# Patient Record
Sex: Female | Born: 1970 | Race: White | Hispanic: No | Marital: Married | Smoking: Never smoker
Health system: Southern US, Community
[De-identification: ages and names within clinical notes are randomized; demographics above are authoritative.]

## PROBLEM LIST (undated history)

## (undated) DIAGNOSIS — I10 Essential (primary) hypertension: Secondary | ICD-10-CM

## (undated) DIAGNOSIS — E039 Hypothyroidism, unspecified: Secondary | ICD-10-CM

## (undated) DIAGNOSIS — Z803 Family history of malignant neoplasm of breast: Secondary | ICD-10-CM

## (undated) DIAGNOSIS — M1990 Unspecified osteoarthritis, unspecified site: Secondary | ICD-10-CM

## (undated) HISTORY — DX: Family history of malignant neoplasm of breast: Z80.3

---

## 1999-10-01 ENCOUNTER — Other Ambulatory Visit: Admission: RE | Admit: 1999-10-01 | Discharge: 1999-10-01 | Payer: Self-pay | Admitting: Obstetrics and Gynecology

## 2000-10-16 ENCOUNTER — Other Ambulatory Visit: Admission: RE | Admit: 2000-10-16 | Discharge: 2000-10-16 | Payer: Self-pay | Admitting: Obstetrics and Gynecology

## 2001-10-19 ENCOUNTER — Other Ambulatory Visit: Admission: RE | Admit: 2001-10-19 | Discharge: 2001-10-19 | Payer: Self-pay | Admitting: Obstetrics and Gynecology

## 2004-10-05 ENCOUNTER — Emergency Department (HOSPITAL_COMMUNITY): Admission: EM | Admit: 2004-10-05 | Discharge: 2004-10-05 | Payer: Self-pay | Admitting: Family Medicine

## 2005-10-06 ENCOUNTER — Encounter: Admission: RE | Admit: 2005-10-06 | Discharge: 2005-10-06 | Payer: Self-pay | Admitting: Endocrinology

## 2006-02-14 ENCOUNTER — Emergency Department (HOSPITAL_COMMUNITY): Admission: EM | Admit: 2006-02-14 | Discharge: 2006-02-14 | Payer: Self-pay | Admitting: Emergency Medicine

## 2006-03-02 ENCOUNTER — Encounter: Admission: RE | Admit: 2006-03-02 | Discharge: 2006-03-02 | Payer: Self-pay | Admitting: Family Medicine

## 2013-09-09 ENCOUNTER — Other Ambulatory Visit (INDEPENDENT_AMBULATORY_CARE_PROVIDER_SITE_OTHER): Payer: BC Managed Care – PPO

## 2013-09-09 ENCOUNTER — Ambulatory Visit (INDEPENDENT_AMBULATORY_CARE_PROVIDER_SITE_OTHER): Payer: BC Managed Care – PPO | Admitting: Endocrinology

## 2013-09-09 ENCOUNTER — Encounter: Payer: Self-pay | Admitting: Endocrinology

## 2013-09-09 VITALS — BP 110/70 | HR 93 | Temp 98.2°F | Resp 12 | Ht 63.0 in | Wt 240.7 lb

## 2013-09-09 DIAGNOSIS — R7309 Other abnormal glucose: Secondary | ICD-10-CM

## 2013-09-09 DIAGNOSIS — E039 Hypothyroidism, unspecified: Secondary | ICD-10-CM

## 2013-09-09 DIAGNOSIS — E282 Polycystic ovarian syndrome: Secondary | ICD-10-CM

## 2013-09-09 DIAGNOSIS — E785 Hyperlipidemia, unspecified: Secondary | ICD-10-CM

## 2013-09-09 DIAGNOSIS — R7303 Prediabetes: Secondary | ICD-10-CM

## 2013-09-09 LAB — COMPREHENSIVE METABOLIC PANEL
ALT: 23 U/L (ref 0–35)
Albumin: 4.2 g/dL (ref 3.5–5.2)
CO2: 27 mEq/L (ref 19–32)
Calcium: 9.8 mg/dL (ref 8.4–10.5)
Chloride: 102 mEq/L (ref 96–112)
Creatinine, Ser: 0.7 mg/dL (ref 0.4–1.2)
GFR: 93.08 mL/min (ref >60.00)
Glucose, Bld: 109 mg/dL — ABNORMAL HIGH (ref 70–99)
Sodium: 139 mEq/L (ref 135–145)
Total Bilirubin: 0.6 mg/dL (ref 0.3–1.2)
Total Protein: 7.7 g/dL (ref 6.0–8.3)

## 2013-09-09 LAB — LIPID PANEL
Cholesterol: 173 mg/dL (ref 0–200)
LDL Cholesterol: 102 mg/dL — ABNORMAL HIGH (ref 0–99)
Total CHOL/HDL Ratio: 5
VLDL: 34.2 mg/dL (ref 0.0–40.0)

## 2013-09-09 LAB — HEMOGLOBIN A1C: Hgb A1c MFr Bld: 5.5 % (ref 4.6–6.5)

## 2013-09-09 NOTE — Progress Notes (Signed)
Patient ID: Nicole Schwartz, female   DOB: 1971/02/05, 42 y.o.   MRN: [id number]  Nicole Schwartz is a 42 y.o. female.   Chief complaint: Followup of various conditions  History of Present Illness:  HYPERTENSION: She has had hypertension since at least 2004. Has been on ACE inhibitor since about 2005 Now requiring relatively low doses of medications. On her last visit she was having some swelling of her legs and her ACE inhibitor was changed to low-dose Lotensin HCTZ. Blood pressure at work was 115/70. She has no edema now with new medication  HYPOTHYROIDISM: She has had a goiter since at least 1998. Baseline TSH was 5.4 when she was started on levothyroxine in 2000 and has required small doses of supplement  PCOS.: She had oligomenorrhea and was treated with metformin since about 2002. She went off medications about 2 years ago and still continues to have regular menstrual cycles. Her last free testosterone level was normal  OBESITY: She has had difficulty losing weight, currently not exercising consistently. She follows a low-fat diet off and on. Has not been previously on weight loss medications  HYPERLIPIDEMIA: She has had diabetic dyslipidemia with HDL as low as 23 and high triglycerides. Previously on Crestor and fenofibrate combination, subsequently levels were better without treatment    Medication List       This list is accurate as of: 09/09/13 10:07 AM.  Always use your most recent med list.               benazepril-hydrochlorthiazide 5-6.25 MG per tablet  Commonly known as:  LOTENSIN HCT     SYNTHROID 75 MCG tablet  Generic drug:  levothyroxine  75 tablets.        Allergies: No Known Allergies  No past medical history on file.  No past surgical history on file.  No family history on file.  Social History:  reports that she has never smoked. She has never used smokeless tobacco. Her alcohol and drug histories are not on file.  Review of Systems   No recent  depression  LABS:  No results found for any previous visit.  EXAM:  BP 110/70  Pulse 93  Temp(Src) 98.2 F (36.8 C)  Resp 12  Ht 5\' 3"  (1.6 m)  Wt 240 lb 11.2 oz (109.181 kg)  BMI 42.65 kg/m2  SpO2 97%  LMP 08/21/2013  Does not have any cushingoid features No pedal edema Thyroid is enlarged about twice normal, diffuse, larger on the right side, smooth and rubbery Ankle reflexes normal  Assessment/Plan:   1. Long-standing mild hypothyroidism with goiter: Has been previously on stable doses of thyroid supplementation and will check TSH again to confirm. She is subjectively doing well. Her goiter is about the same as before and not have any local pressure symptoms 2. OBESITY: She has had difficulty losing weight consistently and does have features of metabolic syndrome, more recently has not had any significant hyperglycemia or hyperlipidemia. However she is unable to lose weight on her own. Discussed briefly that she could use Qsymia for weight loss and this can be used long-term especially if she has abnormal glucose or significantly high triglyceride levels 3. Hyperlipidemia: To be reassessed with fasting labs today 4. PCOS.: Has not had a problem with skipped menstrual cycles as before and previous testosterone levels had come down to normal. May consider metformin if fasting glucose is high 5. Hypertension: This is mild and well controlled, she is monitoring periodically at work also. Her edema is  doing better with combination of low-dose ACE inhibitor and HCTZ.  Dantrell Schertzer 09/09/2013, 10:07 AM   Addendum: Glucose 109 fasting, will start metformin ER 750 mg, this may help her weight loss too

## 2013-09-09 NOTE — Patient Instructions (Signed)
Check Qsymia for wt loss

## 2013-09-12 ENCOUNTER — Telehealth: Payer: Self-pay | Admitting: *Deleted

## 2013-09-12 ENCOUNTER — Encounter: Payer: Self-pay | Admitting: Endocrinology

## 2013-09-12 MED ORDER — METFORMIN HCL ER 750 MG PO TB24: 750.0000 mg | ORAL_TABLET | Freq: Every day | ORAL | Status: DC

## 2013-09-12 MED ORDER — BENAZEPRIL-HYDROCHLOROTHIAZIDE 5-6.25 MG PO TABS: 1.0000 | ORAL_TABLET | Freq: Every day | ORAL | Status: DC

## 2013-09-12 NOTE — Telephone Encounter (Signed)
Left message to return call 

## 2013-09-12 NOTE — Telephone Encounter (Signed)
Message copied by Hermenia Bers on Mon Sep 12, 2013  1:39 PM ------      Message from: Reather Littler      Created: Mon Sep 12, 2013  1:10 PM       Only has a high sugar level of 109, the rest normal. Recommend starting metformin ER 750 mg daily to help with sugar and may help with weight. ------

## 2013-11-14 ENCOUNTER — Other Ambulatory Visit: Payer: Self-pay | Admitting: *Deleted

## 2013-11-14 MED ORDER — LEVOTHYROXINE SODIUM 75 MCG PO TABS: 75.0000 ug | ORAL_TABLET | Freq: Every day | ORAL | Status: DC

## 2014-03-07 ENCOUNTER — Other Ambulatory Visit (INDEPENDENT_AMBULATORY_CARE_PROVIDER_SITE_OTHER): Payer: BC Managed Care – PPO

## 2014-03-07 DIAGNOSIS — E039 Hypothyroidism, unspecified: Secondary | ICD-10-CM

## 2014-03-07 LAB — T4, FREE: Free T4: 0.81 ng/dL (ref 0.60–1.60)

## 2014-03-07 LAB — TSH: TSH: 1.6 u[IU]/mL (ref 0.35–5.50)

## 2014-03-10 ENCOUNTER — Ambulatory Visit: Payer: BC Managed Care – PPO

## 2014-03-10 ENCOUNTER — Ambulatory Visit (INDEPENDENT_AMBULATORY_CARE_PROVIDER_SITE_OTHER): Payer: BC Managed Care – PPO | Admitting: Endocrinology

## 2014-03-10 ENCOUNTER — Other Ambulatory Visit: Payer: Self-pay | Admitting: Endocrinology

## 2014-03-10 ENCOUNTER — Encounter: Payer: Self-pay | Admitting: Endocrinology

## 2014-03-10 VITALS — BP 110/64 | HR 96 | Temp 97.7°F | Resp 16 | Ht 63.0 in | Wt 242.6 lb

## 2014-03-10 DIAGNOSIS — R7309 Other abnormal glucose: Secondary | ICD-10-CM

## 2014-03-10 DIAGNOSIS — E1059 Type 1 diabetes mellitus with other circulatory complications: Secondary | ICD-10-CM

## 2014-03-10 DIAGNOSIS — E785 Hyperlipidemia, unspecified: Secondary | ICD-10-CM

## 2014-03-10 DIAGNOSIS — R7303 Prediabetes: Secondary | ICD-10-CM

## 2014-03-10 DIAGNOSIS — E039 Hypothyroidism, unspecified: Secondary | ICD-10-CM

## 2014-03-10 LAB — GLUCOSE, RANDOM: Glucose, Bld: 92 mg/dL (ref 70–99)

## 2014-03-10 NOTE — Progress Notes (Signed)
Patient ID: Nicole Schwartz, female   DOB: [birth year], 43 y.o.   MRN: [id number]    Chief complaint: Followup of various conditions  History of Present Illness:  HYPERTENSION: She has had hypertension since at least 2004. Has been on ACE inhibitor since about 2005 Now requiring relatively low doses of medications. Because of swelling of her legs  her ACE inhibitor was changed to low-dose Lotensin HCTZ. Blood pressure at work is about 116/75. She has no recurrence of edema   IMPAIRED FASTING GLUCOSE: Her glucose was high at 109 on her last visit She was started on metformin ER 750 mg and fasting glucose is pending  HYPOTHYROIDISM: She has had a goiter since at least 1998. Baseline TSH was 5.4 when she was started on levothyroxine in 2000 and has required small doses of levothyroxine with stable doses. No fatigue  Lab Results  Component Value Date   TSH 1.60 03/07/2014    PCOS.: She had oligomenorrhea and was treated with metformin since about 2002. She went off medications about 2 years ago and still continues to have regular menstrual cycles. Her last free testosterone level was normal  OBESITY: She has had difficulty losing weight, currently not exercising and not motivated. She follows a low-fat diet off and on.  Has not been previously on weight loss medications Her weight is not any better with starting metformin  Wt Readings from Last 3 Encounters:  03/10/14 242 lb 9.6 oz (110.043 kg)  09/09/13 240 lb 11.2 oz (109.181 kg)    HYPERLIPIDEMIA: She has had diabetic dyslipidemia with HDL as low as 23 and high triglycerides. Previously on Crestor and fenofibrate combination, subsequently levels were better without treatment  Lab Results  Component Value Date   CHOL 173 09/09/2013   HDL 36.80* 09/09/2013   LDLCALC 102* 09/09/2013   TRIG 171.0* 09/09/2013   CHOLHDL 5 09/09/2013       Medication List       This list is accurate as of: 03/10/14  4:22 PM.  Always use your most  recent med list.               benazepril-hydrochlorthiazide 5-6.25 MG per tablet  Commonly known as:  LOTENSIN HCT  TAKE 1 TABLET BY MOUTH DAILY.     levothyroxine 75 MCG tablet  Commonly known as:  SYNTHROID  Take 1 tablet (75 mcg total) by mouth daily before breakfast.     metFORMIN 750 MG 24 hr tablet  Commonly known as:  GLUCOPHAGE-XR  Take 1 tablet (750 mg total) by mouth daily with breakfast.        Allergies: No Known Allergies  No past medical history on file.  No past surgical history on file.  Family History  Problem Relation Age of Onset  . Thyroid disease Mother   . Diabetes Maternal Grandfather   . Hypertension Maternal Grandfather     Social History:  reports that she has never smoked. She has never used smokeless tobacco. Her alcohol and drug histories are not on file.  Review of Systems   No recent depression   EXAM:  BP 110/64  Pulse 96  Temp(Src) 97.7 F (36.5 C)  Resp 16  Ht 5\' 3"  (1.6 m)  Wt 242 lb 9.6 oz (110.043 kg)  BMI 42.99 kg/m2  SpO2 95%  LMP 02/20/2014   Assessment/Plan:   1. Long-standing mild hypothyroidism with goiter: Has been previously on stable doses of thyroid supplementation and  TSH is again normal on 75 mcg.  Also has small persistent goiter 2. OBESITY: She has had difficulty losing weight consistently and does have features of metabolic syndrome including dyslipidemia and impaired fasting glucose. She is reluctant to start a weight loss medication because of the cost and wants to try harder on her own. 3. Hyperlipidemia: Mild increase in triglycerides and low HDL previously, may improve with weight loss 4. PCOS.: Has not had a problem with skipped menstrual cycles as before and previous testosterone levels had come down to normal.  5. Hypertension: This is mild and well controlled, she is monitoring periodically at work also. Her edema is doing better with combination of low-dose ACE inhibitor and HCTZ.  Elayne Snare 03/10/2014, 4:22 PM   Addendum: Glucose normal

## 2014-03-11 LAB — COMPLETE METABOLIC PANEL WITH GFR
ALK PHOS: 51 U/L (ref 39–117)
ALT: 25 U/L (ref 0–35)
AST: 23 U/L (ref 0–37)
Albumin: 4.2 g/dL (ref 3.5–5.2)
BUN: 12 mg/dL (ref 6–23)
CO2: 19 mEq/L (ref 19–32)
CREATININE: 0.73 mg/dL (ref 0.50–1.10)
Calcium: 9.5 mg/dL (ref 8.4–10.5)
Chloride: 101 mEq/L (ref 96–112)
GFR, Est African American: >89 mL/min
GFR, Est Non African American: >89 mL/min
GLUCOSE: 89 mg/dL (ref 70–99)
Potassium: 4.3 mEq/L (ref 3.5–5.3)
SODIUM: 136 meq/L (ref 135–145)
TOTAL PROTEIN: 7.3 g/dL (ref 6.0–8.3)
Total Bilirubin: 0.4 mg/dL (ref 0.2–1.2)

## 2014-03-12 NOTE — Progress Notes (Signed)
Quick Note:  Please let patient know that the glucose result is back to normal and no further action needed ______

## 2014-05-03 ENCOUNTER — Ambulatory Visit: Payer: BC Managed Care – PPO | Admitting: Dietician

## 2014-06-13 ENCOUNTER — Ambulatory Visit: Payer: BC Managed Care – PPO | Admitting: Dietician

## 2014-07-05 ENCOUNTER — Other Ambulatory Visit (INDEPENDENT_AMBULATORY_CARE_PROVIDER_SITE_OTHER): Payer: BC Managed Care – PPO

## 2014-07-05 DIAGNOSIS — E039 Hypothyroidism, unspecified: Secondary | ICD-10-CM

## 2014-07-05 DIAGNOSIS — R7989 Other specified abnormal findings of blood chemistry: Secondary | ICD-10-CM

## 2014-07-05 DIAGNOSIS — R7303 Prediabetes: Secondary | ICD-10-CM

## 2014-07-05 DIAGNOSIS — R7309 Other abnormal glucose: Secondary | ICD-10-CM

## 2014-07-05 DIAGNOSIS — E785 Hyperlipidemia, unspecified: Secondary | ICD-10-CM

## 2014-07-05 LAB — LIPID PANEL
Cholesterol: 164 mg/dL (ref 0–200)
HDL: 30.8 mg/dL — AB (ref >39.00)
NONHDL: 133.2
Total CHOL/HDL Ratio: 5
Triglycerides: 279 mg/dL — ABNORMAL HIGH (ref 0.0–149.0)
VLDL: 55.8 mg/dL — AB (ref 0.0–40.0)

## 2014-07-05 LAB — COMPREHENSIVE METABOLIC PANEL
ALT: 24 U/L (ref 0–35)
AST: 21 U/L (ref 0–37)
Albumin: 4.1 g/dL (ref 3.5–5.2)
Alkaline Phosphatase: 64 U/L (ref 39–117)
BUN: 11 mg/dL (ref 6–23)
CALCIUM: 9.3 mg/dL (ref 8.4–10.5)
CO2: 26 mEq/L (ref 19–32)
CREATININE: 0.7 mg/dL (ref 0.4–1.2)
Chloride: 100 mEq/L (ref 96–112)
GFR: 97.31 mL/min (ref >60.00)
Glucose, Bld: 94 mg/dL (ref 70–99)
Potassium: 3.7 mEq/L (ref 3.5–5.1)
Sodium: 133 mEq/L — ABNORMAL LOW (ref 135–145)
TOTAL PROTEIN: 7.7 g/dL (ref 6.0–8.3)
Total Bilirubin: 0.8 mg/dL (ref 0.2–1.2)

## 2014-07-05 LAB — LDL CHOLESTEROL, DIRECT: LDL DIRECT: 104 mg/dL

## 2014-07-05 LAB — TSH: TSH: 0.94 u[IU]/mL (ref 0.35–4.50)

## 2014-07-05 LAB — HEMOGLOBIN A1C: HEMOGLOBIN A1C: 5.4 % (ref 4.6–6.5)

## 2014-07-10 ENCOUNTER — Encounter: Payer: Self-pay | Admitting: Endocrinology

## 2014-07-10 ENCOUNTER — Ambulatory Visit (INDEPENDENT_AMBULATORY_CARE_PROVIDER_SITE_OTHER): Payer: BC Managed Care – PPO | Admitting: Endocrinology

## 2014-07-10 VITALS — BP 118/64 | HR 87 | Temp 98.1°F | Resp 16 | Ht 63.0 in | Wt 239.0 lb

## 2014-07-10 DIAGNOSIS — R7309 Other abnormal glucose: Secondary | ICD-10-CM

## 2014-07-10 DIAGNOSIS — R7303 Prediabetes: Secondary | ICD-10-CM

## 2014-07-10 DIAGNOSIS — E039 Hypothyroidism, unspecified: Secondary | ICD-10-CM

## 2014-07-10 DIAGNOSIS — I10 Essential (primary) hypertension: Secondary | ICD-10-CM

## 2014-07-10 DIAGNOSIS — E785 Hyperlipidemia, unspecified: Secondary | ICD-10-CM

## 2014-07-10 NOTE — Progress Notes (Signed)
Patient ID: Nicole Schwartz, female   DOB: May 22, 1971, 43 y.o.   MRN: [id number]    Chief complaint: Followup of various conditions  History of Present Illness:  HYPERTENSION: She has had hypertension since at least 2004. Has been on ACE inhibitor since about 2005 Still requiring relatively low doses of medications.  Because of swelling of her legs her ACE inhibitor was changed to low-dose Lotensin HCTZ. Blood pressure at work is about 110-120/68-70 and is checking this once or twice a week  IMPAIRED FASTING GLUCOSE: Her glucose was high at 109 previously but back to normal now at 94 She was started on metformin ER 750 mg and A1c is excellent Some walking on treadmill 2-3/7 days but has minimal weight loss  Lab Results  Component Value Date   HGBA1C 5.4 07/05/2014   HGBA1C 5.5 09/09/2013   Lab Results  Component Value Date   LDLCALC 102* 09/09/2013   CREATININE 0.7 07/05/2014    HYPOTHYROIDISM: She has had a goiter since at least 1998. Baseline TSH was 5.4 when she was started on levothyroxine in 2000 and has required small doses of levothyroxine with stable doses. No fatigue recently  Lab Results  Component Value Date   FREET4 0.81 03/07/2014   FREET4 0.84 09/09/2013   TSH 0.94 07/05/2014   TSH 1.60 03/07/2014   TSH 1.09 09/09/2013    PCOS.: She had oligomenorrhea and was treated with metformin since about 2002. She went off medications in ? 2012 and still continues to have regular menstrual cycles. Her last free testosterone level was normal  OBESITY: She has had difficulty losing weight, starting to do a little exercise. She follows a low-fat diet off and on.  Has not been previously on weight loss medications and does not want to go on these because of expense   Wt Readings from Last 3 Encounters:  07/10/14 239 lb (108.41 kg)  03/10/14 242 lb 9.6 oz (110.043 kg)  09/09/13 240 lb 11.2 oz (109.181 kg)    HYPERLIPIDEMIA: She has had diabetic dyslipidemia with HDL as low as 23 and  high triglycerides. Previously on Crestor and fenofibrate combination, subsequently levels were better without treatment but triglycerides are significantly higher on this visit Diet usually low fat   Lab Results  Component Value Date   CHOL 164 07/05/2014   HDL 30.80* 07/05/2014   LDLCALC 102* 09/09/2013   LDLDIRECT 104.0 07/05/2014   TRIG 279.0* 07/05/2014   CHOLHDL 5 07/05/2014       Medication List       This list is accurate as of: 07/10/14  4:30 PM.  Always use your most recent med list.               benazepril-hydrochlorthiazide 5-6.25 MG per tablet  Commonly known as:  LOTENSIN HCT  TAKE 1 TABLET BY MOUTH DAILY.     levothyroxine 75 MCG tablet  Commonly known as:  SYNTHROID  Take 1 tablet (75 mcg total) by mouth daily before breakfast.     metFORMIN 750 MG 24 hr tablet  Commonly known as:  GLUCOPHAGE-XR  Take 1 tablet (750 mg total) by mouth daily with breakfast.        Allergies: No Known Allergies  No past medical history on file.  No past surgical history on file.  Family History  Problem Relation Age of Onset  . Thyroid disease Mother   . Diabetes Maternal Grandfather   . Hypertension Maternal Grandfather     Social History:  reports  that she has never smoked. She has never used smokeless tobacco. Her alcohol and drug histories are not on file.  Review of Systems   No recent depression   EXAM:  BP 122/88  Pulse 87  Temp(Src) 98.1 F (36.7 C)  Resp 16  Ht 5\' 3"  (1.6 m)  Wt 239 lb (108.41 kg)  BMI 42.35 kg/m2  SpO2 98%  Thyroid is enlarged diffusely, smooth and rubbery, about 2 a half times normal  Assessment/Plan:   1. Long-standing mild hypothyroidism with goiter: Has been on stable doses of thyroid supplementation and  TSH is again normal on 75 mcg. Also has small persistent goiter 2. OBESITY: She has had difficulty losing weight consistently and does have features of metabolic syndrome including dyslipidemia and impaired fasting  glucose. She is exercising a little but has not had much luck with weight loss yet. 3. Hyperlipidemia: Further increase in triglycerides and low HDL. Will need to get her back on fenofibrate 4. PCOS.: Has not had a problem with skipped menstrual cycles as before and previous testosterone levels had come down to normal.  5. Impaired fasting glucose: Controlled with low dose metformin and will continue. Fasting glucose recently normal 6. Hypertension: This is mild and well controlled, she is monitoring periodically at work also. Her edema is doing better with combination of low-dose ACE inhibitor and HCTZ.  Nicole Schwartz 07/10/2014, 4:30 PM

## 2014-07-10 NOTE — Patient Instructions (Addendum)
Fenofibrate for triglycerides  Walk more

## 2014-07-11 DIAGNOSIS — I10 Essential (primary) hypertension: Secondary | ICD-10-CM | POA: Insufficient documentation

## 2014-07-11 MED ORDER — FENOFIBRATE MICRONIZED 134 MG PO CAPS: 134.0000 mg | ORAL_CAPSULE | Freq: Every day | ORAL | Status: DC

## 2014-09-12 ENCOUNTER — Ambulatory Visit (HOSPITAL_COMMUNITY)
Admission: RE | Admit: 2014-09-12 | Discharge: 2014-09-12 | Disposition: A | Discharge status: Home / Self Care | Payer: BC Managed Care – PPO | Source: Ambulatory Visit | Attending: Physician Assistant | Admitting: Physician Assistant

## 2014-09-12 ENCOUNTER — Other Ambulatory Visit (HOSPITAL_COMMUNITY): Payer: Self-pay | Admitting: Physician Assistant

## 2014-09-12 DIAGNOSIS — M79621 Pain in right upper arm: Secondary | ICD-10-CM | POA: Insufficient documentation

## 2014-09-12 DIAGNOSIS — M79609 Pain in unspecified limb: Secondary | ICD-10-CM

## 2014-09-12 DIAGNOSIS — M25521 Pain in right elbow: Secondary | ICD-10-CM

## 2014-09-12 NOTE — Progress Notes (Signed)
*  PRELIMINARY RESULTS* Vascular Ultrasound Right upper extremity venous duplex has been completed.  Preliminary findings:  No evidence of DVT or superficial thrombosis.    Called results to Ricka Burdock, [facility], RDMS, RVT  09/12/2014, 12:39 PM

## 2014-09-17 ENCOUNTER — Other Ambulatory Visit: Payer: Self-pay | Admitting: Endocrinology

## 2014-09-20 ENCOUNTER — Telehealth: Payer: Self-pay | Admitting: Endocrinology

## 2014-09-20 NOTE — Telephone Encounter (Signed)
Amber from [facility] blood pressure medication can't refill now they may have to give for now can't get milligram in stock been trying to order the past 3 days [phone]

## 2014-09-21 ENCOUNTER — Other Ambulatory Visit: Payer: Self-pay | Admitting: *Deleted

## 2014-09-21 MED ORDER — LISINOPRIL-HYDROCHLOROTHIAZIDE 10-12.5 MG PO TABS: 1.0000 | ORAL_TABLET | Freq: Every day | ORAL | Status: DC

## 2014-11-06 ENCOUNTER — Other Ambulatory Visit (INDEPENDENT_AMBULATORY_CARE_PROVIDER_SITE_OTHER): Payer: BC Managed Care – PPO

## 2014-11-06 DIAGNOSIS — I10 Essential (primary) hypertension: Secondary | ICD-10-CM

## 2014-11-06 DIAGNOSIS — E785 Hyperlipidemia, unspecified: Secondary | ICD-10-CM

## 2014-11-06 LAB — LIPID PANEL
Cholesterol: 174 mg/dL (ref 0–200)
HDL: 41.2 mg/dL (ref >39.00)
NonHDL: 132.8
Total CHOL/HDL Ratio: 4
Triglycerides: 250 mg/dL — ABNORMAL HIGH (ref 0.0–149.0)
VLDL: 50 mg/dL — ABNORMAL HIGH (ref 0.0–40.0)

## 2014-11-06 LAB — COMPREHENSIVE METABOLIC PANEL
ALT: 14 U/L (ref 0–35)
AST: 10 U/L (ref 0–37)
Albumin: 3.9 g/dL (ref 3.5–5.2)
Alkaline Phosphatase: 56 U/L (ref 39–117)
BUN: 13 mg/dL (ref 6–23)
CO2: 30 meq/L (ref 19–32)
CREATININE: 0.7 mg/dL (ref 0.4–1.2)
Calcium: 9.2 mg/dL (ref 8.4–10.5)
Chloride: 99 mEq/L (ref 96–112)
GFR: 92.56 mL/min (ref >60.00)
Glucose, Bld: 94 mg/dL (ref 70–99)
Potassium: 3.6 mEq/L (ref 3.5–5.1)
Sodium: 136 mEq/L (ref 135–145)
Total Bilirubin: 0.7 mg/dL (ref 0.2–1.2)
Total Protein: 6.9 g/dL (ref 6.0–8.3)

## 2014-11-06 LAB — LDL CHOLESTEROL, DIRECT: LDL DIRECT: 109.3 mg/dL

## 2014-11-09 ENCOUNTER — Ambulatory Visit: Payer: BC Managed Care – PPO | Admitting: Endocrinology

## 2014-11-11 ENCOUNTER — Other Ambulatory Visit: Payer: Self-pay | Admitting: Endocrinology

## 2014-11-14 ENCOUNTER — Encounter: Payer: Self-pay | Admitting: Endocrinology

## 2014-11-14 ENCOUNTER — Ambulatory Visit (INDEPENDENT_AMBULATORY_CARE_PROVIDER_SITE_OTHER): Payer: BC Managed Care – PPO | Admitting: Endocrinology

## 2014-11-14 ENCOUNTER — Other Ambulatory Visit: Payer: Self-pay | Admitting: Endocrinology

## 2014-11-14 VITALS — BP 134/96 | HR 102 | Temp 98.2°F | Resp 16 | Ht 63.0 in | Wt 246.2 lb

## 2014-11-14 DIAGNOSIS — E063 Autoimmune thyroiditis: Secondary | ICD-10-CM | POA: Insufficient documentation

## 2014-11-14 DIAGNOSIS — E782 Mixed hyperlipidemia: Secondary | ICD-10-CM

## 2014-11-14 DIAGNOSIS — I10 Essential (primary) hypertension: Secondary | ICD-10-CM

## 2014-11-14 DIAGNOSIS — E038 Other specified hypothyroidism: Secondary | ICD-10-CM

## 2014-11-14 MED ORDER — LISINOPRIL-HYDROCHLOROTHIAZIDE 10-12.5 MG PO TABS: 1.0000 | ORAL_TABLET | Freq: Every day | ORAL | Status: DC

## 2014-11-14 NOTE — Progress Notes (Signed)
Patient ID: Nicole Schwartz, female   DOB: 1971/01/19, 43 y.o.   MRN: [id number]    Chief complaint: Followup of various conditions  History of Present Illness:  HYPERTENSION: She has had hypertension since at least 2004. Has been on ACE inhibitor since about 2005 previouslyrequiring relatively low doses of medications.  Because of swelling of her legs her ACE inhibitor was changed to low-dose Lotensin HCTZ. For the last 2 months she thinks her blood pressure has been higher Blood pressure at work is about 130/80-90  Currently taking half of the 10/12.5 mg Zestoretic  IMPAIRED FASTING GLUCOSE: Her glucose was high at 109 previously and fasting readings now is consistent at 94 She was started on metformin ER 750 mg   A1c is excellent as of 8/15 Some walking on treadmill periodically but unable to lose weight  Lab Results  Component Value Date   HGBA1C 5.4 07/05/2014   HGBA1C 5.5 09/09/2013   Lab Results  Component Value Date   LDLCALC 102* 09/09/2013   CREATININE 0.7 11/06/2014    HYPOTHYROIDISM: She has had a goiter since at least 1998. Baseline TSH was 5.4 when she was started on levothyroxine in 2000 and has required small doses of levothyroxine Has not required a change in her 75 g dose was sometime.  She does tend to feel more tired but no cord intolerance  Lab Results  Component Value Date   FREET4 0.81 03/07/2014   FREET4 0.84 09/09/2013   TSH 0.94 07/05/2014   TSH 1.60 03/07/2014   TSH 1.09 09/09/2013    PCOS.: She had oligomenorrhea and was treated with metformin since about 2002. She went off medications in ? 2012.  Metformin was started because of her impaired fasting glucose and still continues to have regular menstrual cycles. Her last free testosterone level was normal  OBESITY: She has had difficulty losing weight even with some exercise. She follows a low-fat diet off and on.  Has not been previously on weight loss medications and is concerned about the cost  of these medications Recently appears to have gained weight with a couple of courses of prednisone  Wt Readings from Last 3 Encounters:  11/14/14 246 lb 3.2 oz (111.676 kg)  07/10/14 239 lb (108.41 kg)  03/10/14 242 lb 9.6 oz (110.043 kg)    HYPERLIPIDEMIA: She has had diabetic dyslipidemia with HDL as low as 23 and high triglycerides. Previously on Crestor and fenofibrate combination, subsequently LDL level has been better without treatment  Triglycerides are relatively higher again despite starting fenofibrate but has had some weight gain Diet usually low fat   Lab Results  Component Value Date   CHOL 174 11/06/2014   HDL 41.20 11/06/2014   LDLCALC 102* 09/09/2013   LDLDIRECT 109.3 11/06/2014   TRIG 250.0* 11/06/2014   CHOLHDL 4 11/06/2014       Medication List       This list is accurate as of: 11/14/14 11:28 AM.  Always use your most recent med list.               benazepril-hydrochlorthiazide 5-6.25 MG per tablet  Commonly known as:  LOTENSIN HCT  TAKE 1 TABLET BY MOUTH DAILY.     fenofibrate micronized 134 MG capsule  Commonly known as:  LOFIBRA  Take 1 capsule (134 mg total) by mouth daily before breakfast.     metFORMIN 750 MG 24 hr tablet  Commonly known as:  GLUCOPHAGE-XR  Take 1 tablet (750 mg total) by mouth daily  with breakfast.     SYNTHROID 75 MCG tablet  Generic drug:  levothyroxine  TAKE 1 TABLET (75 MCG TOTAL) BY MOUTH DAILY BEFORE BREAKFAST.        Allergies: No Known Allergies  No past medical history on file.  No past surgical history on file.  Family History  Problem Relation Age of Onset  . Thyroid disease Mother   . Diabetes Maternal Grandfather   . Hypertension Maternal Grandfather     Social History:  reports that she has never smoked. She has never used smokeless tobacco. Her alcohol and drug histories are not on file.  Review of Systems   No recent depression   She has been seeing her PCP on and off for the last 2  months for periodic swelling of her hands.  This is usually asymmetrical and sometimes only on one or 2 fingers associated with stiffness. Does not feel she is relieved by meloxicam or prednisone, has had prednisone twice for about a week at a time until 10 days ago  Hand on the left side may occasionally feel numb when she is sleeping   Labs:  No visits with results within 1 Week(s) from this visit. Latest known visit with results is:  Appointment on 11/06/2014  Component Date Value Ref Range Status  . Sodium 11/06/2014 136  135 - 145 mEq/L Final  . Potassium 11/06/2014 3.6  3.5 - 5.1 mEq/L Final  . Chloride 11/06/2014 99  96 - 112 mEq/L Final  . CO2 11/06/2014 30  19 - 32 mEq/L Final  . Glucose, Bld 11/06/2014 94  70 - 99 mg/dL Final  . BUN 11/06/2014 13  6 - 23 mg/dL Final  . Creatinine, Ser 11/06/2014 0.7  0.4 - 1.2 mg/dL Final  . Total Bilirubin 11/06/2014 0.7  0.2 - 1.2 mg/dL Final  . Alkaline Phosphatase 11/06/2014 56  39 - 117 U/L Final  . AST 11/06/2014 10  0 - 37 U/L Final  . ALT 11/06/2014 14  0 - 35 U/L Final  . Total Protein 11/06/2014 6.9  6.0 - 8.3 g/dL Final  . Albumin 11/06/2014 3.9  3.5 - 5.2 g/dL Final  . Calcium 11/06/2014 9.2  8.4 - 10.5 mg/dL Final  . GFR 11/06/2014 92.56  >60.00 mL/min Final  . Cholesterol 11/06/2014 174  0 - 200 mg/dL Final   ATP III Classification       Desirable:  < 200 mg/dL               Borderline High:  200 - 239 mg/dL          High:  > = 240 mg/dL  . Triglycerides 11/06/2014 250.0* 0.0 - 149.0 mg/dL Final   Normal:  <150 mg/dLBorderline High:  150 - 199 mg/dL  . HDL 11/06/2014 41.20  >39.00 mg/dL Final  . VLDL 11/06/2014 50.0* 0.0 - 40.0 mg/dL Final  . Total CHOL/HDL Ratio 11/06/2014 4   Final                  Men          Women1/2 Average Risk     3.4          3.3Average Risk          5.0          4.42X Average Risk          9.6          7.13X Average Risk  15.0          11.0                      . NonHDL 11/06/2014 132.80    Final   NOTE:  Non-HDL goal should be 30 mg/dL higher than patient's LDL goal (i.e. LDL goal of < 70 mg/dL, would have non-HDL goal of < 100 mg/dL)  . Direct LDL 11/06/2014 109.3   Final   Optimal:  <100 mg/dLNear or Above Optimal:  100-129 mg/dLBorderline High:  130-159 mg/dLHigh:  160-189 mg/dLVery High:  >190 mg/dL      EXAM:  BP 134/96 mmHg  Pulse 102  Temp(Src) 98.2 F (36.8 C)  Resp 16  Ht 5\' 3"  (1.6 m)  Wt 246 lb 3.2 oz (111.676 kg)  BMI 43.62 kg/m2  SpO2 98%     Assessment/Plan:   1. Long-standing mild hypothyroidism with goiter: Has been on stable doses of thyroid supplementation and  TSH was last checked in 8/15 and was normal on 75 mcg. Also has small persistent goiter 2. OBESITY: She has had difficulty losing weight consistently and does have features of metabolic syndrome including dyslipidemia and impaired fasting glucose. She is exercising a little but possibly because of using prednisone has gained weight.  She will again look into the cost of weight loss medications on her plan. 3. Hyperlipidemia: continued increase in triglycerides and low HDLdespite taking fenofibrate, does need weight loss 4. PCOS.: Has not had a problem with skipped menstrual cycles as before and previous testosterone levels had come down to normal.  5. Impaired fasting glucose: Controlled with low dose metformin and will continuethis. Fasting glucose again normal 6. Hypertension: This is recently not well controlled, probably from her being on meloxicam and prednisone. She is having some tendency to swelling occasionally in her feet and hands and will go up on her dose to full tablet of lisinopril HCT.  To follow-up in 6 weeks and will also check her TSH at that time along with BMP  [facility] 11/14/2014, 11:28 AM

## 2014-11-14 NOTE — Patient Instructions (Addendum)
Check coverage for Belviq and Qsymia  Take full Bp tab

## 2014-12-21 ENCOUNTER — Other Ambulatory Visit (INDEPENDENT_AMBULATORY_CARE_PROVIDER_SITE_OTHER): Payer: BC Managed Care – PPO

## 2014-12-21 DIAGNOSIS — I10 Essential (primary) hypertension: Secondary | ICD-10-CM

## 2014-12-21 DIAGNOSIS — E063 Autoimmune thyroiditis: Secondary | ICD-10-CM

## 2014-12-21 DIAGNOSIS — E038 Other specified hypothyroidism: Secondary | ICD-10-CM

## 2014-12-21 LAB — BASIC METABOLIC PANEL
BUN: 11 mg/dL (ref 6–23)
CHLORIDE: 100 meq/L (ref 96–112)
CO2: 24 mEq/L (ref 19–32)
Calcium: 9.9 mg/dL (ref 8.4–10.5)
Creatinine, Ser: 0.66 mg/dL (ref 0.40–1.20)
GFR: 103.92 mL/min (ref >60.00)
GLUCOSE: 117 mg/dL — AB (ref 70–99)
POTASSIUM: 3.7 meq/L (ref 3.5–5.1)
Sodium: 135 mEq/L (ref 135–145)

## 2014-12-21 LAB — TSH: TSH: 1.87 u[IU]/mL (ref 0.35–4.50)

## 2014-12-27 ENCOUNTER — Ambulatory Visit (INDEPENDENT_AMBULATORY_CARE_PROVIDER_SITE_OTHER): Payer: BC Managed Care – PPO | Admitting: Endocrinology

## 2014-12-27 ENCOUNTER — Encounter: Payer: Self-pay | Admitting: Endocrinology

## 2014-12-27 VITALS — BP 130/84 | HR 97 | Temp 98.5°F | Ht 63.0 in | Wt 250.0 lb

## 2014-12-27 DIAGNOSIS — R7309 Other abnormal glucose: Secondary | ICD-10-CM

## 2014-12-27 DIAGNOSIS — R7303 Prediabetes: Secondary | ICD-10-CM

## 2014-12-27 DIAGNOSIS — I10 Essential (primary) hypertension: Secondary | ICD-10-CM

## 2014-12-27 MED ORDER — PHENTERMINE-TOPIRAMATE ER 3.75-23 MG PO CP24: ORAL_CAPSULE | ORAL | Status: DC

## 2014-12-27 NOTE — Progress Notes (Signed)
Patient ID: Nicole Schwartz, female   DOB: June 20, 1971, 44 y.o.   MRN: [id number]    Chief complaint: Followup of various conditions  History of Present Illness:  HYPERTENSION: She has had hypertension since at least 2004. Has been on ACE inhibitor since about 2005 Usually requiring relatively low doses of medications.  Because of swelling of her legs her ACE inhibitor was changed to low-dose Lotensin HCTZ. More recently because of higher blood pressure readings and some swelling of her hands her Zestoretic was changed to the whole tablet Blood pressure at work is 134/87 recently, tends to be variable and does not appear to be much better  IMPAIRED FASTING GLUCOSE: Her glucose was high at 109 previously   She was started on metformin ER 750 mg   A1c is excellent as of 8/15 Recent glucose reading was nonfasting  Lab Results  Component Value Date   HGBA1C 5.4 07/05/2014   HGBA1C 5.5 09/09/2013   Lab Results  Component Value Date   LDLCALC 102* 09/09/2013   CREATININE 0.66 12/21/2014    HYPOTHYROIDISM: She has had a goiter since at least 1998. Baseline TSH was 5.4 when she was started on levothyroxine in 2000 and has required small doses of levothyroxine Has not required a change in her 75 g dose which has been consistent for quite some time   She does tend to feel tired at times  Lab Results  Component Value Date   TSH 1.87 12/21/2014   TSH 0.94 07/05/2014   TSH 1.60 03/07/2014   FREET4 0.81 03/07/2014   FREET4 0.84 09/09/2013    PCOS.: She had oligomenorrhea and was treated with metformin since about 2002. She went off medications in ? 2012.  Metformin was started because of her impaired fasting glucose and still continues to have regular menstrual cycles. Her last free testosterone level was normal  OBESITY: She has had difficulty losing weight even with some exercise. She follows a low-fat diet off and on.  Has not been previously on weight loss medications and has not  checked the coverage of her plan for [facility] weight loss medications discussed on her last visit Recently appears to have gained weight again  Wt Readings from Last 3 Encounters:  12/27/14 250 lb (113.399 kg)  11/14/14 246 lb 3.2 oz (111.676 kg)  07/10/14 239 lb (108.41 kg)    HYPERLIPIDEMIA: She has had diabetic dyslipidemia with HDL as low as 23 and high triglycerides. Previously on Crestor and fenofibrate combination, subsequently LDL level has been better without treatment  Triglycerides have been high despite starting fenofibrate   Diet has been variable recently   Lab Results  Component Value Date   CHOL 174 11/06/2014   HDL 41.20 11/06/2014   LDLCALC 102* 09/09/2013   LDLDIRECT 109.3 11/06/2014   TRIG 250.0* 11/06/2014   CHOLHDL 4 11/06/2014       Medication List       This list is accurate as of: 12/27/14  3:23 PM.  Always use your most recent med list.               fenofibrate micronized 134 MG capsule  Commonly known as:  LOFIBRA  Take 1 capsule (134 mg total) by mouth daily before breakfast.     lisinopril-hydrochlorothiazide 10-12.5 MG per tablet  Commonly known as:  PRINZIDE,ZESTORETIC  Take 1 tablet by mouth daily.     metFORMIN 750 MG 24 hr tablet  Commonly known as:  GLUCOPHAGE-XR  Take 1 tablet (750 mg  total) by mouth daily with breakfast.     Phentermine-Topiramate 3.75-23 MG Cp24  Commonly known as:  QSYMIA  1 tab qd     SYNTHROID 75 MCG tablet  Generic drug:  levothyroxine  TAKE 1 TABLET (75 MCG TOTAL) BY MOUTH DAILY BEFORE BREAKFAST.        Allergies: No Known Allergies  No past medical history on file.  No past surgical history on file.  Family History  Problem Relation Age of Onset  . Thyroid disease Mother   . Diabetes Maternal Grandfather   . Hypertension Maternal Grandfather     Social History:  reports that she has never smoked. She has never used smokeless tobacco. Her alcohol and drug histories are not on  file.  Review of Systems    She has been seeing her PCP  for periodic swelling of her hands.  This is usually asymmetrical and sometimes only on one or 2 fingers associated with stiffness. She has been referred to rheumatologist for possible lupus    Labs:  Appointment on 12/21/2014  Component Date Value Ref Range Status  . Sodium 12/21/2014 135  135 - 145 mEq/L Final  . Potassium 12/21/2014 3.7  3.5 - 5.1 mEq/L Final  . Chloride 12/21/2014 100  96 - 112 mEq/L Final  . CO2 12/21/2014 24  19 - 32 mEq/L Final  . Glucose, Bld 12/21/2014 117* 70 - 99 mg/dL Final  . BUN 12/21/2014 11  6 - 23 mg/dL Final  . Creatinine, Ser 12/21/2014 0.66  0.40 - 1.20 mg/dL Final  . Calcium 12/21/2014 9.9  8.4 - 10.5 mg/dL Final  . GFR 12/21/2014 103.92  >60.00 mL/min Final  . TSH 12/21/2014 1.87  0.35 - 4.50 uIU/mL Final      EXAM:  BP 130/84 mmHg  Pulse 97  Temp(Src) 98.5 F (36.9 C) (Oral)  Ht 5\' 3"  (1.6 m)  Wt 250 lb (113.399 kg)  BMI 44.30 kg/m2  SpO2 97%   Thyroid enlarged about twice normal, smooth and rubbery Biceps reflexes normal No pedal edema  Assessment/Plan:   1. Long-standing mild hypothyroidism with goiter: Has been on stable doses of thyroid supplementation and  TSH is normal again 2. OBESITY: Her weight is not 250 and she was advised to start weight loss medications with Qsymia.  May need prior authorization with this.  Discussed titration required.  Also needs to be consistent with diet and exercise and given her information on various exercises to do 3. Hypertension: This is not as well controlled but she thinks that this is partly related to her having discomfort in her hands and not sleeping consistently.  We will continue the same regimen now and hopefully with weight loss blood pressure will improve also 4. Impaired fasting glucose: We will need A1c on the next visit    Thijs Brunton 12/27/2014, 3:23 PM

## 2014-12-27 NOTE — Patient Instructions (Signed)
Qsymia 1x daily in ams

## 2015-02-07 ENCOUNTER — Ambulatory Visit (INDEPENDENT_AMBULATORY_CARE_PROVIDER_SITE_OTHER): Payer: BC Managed Care – PPO | Admitting: Endocrinology

## 2015-02-07 ENCOUNTER — Encounter: Payer: Self-pay | Admitting: Endocrinology

## 2015-02-07 VITALS — BP 126/88 | HR 88 | Temp 97.6°F | Resp 16 | Ht 63.0 in | Wt 252.8 lb

## 2015-02-07 DIAGNOSIS — R7309 Other abnormal glucose: Secondary | ICD-10-CM

## 2015-02-07 DIAGNOSIS — M0520 Rheumatoid vasculitis with rheumatoid arthritis of unspecified site: Secondary | ICD-10-CM | POA: Insufficient documentation

## 2015-02-07 DIAGNOSIS — E782 Mixed hyperlipidemia: Secondary | ICD-10-CM

## 2015-02-07 DIAGNOSIS — I10 Essential (primary) hypertension: Secondary | ICD-10-CM

## 2015-02-07 DIAGNOSIS — R7303 Prediabetes: Secondary | ICD-10-CM

## 2015-02-07 DIAGNOSIS — I00 Rheumatic fever without heart involvement: Secondary | ICD-10-CM

## 2015-02-07 NOTE — Patient Instructions (Signed)
Take 1 daily of Voltaren  Qsymia after dinner, call if tolerated and working

## 2015-02-07 NOTE — Progress Notes (Signed)
Patient ID: Nicole Schwartz, female   DOB: 09/17/1971, 44 y.o.   MRN: [id number]    Chief complaint: Followup of various conditions  History of Present Illness:   OBESITY: She has had difficulty losing weight even with some exercise. She follows a low-fat diet off and on.  She has difficulty being compliant with her exercise but recently she thinks she is trying to walk on her treadmill However has not been able to lose any weight and weight is trending higher gradually  On her last visit she was given prescription for Qsymia.  She took this 2 days but was taking it before breakfast and felt dizzy at lunchtime.  She stopped the medication and did not notify us  Wt Readings from Last 3 Encounters:  02/07/15 252 lb 12.8 oz (114.669 kg)  12/27/14 250 lb (113.399 kg)  11/14/14 246 lb 3.2 oz (111.676 kg)    HYPERTENSION: She has had hypertension since at least 2004. Has been on ACE inhibitor since about 2005 Usually requiring relatively low doses of medications.  Because of swelling of her legs her ACE inhibitor was changed to low-dose Lotensin HCTZ and subsequently to Zestoretic. Blood pressure at work recently was 119/73 Initial blood pressure was higher today in the office She is taking Voltaren 75 mg twice a day for her rheumatoid arthritis  IMPAIRED FASTING GLUCOSE: Her glucose was high at 109 previously   She has been  on metformin ER 750 mg   A1c has been quite normal Last glucose reading was 117, nonfasting  Lab Results  Component Value Date   HGBA1C 5.4 07/05/2014   HGBA1C 5.5 09/09/2013   Lab Results  Component Value Date   LDLCALC 102* 09/09/2013   CREATININE 0.66 12/21/2014    HYPOTHYROIDISM:  She has had a goiter since at least 1998. Baseline TSH was 5.4 when she was started on levothyroxine in 2000 and has required small doses of levothyroxine Has not required a change in her 75 g dose which has been consistent for quite some time     Lab Results  Component Value  Date   TSH 1.87 12/21/2014   TSH 0.94 07/05/2014   TSH 1.60 03/07/2014   FREET4 0.81 03/07/2014   FREET4 0.84 09/09/2013    PCOS.: She had oligomenorrhea and was treated with metformin since about 2002. She went off medications in ? 2012.   Metformin was started because of her impaired fasting glucose and still continues to have regular menstrual cycles.  Her last free testosterone level was normal   HYPERLIPIDEMIA: She has had diabetic dyslipidemia with HDL as low as 23 and high triglycerides. Previously on Crestor and fenofibrate combination, subsequently LDL level has been better without treatment  Triglycerides have been high despite starting fenofibrate   Also unable to lose weight recently  Lab Results  Component Value Date   CHOL 174 11/06/2014   HDL 41.20 11/06/2014   LDLCALC 102* 09/09/2013   LDLDIRECT 109.3 11/06/2014   TRIG 250.0* 11/06/2014   CHOLHDL 4 11/06/2014       Medication List       This list is accurate as of: 02/07/15  2:00 PM.  Always use your most recent med list.               diclofenac 75 MG EC tablet  Commonly known as:  VOLTAREN  Take 75 mg by mouth 2 (two) times daily.     fenofibrate micronized 134 MG capsule  Commonly known as:  LOFIBRA  Take 1 capsule (134 mg total) by mouth daily before breakfast.     lisinopril-hydrochlorothiazide 10-12.5 MG per tablet  Commonly known as:  PRINZIDE,ZESTORETIC  Take 1 tablet by mouth daily.     metFORMIN 750 MG 24 hr tablet  Commonly known as:  GLUCOPHAGE-XR  Take 1 tablet (750 mg total) by mouth daily with breakfast.     Phentermine-Topiramate 3.75-23 MG Cp24  Commonly known as:  QSYMIA  1 tab qd     SYNTHROID 75 MCG tablet  Generic drug:  levothyroxine  TAKE 1 TABLET (75 MCG TOTAL) BY MOUTH DAILY BEFORE BREAKFAST.        Allergies: No Known Allergies  No past medical history on file.  No past surgical history on file.  Family History  Problem Relation Age of Onset  . Thyroid  disease Mother   . Diabetes Maternal Grandfather   . Hypertension Maternal Grandfather     Social History:  reports that she has never smoked. She has never used smokeless tobacco. Her alcohol and drug histories are not on file.  Review of Systems    She did get a course of steroids again for hand swelling related to arthritis She has been told to have rheumatoid arthritis and will be starting methotrexate Currently taking Voltaren with no side effects    Labs:  No visits with results within 1 Week(s) from this visit. Latest known visit with results is:  Appointment on 12/21/2014  Component Date Value Ref Range Status  . Sodium 12/21/2014 135  135 - 145 mEq/L Final  . Potassium 12/21/2014 3.7  3.5 - 5.1 mEq/L Final  . Chloride 12/21/2014 100  96 - 112 mEq/L Final  . CO2 12/21/2014 24  19 - 32 mEq/L Final  . Glucose, Bld 12/21/2014 117* 70 - 99 mg/dL Final  . BUN 12/21/2014 11  6 - 23 mg/dL Final  . Creatinine, Ser 12/21/2014 0.66  0.40 - 1.20 mg/dL Final  . Calcium 12/21/2014 9.9  8.4 - 10.5 mg/dL Final  . GFR 12/21/2014 103.92  >60.00 mL/min Final  . TSH 12/21/2014 1.87  0.35 - 4.50 uIU/mL Final      EXAM:  BP 126/88 mmHg  Pulse 88  Temp(Src) 97.6 F (36.4 C)  Resp 16  Ht 5\' 3"  (1.6 m)  Wt 252 lb 12.8 oz (114.669 kg)  BMI 44.79 kg/m2  SpO2 97%     Assessment/Plan:   1. OBESITY: Her weight is over 250 and she has difficulty losing weight without medications.  She may have had side effects of dizziness with taking the medication before breakfast and she willing to try this again after supper and let us know how it works over the next 2 weeks.  Most likely if she tolerates this she can go up to the higher dose. Encouraged her to exercise consistently and be consistent with diet 2. Hypertension: This is not as well controlled again and may be related to her taking water and also; she can try taking Voltaren only once a day Since her blood pressure is fairly good  at work will not change her medication as yet 3. Impaired fasting glucose: We will need A1c on the next visit  Patient Instructions  Take 1 daily of Voltaren  Qsymia after dinner, call if tolerated and working       [facility] 02/07/2015, 2:00 PM

## 2015-03-13 ENCOUNTER — Other Ambulatory Visit: Payer: Self-pay | Admitting: Endocrinology

## 2015-03-18 ENCOUNTER — Other Ambulatory Visit: Payer: Self-pay | Admitting: Endocrinology

## 2015-04-04 ENCOUNTER — Other Ambulatory Visit (INDEPENDENT_AMBULATORY_CARE_PROVIDER_SITE_OTHER): Payer: BC Managed Care – PPO

## 2015-04-04 DIAGNOSIS — R7303 Prediabetes: Secondary | ICD-10-CM

## 2015-04-04 DIAGNOSIS — R7309 Other abnormal glucose: Secondary | ICD-10-CM | POA: Diagnosis not present

## 2015-04-04 DIAGNOSIS — E782 Mixed hyperlipidemia: Secondary | ICD-10-CM | POA: Diagnosis not present

## 2015-04-04 LAB — GLUCOSE, RANDOM: GLUCOSE: 97 mg/dL (ref 70–99)

## 2015-04-04 LAB — LIPID PANEL
CHOLESTEROL: 217 mg/dL — AB (ref 0–200)
HDL: 34.7 mg/dL — ABNORMAL LOW (ref >39.00)
Total CHOL/HDL Ratio: 6
Triglycerides: 413 mg/dL — ABNORMAL HIGH (ref 0.0–149.0)

## 2015-04-04 LAB — LDL CHOLESTEROL, DIRECT: Direct LDL: 106 mg/dL

## 2015-04-06 LAB — HEMOGLOBIN A1C: Hgb A1c MFr Bld: 5.3 % (ref 4.6–6.5)

## 2015-04-09 ENCOUNTER — Encounter: Payer: Self-pay | Admitting: Endocrinology

## 2015-04-09 ENCOUNTER — Ambulatory Visit (INDEPENDENT_AMBULATORY_CARE_PROVIDER_SITE_OTHER): Payer: BC Managed Care – PPO | Admitting: Endocrinology

## 2015-04-09 ENCOUNTER — Other Ambulatory Visit: Payer: Self-pay | Admitting: *Deleted

## 2015-04-09 DIAGNOSIS — R7301 Impaired fasting glucose: Secondary | ICD-10-CM

## 2015-04-09 DIAGNOSIS — E038 Other specified hypothyroidism: Secondary | ICD-10-CM | POA: Diagnosis not present

## 2015-04-09 DIAGNOSIS — E063 Autoimmune thyroiditis: Secondary | ICD-10-CM

## 2015-04-09 DIAGNOSIS — E782 Mixed hyperlipidemia: Secondary | ICD-10-CM

## 2015-04-09 MED ORDER — PHENTERMINE-TOPIRAMATE ER 3.75-23 MG PO CP24: ORAL_CAPSULE | ORAL | Status: DC

## 2015-04-09 MED ORDER — PHENTERMINE-TOPIRAMATE ER 7.5-46 MG PO CP24: ORAL_CAPSULE | ORAL | Status: DC

## 2015-04-09 NOTE — Progress Notes (Signed)
Patient ID: Nicole Schwartz, female   DOB: [birth year], 44 y.o.   MRN: [id number]    Chief complaint: Followup of various problems  History of Present Illness:   OBESITY: She has had difficulty losing weight  She follows a low-fat diet off and on.  She is trying to walk on her treadmill 3/7 However has not been able to lose any weight and weight is trending higher again  On her last visit she was tried on Qsymia and because of dizziness was told to take it in the evenings after several She was able to tolerate this at the end of her 14 days but did not call for a prescription for the higher dose.  Only tried the 3.75 mg dose and did not understand that this was just a starter prescription  Wt Readings from Last 3 Encounters:  04/09/15 256 lb (116.121 kg)  02/07/15 252 lb 12.8 oz (114.669 kg)  12/27/14 250 lb (113.399 kg)    HYPERTENSION: She has had hypertension since at least 2004. Has been on ACE inhibitor since about 2005 Usually requiring relatively low doses of medications.  Because of swelling of her legs her ACE inhibitor was changed to low-dose Lotensin HCTZ and subsequently to Zestoretic. Blood pressure at work ranges from 117/77 upto 125/87  Initial blood pressure was higher today in the office She is taking Voltaren 75 mg twice a day for her rheumatoid arthritis  IMPAIRED FASTING GLUCOSE: Her glucose was high at 109 previously   She has been  on metformin ER 750 mg   A1c has been quite normal Last glucose reading was 97 fasting   Lab Results  Component Value Date   HGBA1C 5.3 04/04/2015   HGBA1C 5.4 07/05/2014   HGBA1C 5.5 09/09/2013   Lab Results  Component Value Date   LDLCALC 102* 09/09/2013   CREATININE 0.66 12/21/2014    HYPOTHYROIDISM:  She has had a goiter since at least 1998. Baseline TSH was 5.4 when she was started on levothyroxine in 2000 and has required small doses of levothyroxine Has not required a change in her 75 g dose which has been consistent  for quite some time     Lab Results  Component Value Date   TSH 1.87 12/21/2014   TSH 0.94 07/05/2014   TSH 1.60 03/07/2014   FREET4 0.81 03/07/2014   FREET4 0.84 09/09/2013    PCOS:  She had oligomenorrhea and was treated with metformin since about 2002. She went off medications in ? 2012.   Metformin was started because of her impaired fasting glucose and still continues to have regular menstrual cycles.  Her last free testosterone level was normal   HYPERLIPIDEMIA: She has had diabetic dyslipidemia with HDL as low as 23 and high triglycerides. Previously on Crestor and fenofibrate combination, subsequently LDL level has been better without treatment  Triglycerides have been high despite starting fenofibrate and for some reason they are much higher now She has gained weight and without consistent undyed Also she thinks she forgets the medication at night sometimes     Lab Results  Component Value Date   CHOL 217* 04/04/2015   HDL 34.70* 04/04/2015   LDLCALC 102* 09/09/2013   LDLDIRECT 106.0 04/04/2015   TRIG * 04/04/2015    413.0 Triglyceride is over 400; calculations on Lipids are invalid.   CHOLHDL 6 04/04/2015       Medication List       This list is accurate as of: 04/09/15  5:05 PM.  Always use your most recent med list.               diclofenac 75 MG EC tablet  Commonly known as:  VOLTAREN  Take 75 mg by mouth 2 (two) times daily.     fenofibrate micronized 134 MG capsule  Commonly known as:  LOFIBRA  Take 1 capsule (134 mg total) by mouth daily before breakfast.     lisinopril-hydrochlorothiazide 10-12.5 MG per tablet  Commonly known as:  PRINZIDE,ZESTORETIC  Take 1 tablet by mouth daily.     metFORMIN 750 MG 24 hr tablet  Commonly known as:  GLUCOPHAGE-XR  TAKE 1 TABLET BY MOUTH DAILY WITH BREAKFAST     Phentermine-Topiramate 3.75-23 MG Cp24  Commonly known as:  QSYMIA  1 tab qd     Phentermine-Topiramate 7.5-46 MG Cp24  Take 1 capsule daily      SYNTHROID 75 MCG tablet  Generic drug:  levothyroxine  TAKE 1 TABLET (75 MCG TOTAL) BY MOUTH DAILY BEFORE BREAKFAST.        Allergies: No Known Allergies  No past medical history on file.  No past surgical history on file.  Family History  Problem Relation Age of Onset  . Thyroid disease Mother   . Diabetes Maternal Grandfather   . Hypertension Maternal Grandfather     Social History:  reports that she has never smoked. She has never used smokeless tobacco. Her alcohol and drug histories are not on file.  Review of Systems   She has been told to have rheumatoid arthritis and will be starting methotrexate Currently taking Voltaren with no side effects   Labs:  Lab on 04/04/2015  Component Date Value Ref Range Status  . Cholesterol 04/04/2015 217* 0 - 200 mg/dL Final   ATP III Classification       Desirable:  < 200 mg/dL               Borderline High:  200 - 239 mg/dL          High:  > = 240 mg/dL  . Triglycerides 04/04/2015 413.0 Triglyceride is over 400; calculations on Lipids are invalid.* 0.0 - 149.0 mg/dL Final   Normal:  <150 mg/dLBorderline High:  150 - 199 mg/dL  . HDL 04/04/2015 34.70* >39.00 mg/dL Final  . Total CHOL/HDL Ratio 04/04/2015 6   Final                  Men          Women1/2 Average Risk     3.4          3.3Average Risk          5.0          4.42X Average Risk          9.6          7.13X Average Risk          15.0          11.0                      . Glucose, Bld 04/04/2015 97  70 - 99 mg/dL Final  . Direct LDL 04/04/2015 106.0   Final   Optimal:  <100 mg/dLNear or Above Optimal:  100-129 mg/dLBorderline High:  130-159 mg/dLHigh:  160-189 mg/dLVery High:  >190 mg/dL  . Hgb A1c MFr Bld 04/04/2015 5.3  4.6 - 6.5 % Final   Glycemic Control Guidelines for People with Diabetes:Non  Diabetic:  <6%Goal of Therapy: <7%Additional Action Suggested:  >8%       EXAM:  BP 130/88 mmHg  Pulse 90  Temp(Src) 97.6 F (36.4 C)  Resp 16  Ht 5\' 3"  (1.6 m)   Wt 256 lb (116.121 kg)  BMI 45.36 kg/m2  SpO2 97%   Repeat with large cuff 120/74  Assessment/Plan:   OBESITY: Her BMI is over 45 and she has difficulty losing weight without medications.  Since she did tolerate the Qsymia with taking it in the evenings she needs to try to gain.  However will need to start with a 3.75 dose for 2 weeks and then go to 7.5 if tolerated.  Also given her information for [employer] in case she is not able to tolerate this.  Currently [employer] is not covered by insurance.   Encouraged her to exercise consistently and be consistent with diet, consider follow-up nutrition consultation  HYPERTENSION: This is well-controlled today Since her blood pressure is usually good at work will not change her medication as yet  HYPERLIPIDEMIA: She needs to be better compliant with taking her medication and also will need weight loss  Impaired fasting glucose: Glucose is 97 and A1c is normal   Kaleigha Chamberlin 04/09/2015, 5:05 PM

## 2015-07-09 ENCOUNTER — Other Ambulatory Visit (INDEPENDENT_AMBULATORY_CARE_PROVIDER_SITE_OTHER): Payer: BC Managed Care – PPO

## 2015-07-09 DIAGNOSIS — E063 Autoimmune thyroiditis: Secondary | ICD-10-CM

## 2015-07-09 DIAGNOSIS — R7301 Impaired fasting glucose: Secondary | ICD-10-CM | POA: Diagnosis not present

## 2015-07-09 DIAGNOSIS — E038 Other specified hypothyroidism: Secondary | ICD-10-CM

## 2015-07-09 DIAGNOSIS — E782 Mixed hyperlipidemia: Secondary | ICD-10-CM

## 2015-07-09 LAB — BASIC METABOLIC PANEL
BUN: 13 mg/dL (ref 6–23)
CALCIUM: 9.6 mg/dL (ref 8.4–10.5)
CHLORIDE: 100 meq/L (ref 96–112)
CO2: 26 mEq/L (ref 19–32)
CREATININE: 0.69 mg/dL (ref 0.40–1.20)
GFR: 98.47 mL/min (ref >60.00)
GLUCOSE: 104 mg/dL — AB (ref 70–99)
Potassium: 3.7 mEq/L (ref 3.5–5.1)
Sodium: 137 mEq/L (ref 135–145)

## 2015-07-09 LAB — LIPID PANEL
CHOL/HDL RATIO: 5
Cholesterol: 200 mg/dL (ref 0–200)
HDL: 36.8 mg/dL — ABNORMAL LOW (ref >39.00)
NONHDL: 163.05
Triglycerides: 309 mg/dL — ABNORMAL HIGH (ref 0.0–149.0)
VLDL: 61.8 mg/dL — ABNORMAL HIGH (ref 0.0–40.0)

## 2015-07-09 LAB — HEMOGLOBIN A1C: HEMOGLOBIN A1C: 5.2 % (ref 4.6–6.5)

## 2015-07-09 LAB — TSH: TSH: 3.1 u[IU]/mL (ref 0.35–4.50)

## 2015-07-09 LAB — LDL CHOLESTEROL, DIRECT: LDL DIRECT: 124 mg/dL

## 2015-07-12 ENCOUNTER — Ambulatory Visit (INDEPENDENT_AMBULATORY_CARE_PROVIDER_SITE_OTHER): Payer: BC Managed Care – PPO | Admitting: Endocrinology

## 2015-07-12 ENCOUNTER — Encounter: Payer: Self-pay | Admitting: Endocrinology

## 2015-07-12 ENCOUNTER — Ambulatory Visit: Payer: BC Managed Care – PPO | Admitting: Internal Medicine

## 2015-07-12 VITALS — BP 124/78 | HR 96 | Temp 97.7°F | Resp 16 | Ht 63.0 in | Wt 258.4 lb

## 2015-07-12 DIAGNOSIS — E782 Mixed hyperlipidemia: Secondary | ICD-10-CM

## 2015-07-12 DIAGNOSIS — I10 Essential (primary) hypertension: Secondary | ICD-10-CM | POA: Diagnosis not present

## 2015-07-12 DIAGNOSIS — E063 Autoimmune thyroiditis: Secondary | ICD-10-CM

## 2015-07-12 DIAGNOSIS — E038 Other specified hypothyroidism: Secondary | ICD-10-CM | POA: Diagnosis not present

## 2015-07-12 MED ORDER — LORCASERIN HCL 10 MG PO TABS: ORAL_TABLET | ORAL | Status: DC

## 2015-07-12 NOTE — Progress Notes (Signed)
Patient ID: Nicole Schwartz, female   DOB: [birth year], 44 y.o.   MRN: [id number]    Chief complaint: Followup of various problems  History of Present Illness:   OBESITY: She has had difficulty losing weight  She follows a low-fat diet off and on.  She is trying to walk on her treadmill but may not be regular with this However has not been able to lose any weight and weight is trending higher consistently  On her last visit she was tried on Qsymia at night but she still did not tolerate this and stopped this but did not let us know  Wt Readings from Last 3 Encounters:  07/12/15 258 lb 6.4 oz (117.209 kg)  04/09/15 256 lb (116.121 kg)  02/07/15 252 lb 12.8 oz (114.669 kg)    HYPERTENSION: She has had hypertension since at least 2004. Has been on ACE inhibitor since about 2005 Usually requiring relatively low doses of medications.  Because of swelling of her legs her ACE inhibitor was changed to low-dose Lotensin HCTZ and subsequently to Zestoretic. Initial blood pressure was higher today in the office  She is taking Voltaren 75 mg twice a day for her rheumatoid arthritis  IMPAIRED FASTING GLUCOSE: Her glucose was high at 109 previously   She has been  on metformin ER 750 mg   A1c has been quite normal Last glucose reading was just over 100   Lab Results  Component Value Date   HGBA1C 5.2 07/09/2015   HGBA1C 5.3 04/04/2015   HGBA1C 5.4 07/05/2014   Lab Results  Component Value Date   LDLCALC 102* 09/09/2013   CREATININE 0.69 07/09/2015    HYPOTHYROIDISM:  She has had a goiter since at least 1998. Baseline TSH was 5.4 when she was started on levothyroxine in 2000 and has required small doses of levothyroxine Has not required a change in her 75 g dose which has been consistent for quite some time     Lab Results  Component Value Date   TSH 3.10 07/09/2015   TSH 1.87 12/21/2014   TSH 0.94 07/05/2014   FREET4 0.81 03/07/2014   FREET4 0.84 09/09/2013    PCOS:  She had  oligomenorrhea and was treated with metformin since about 2002. She went off medications in ? 2012.   Metformin was started because of her impaired fasting glucose and still continues to have regular menstrual cycles.  Her last free testosterone level was normal   HYPERLIPIDEMIA: She has had diabetic dyslipidemia with HDL as low as 23 and high triglycerides. Previously on Crestor and fenofibrate combination, subsequently LDL level has been better without treatment  Triglycerides have been high despite starting fenofibrate  She has been trying to be more consistent and triglycerides are somewhat better She has gained weight and without consistent undyed   Lab Results  Component Value Date   CHOL 200 07/09/2015   HDL 36.80* 07/09/2015   LDLCALC 102* 09/09/2013   LDLDIRECT 124.0 07/09/2015   TRIG 309.0* 07/09/2015   CHOLHDL 5 07/09/2015       Medication List       This list is accurate as of: 07/12/15  4:38 PM.  Always use your most recent med list.               diclofenac 75 MG EC tablet  Commonly known as:  VOLTAREN  Take 75 mg by mouth 2 (two) times daily.     fenofibrate micronized 134 MG capsule  Commonly known as:  LOFIBRA  Take 1 capsule (134 mg total) by mouth daily before breakfast.     lisinopril-hydrochlorothiazide 10-12.5 MG per tablet  Commonly known as:  PRINZIDE,ZESTORETIC  Take 1 tablet by mouth daily.     metFORMIN 750 MG 24 hr tablet  Commonly known as:  GLUCOPHAGE-XR  TAKE 1 TABLET BY MOUTH DAILY WITH BREAKFAST     SYNTHROID 75 MCG tablet  Generic drug:  levothyroxine  TAKE 1 TABLET (75 MCG TOTAL) BY MOUTH DAILY BEFORE BREAKFAST.        Allergies: No Known Allergies  No past medical history on file.  No past surgical history on file.  Family History  Problem Relation Age of Onset  . Thyroid disease Mother   . Diabetes Maternal Grandfather   . Hypertension Maternal Grandfather     Social History:  reports that she has never smoked.  She has never used smokeless tobacco. Her alcohol and drug histories are not on file.  Review of Systems   She has been told to have rheumatoid arthritis and will be starting methotrexate, having difficulty getting follow-up with her usual rheumatologist  Currently taking Voltaren with no side effects   Labs:  Lab on 07/09/2015  Component Date Value Ref Range Status  . Hgb A1c MFr Bld 07/09/2015 5.2  4.6 - 6.5 % Final   Glycemic Control Guidelines for People with Diabetes:Non Diabetic:  <6%Goal of Therapy: <7%Additional Action Suggested:  >8%   . Sodium 07/09/2015 137  135 - 145 mEq/L Final  . Potassium 07/09/2015 3.7  3.5 - 5.1 mEq/L Final  . Chloride 07/09/2015 100  96 - 112 mEq/L Final  . CO2 07/09/2015 26  19 - 32 mEq/L Final  . Glucose, Bld 07/09/2015 104* 70 - 99 mg/dL Final  . BUN 07/09/2015 13  6 - 23 mg/dL Final  . Creatinine, Ser 07/09/2015 0.69  0.40 - 1.20 mg/dL Final  . Calcium 07/09/2015 9.6  8.4 - 10.5 mg/dL Final  . GFR 07/09/2015 98.47  >60.00 mL/min Final  . Cholesterol 07/09/2015 200  0 - 200 mg/dL Final   ATP III Classification       Desirable:  < 200 mg/dL               Borderline High:  200 - 239 mg/dL          High:  > = 240 mg/dL  . Triglycerides 07/09/2015 309.0* 0.0 - 149.0 mg/dL Final   Normal:  <150 mg/dLBorderline High:  150 - 199 mg/dL  . HDL 07/09/2015 36.80* >39.00 mg/dL Final  . VLDL 07/09/2015 61.8* 0.0 - 40.0 mg/dL Final  . Total CHOL/HDL Ratio 07/09/2015 5   Final                  Men          Women1/2 Average Risk     3.4          3.3Average Risk          5.0          4.42X Average Risk          9.6          7.13X Average Risk          15.0          11.0                      . NonHDL 07/09/2015 163.05   Final   NOTE:  Non-HDL  goal should be 30 mg/dL higher than patient's LDL goal (i.e. LDL goal of < 70 mg/dL, would have non-HDL goal of < 100 mg/dL)  . TSH 07/09/2015 3.10  0.35 - 4.50 uIU/mL Final  . Direct LDL 07/09/2015 124.0   Final    Optimal:  <100 mg/dLNear or Above Optimal:  100-129 mg/dLBorderline High:  130-159 mg/dLHigh:  160-189 mg/dLVery High:  >190 mg/dL      EXAM:  BP 138/94 mmHg  Pulse 96  Temp(Src) 97.7 F (36.5 C)  Resp 16  Ht 5\' 3"  (1.6 m)  Wt 258 lb 6.4 oz (117.209 kg)  BMI 45.78 kg/m2  SpO2 97%   Repeat with large cuff 124/78  Assessment/Plan:   OBESITY: Her BMI is over 45 and she has difficulty losing weight without medications.   Since she did tolerate the Qsymia she will need to try alternative medications Have given her information for Belviq and this may need prior authorization Encouraged her to exercise consistently and be consistent with diet, consider follow-up nutrition consultation  HYPERTENSION: This is well-controlled today, needs to have blood pressure checked with large cuff.  She will try to follow-up at work also periodically  [place]: triglycerides are slightly better but needs further improvement May also consider fish oil She needs to be losing weight but consider using a statin drug if LDL continues to be high  Impaired fasting glucose: Glucose is slightly higher fasting although A1c is still quite normal Needs weight loss and consistent exercise To continue metformin  Total visit time reviewing multiple problems, Labs, counseling regarding weight loss and medications = 25 minutes   Patient Instructions  Try belviq      Hawley Pavia 07/12/2015, 4:38 PM

## 2015-07-12 NOTE — Patient Instructions (Signed)
Try belviq

## 2015-07-15 ENCOUNTER — Other Ambulatory Visit: Payer: Self-pay | Admitting: Endocrinology

## 2015-10-12 ENCOUNTER — Ambulatory Visit: Payer: BC Managed Care – PPO | Admitting: Endocrinology

## 2015-11-07 ENCOUNTER — Other Ambulatory Visit: Payer: Self-pay | Admitting: Endocrinology

## 2015-11-07 ENCOUNTER — Encounter: Payer: Self-pay | Admitting: Endocrinology

## 2015-11-07 ENCOUNTER — Ambulatory Visit (INDEPENDENT_AMBULATORY_CARE_PROVIDER_SITE_OTHER): Payer: BC Managed Care – PPO | Admitting: Endocrinology

## 2015-11-07 DIAGNOSIS — R7301 Impaired fasting glucose: Secondary | ICD-10-CM

## 2015-11-07 DIAGNOSIS — E782 Mixed hyperlipidemia: Secondary | ICD-10-CM | POA: Diagnosis not present

## 2015-11-07 DIAGNOSIS — I10 Essential (primary) hypertension: Secondary | ICD-10-CM

## 2015-11-07 DIAGNOSIS — E038 Other specified hypothyroidism: Secondary | ICD-10-CM

## 2015-11-07 DIAGNOSIS — E063 Autoimmune thyroiditis: Secondary | ICD-10-CM

## 2015-11-07 NOTE — Progress Notes (Signed)
Patient ID: Nicole Schwartz, female   DOB: 1971-06-22, 44 y.o.   MRN: [id number]    Chief complaint: Followup of various problems  History of Present Illness:   OBESITY: She has had difficulty losing weight. She follows a low-fat diet off and on.   On her last visit because of inability to lose weight on her own she was started on Belviq. She was supposed to follow-up in 6 weeks but did not do so Initially had some headaches with this but she started taking one tablet a day and now is tolerating twice a day She is able to afford the $50 co-pay The medication does control her appetite and she has reduced her portions, not clear if she is making good choices all the time Has lost 13 pounds  Lately has not been walking or doing any exercise since she is working long hours but she does have a one-hour lunch break   Wt Readings from Last 3 Encounters:  11/07/15 245 lb 3.2 oz (111.222 kg)  07/12/15 258 lb 6.4 oz (117.209 kg)  04/09/15 256 lb (116.121 kg)    HYPERTENSION: She has had hypertension since at least 2004. Has been on ACE inhibitor since about 2005 Usually requiring relatively low doses of medications.  Because of swelling of her legs her ACE inhibitor was changed to low-dose Lotensin HCTZ and subsequently to Zestoretic.   Home BP 118/75  She is taking Voltaren 75 mg twice a day for her rheumatoid arthritis  IMPAIRED FASTING GLUCOSE: Her glucose was high at 109 previously   She has been  on metformin ER 750 mg   A1c has been quite normal with Last glucose reading 102   Lab Results  Component Value Date   HGBA1C 5.2 07/09/2015   HGBA1C 5.3 04/04/2015   HGBA1C 5.4 07/05/2014   Lab Results  Component Value Date   LDLCALC 102* 09/09/2013   CREATININE 0.69 07/09/2015    HYPOTHYROIDISM:  She has had a goiter since at least 1998. Baseline TSH was 5.4 when she was started on levothyroxine in 2000 and has required small doses of levothyroxine Has not required  a change in her 75 g dose which has been consistent for quite some time     Lab Results  Component Value Date   TSH 3.10 07/09/2015   TSH 1.87 12/21/2014   TSH 0.94 07/05/2014   FREET4 0.81 03/07/2014   FREET4 0.84 09/09/2013      HYPERLIPIDEMIA: She has had diabetic dyslipidemia with HDL as low as 23 and high triglycerides. Previously on Crestor and fenofibrate combination, subsequently LDL level has been better without treatment  Triglycerides have been high despite starting fenofibrate  No recent labs available  Lab Results  Component Value Date   CHOL 200 07/09/2015   HDL 36.80* 07/09/2015   LDLCALC 102* 09/09/2013   LDLDIRECT 124.0 07/09/2015   TRIG 309.0* 07/09/2015   CHOLHDL 5 07/09/2015       Medication List       This list is accurate as of: 11/07/15  1:24 PM.  Always use your most recent med list.               diclofenac 75 MG EC tablet  Commonly known as:  VOLTAREN  Take 75 mg by mouth 2 (two) times daily.     fenofibrate micronized 134 MG capsule  Commonly known as:  LOFIBRA  TAKE ONE CAPSULE BY MOUTH EVERY DAY BEFORE BREAKFAST  lisinopril-hydrochlorothiazide 10-12.5 MG tablet  Commonly known as:  PRINZIDE,ZESTORETIC  Take 1 tablet by mouth daily.     Lorcaserin HCl 10 MG Tabs  Commonly known as:  BELVIQ  1 tab bid     metFORMIN 750 MG 24 hr tablet  Commonly known as:  GLUCOPHAGE-XR  TAKE 1 TABLET BY MOUTH DAILY WITH BREAKFAST     SYNTHROID 75 MCG tablet  Generic drug:  levothyroxine  TAKE 1 TABLET BY MOUTH DAIYL BEFORE BREAKFAST        Allergies: No Known Allergies  No past medical history on file.  No past surgical history on file.  Family History  Problem Relation Age of Onset  . Thyroid disease Mother   . Diabetes Maternal Grandfather   . Hypertension Maternal Grandfather     Social History:  reports that she has never smoked. She has never used smokeless tobacco. Her alcohol and drug histories are not on  file.  Review of Systems   She has been told to have rheumatoid arthritis   Currently taking Voltaren with no side effects   Labs:  No visits with results within 1 Week(s) from this visit. Latest known visit with results is:  Lab on 07/09/2015  Component Date Value Ref Range Status  . Hgb A1c MFr Bld 07/09/2015 5.2  4.6 - 6.5 % Final   Glycemic Control Guidelines for People with Diabetes:Non Diabetic:  <6%Goal of Therapy: <7%Additional Action Suggested:  >8%   . Sodium 07/09/2015 137  135 - 145 mEq/L Final  . Potassium 07/09/2015 3.7  3.5 - 5.1 mEq/L Final  . Chloride 07/09/2015 100  96 - 112 mEq/L Final  . CO2 07/09/2015 26  19 - 32 mEq/L Final  . Glucose, Bld 07/09/2015 104* 70 - 99 mg/dL Final  . BUN 07/09/2015 13  6 - 23 mg/dL Final  . Creatinine, Ser 07/09/2015 0.69  0.40 - 1.20 mg/dL Final  . Calcium 07/09/2015 9.6  8.4 - 10.5 mg/dL Final  . GFR 07/09/2015 98.47  >60.00 mL/min Final  . Cholesterol 07/09/2015 200  0 - 200 mg/dL Final   ATP III Classification       Desirable:  < 200 mg/dL               Borderline High:  200 - 239 mg/dL          High:  > = 240 mg/dL  . Triglycerides 07/09/2015 309.0* 0.0 - 149.0 mg/dL Final   Normal:  <150 mg/dLBorderline High:  150 - 199 mg/dL  . HDL 07/09/2015 36.80* >39.00 mg/dL Final  . VLDL 07/09/2015 61.8* 0.0 - 40.0 mg/dL Final  . Total CHOL/HDL Ratio 07/09/2015 5   Final                  Men          Women1/2 Average Risk     3.4          3.3Average Risk          5.0          4.42X Average Risk          9.6          7.13X Average Risk          15.0          11.0                      . NonHDL 07/09/2015 163.05   Final  NOTE:  Non-HDL goal should be 30 mg/dL higher than patient's LDL goal (i.e. LDL goal of < 70 mg/dL, would have non-HDL goal of < 100 mg/dL)  . TSH 07/09/2015 3.10  0.35 - 4.50 uIU/mL Final  . Direct LDL 07/09/2015 124.0   Final   Optimal:  <100 mg/dLNear or Above Optimal:  100-129 mg/dLBorderline High:  130-159  mg/dLHigh:  160-189 mg/dLVery High:  >190 mg/dL      EXAM:  BP 132/78 mmHg  Pulse 111  Temp(Src) 98.1 F (36.7 C)  Resp 14  Ht 5\' 3"  (1.6 m)  Wt 245 lb 3.2 oz (111.222 kg)  BMI 43.45 kg/m2  SpO2 95%    Assessment/Plan:   OBESITY: She is now able to start losing weight with adding the week  Her BMI baseline was 45 However she is not exercising and discussed needing to walk during her lunch hour Also discussed balanced meals with enough fiber, less carbohydrate and low-fat protein sources  HYPERTENSION: This is well-controlled today, needs to have blood pressure checked with large cuff.  HYPERLIPIDEMIA: Will order labs for next visit   Patient Instructions  Start walking at lunch        [facility] 11/07/2015, 1:24 PM

## 2015-11-07 NOTE — Patient Instructions (Signed)
Start walking at lunch

## 2015-11-29 ENCOUNTER — Other Ambulatory Visit: Payer: Self-pay | Admitting: Endocrinology

## 2015-12-11 ENCOUNTER — Other Ambulatory Visit: Payer: Self-pay | Admitting: Endocrinology

## 2016-01-14 ENCOUNTER — Other Ambulatory Visit: Payer: Self-pay | Admitting: Endocrinology

## 2016-01-31 ENCOUNTER — Other Ambulatory Visit (INDEPENDENT_AMBULATORY_CARE_PROVIDER_SITE_OTHER): Payer: BLUE CROSS/BLUE SHIELD

## 2016-01-31 DIAGNOSIS — E063 Autoimmune thyroiditis: Secondary | ICD-10-CM

## 2016-01-31 DIAGNOSIS — E038 Other specified hypothyroidism: Secondary | ICD-10-CM

## 2016-01-31 DIAGNOSIS — E782 Mixed hyperlipidemia: Secondary | ICD-10-CM | POA: Diagnosis not present

## 2016-01-31 DIAGNOSIS — R7301 Impaired fasting glucose: Secondary | ICD-10-CM

## 2016-01-31 DIAGNOSIS — I10 Essential (primary) hypertension: Secondary | ICD-10-CM | POA: Diagnosis not present

## 2016-01-31 LAB — COMPREHENSIVE METABOLIC PANEL
ALBUMIN: 4.2 g/dL (ref 3.5–5.2)
ALT: 26 U/L (ref 0–35)
AST: 18 U/L (ref 0–37)
Alkaline Phosphatase: 63 U/L (ref 39–117)
BUN: 10 mg/dL (ref 6–23)
CALCIUM: 9.7 mg/dL (ref 8.4–10.5)
CO2: 29 meq/L (ref 19–32)
CREATININE: 0.65 mg/dL (ref 0.40–1.20)
Chloride: 102 mEq/L (ref 96–112)
GFR: 105.22 mL/min (ref >60.00)
GLUCOSE: 104 mg/dL — AB (ref 70–99)
POTASSIUM: 3.8 meq/L (ref 3.5–5.1)
Sodium: 138 mEq/L (ref 135–145)
TOTAL PROTEIN: 7.2 g/dL (ref 6.0–8.3)
Total Bilirubin: 0.5 mg/dL (ref 0.2–1.2)

## 2016-01-31 LAB — LIPID PANEL
CHOL/HDL RATIO: 6
CHOLESTEROL: 183 mg/dL (ref 0–200)
HDL: 30.1 mg/dL — ABNORMAL LOW (ref >39.00)
NonHDL: 152.79
TRIGLYCERIDES: 280 mg/dL — AB (ref 0.0–149.0)
VLDL: 56 mg/dL — AB (ref 0.0–40.0)

## 2016-01-31 LAB — T4, FREE: Free T4: 1.07 ng/dL (ref 0.60–1.60)

## 2016-01-31 LAB — LDL CHOLESTEROL, DIRECT: Direct LDL: 108 mg/dL

## 2016-01-31 LAB — HEMOGLOBIN A1C: Hgb A1c MFr Bld: 5.3 % (ref 4.6–6.5)

## 2016-01-31 LAB — TSH: TSH: 1.45 u[IU]/mL (ref 0.35–4.50)

## 2016-02-05 ENCOUNTER — Ambulatory Visit: Payer: BC Managed Care – PPO | Admitting: Endocrinology

## 2016-02-06 ENCOUNTER — Ambulatory Visit (INDEPENDENT_AMBULATORY_CARE_PROVIDER_SITE_OTHER): Payer: BLUE CROSS/BLUE SHIELD | Admitting: Endocrinology

## 2016-02-06 ENCOUNTER — Encounter: Payer: Self-pay | Admitting: Endocrinology

## 2016-02-06 DIAGNOSIS — E782 Mixed hyperlipidemia: Secondary | ICD-10-CM | POA: Diagnosis not present

## 2016-02-06 DIAGNOSIS — E038 Other specified hypothyroidism: Secondary | ICD-10-CM

## 2016-02-06 DIAGNOSIS — R7301 Impaired fasting glucose: Secondary | ICD-10-CM | POA: Diagnosis not present

## 2016-02-06 DIAGNOSIS — E063 Autoimmune thyroiditis: Secondary | ICD-10-CM

## 2016-02-06 NOTE — Progress Notes (Signed)
Patient ID: Nicole Schwartz, female   DOB: 1971/02/17, 45 y.o.   MRN: [id number]    Chief complaint: Followup of various problems  History of Present Illness:   OBESITY: .   On her August 2016 visit because of inability to lose weight on her own she was started on Belviq. She is tolerating this, has been compliant with taking it twice a day and she has no difficulty with her co-pay The medication does control her appetite and she has reduced her portions  However even though she had initially lost 13 pounds her weight has leveled off now She thinks she is watching her portions well as well as eating more salads, eating oatmeal in the morning and does not get hungry in between meals or snacks  She is also trying to walk fairly regularly   Wt Readings from Last 3 Encounters:  02/06/16 244 lb 6.4 oz (110.859 kg)  11/07/15 245 lb 3.2 oz (111.222 kg)  07/12/15 258 lb 6.4 oz (117.209 kg)    HYPERTENSION: She has had hypertension since at least 2004. Has been on ACE inhibitor since about 2005 Usually requiring relatively low doses of medications.  Because of swelling of her legs her ACE inhibitor was changed to low-dose Lotensin HCTZ and subsequently to Zestoretic.   Home BP: Has been  normal  IMPAIRED FASTING GLUCOSE: Her glucose was high at 109 previously   She has been  on metformin ER 750 mg   A1c has been quite normal but her fasting glucose is still relatively high at 104   Lab Results  Component Value Date   HGBA1C 5.3 01/31/2016   HGBA1C 5.2 07/09/2015   HGBA1C 5.3 04/04/2015   Lab Results  Component Value Date   LDLCALC 102* 09/09/2013   CREATININE 0.65 01/31/2016    HYPOTHYROIDISM:  She has had a goiter since at least 1998. Baseline TSH was 5.4 when she was started on levothyroxine in 2000 and has required small doses of levothyroxine Has not required a change in her 75 g dose which has been consistent for quite some time     Lab Results  Component  Value Date   TSH 1.45 01/31/2016   TSH 3.10 07/09/2015   TSH 1.87 12/21/2014   FREET4 1.07 01/31/2016   FREET4 0.81 03/07/2014   FREET4 0.84 09/09/2013      HYPERLIPIDEMIA: She has had diabetic dyslipidemia with HDL as low as 23 and high triglycerides. Previously on Crestor and fenofibrate combination, subsequently LDL level has been better without treatment  Triglycerides have been high despite Taking fenofibrate  LDL is slightly better compared to the last visit  Lab Results  Component Value Date   CHOL 183 01/31/2016   HDL 30.10* 01/31/2016   LDLCALC 102* 09/09/2013   LDLDIRECT 108.0 01/31/2016   TRIG 280.0* 01/31/2016   CHOLHDL 6 01/31/2016       Medication List       This list is accurate as of: 02/06/16  9:32 PM.  Always use your most recent med list.               BELVIQ 10 MG Tabs  Generic drug:  Lorcaserin HCl  TAKE 1 TABLET TWICE A DAY     diclofenac 75 MG EC tablet  Commonly known as:  VOLTAREN  Take 75 mg by mouth 2 (two) times daily.     fenofibrate micronized 134 MG capsule  Commonly known as:  LOFIBRA  TAKE ONE CAPSULE BY  MOUTH EVERY DAY BEFORE BREAKFAST     lisinopril-hydrochlorothiazide 10-12.5 MG tablet  Commonly known as:  PRINZIDE,ZESTORETIC  TAKE 1 TABLET BY MOUTH DAILY.     metFORMIN 750 MG 24 hr tablet  Commonly known as:  GLUCOPHAGE-XR  TAKE 1 TABLET BY MOUTH DAILY WITH BREAKFAST     SYNTHROID 75 MCG tablet  Generic drug:  levothyroxine  TAKE 1 TABLET BY MOUTH DAILY BEFORE BREAKFAST        Allergies: No Known Allergies  No past medical history on file.  No past surgical history on file.  Family History  Problem Relation Age of Onset  . Thyroid disease Mother   . Diabetes Maternal Grandfather   . Hypertension Maternal Grandfather     Social History:  reports that she has never smoked. She has never used smokeless tobacco. Her alcohol and drug histories are not on file.  Review of Systems   She has been told to have  rheumatoid arthritis   History of PCOS: Her menstrual cycles are regular  Labs:  Lab on 01/31/2016  Component Date Value Ref Range Status  . Hgb A1c MFr Bld 01/31/2016 5.3  4.6 - 6.5 % Final   Glycemic Control Guidelines for People with Diabetes:Non Diabetic:  <6%Goal of Therapy: <7%Additional Action Suggested:  >8%   . Sodium 01/31/2016 138  135 - 145 mEq/L Final  . Potassium 01/31/2016 3.8  3.5 - 5.1 mEq/L Final  . Chloride 01/31/2016 102  96 - 112 mEq/L Final  . CO2 01/31/2016 29  19 - 32 mEq/L Final  . Glucose, Bld 01/31/2016 104* 70 - 99 mg/dL Final  . BUN 01/31/2016 10  6 - 23 mg/dL Final  . Creatinine, Ser 01/31/2016 0.65  0.40 - 1.20 mg/dL Final  . Total Bilirubin 01/31/2016 0.5  0.2 - 1.2 mg/dL Final  . Alkaline Phosphatase 01/31/2016 63  39 - 117 U/L Final  . AST 01/31/2016 18  0 - 37 U/L Final  . ALT 01/31/2016 26  0 - 35 U/L Final  . Total Protein 01/31/2016 7.2  6.0 - 8.3 g/dL Final  . Albumin 01/31/2016 4.2  3.5 - 5.2 g/dL Final  . Calcium 01/31/2016 9.7  8.4 - 10.5 mg/dL Final  . GFR 01/31/2016 105.22  >60.00 mL/min Final  . TSH 01/31/2016 1.45  0.35 - 4.50 uIU/mL Final  . Free T4 01/31/2016 1.07  0.60 - 1.60 ng/dL Final  . Cholesterol 01/31/2016 183  0 - 200 mg/dL Final   ATP III Classification       Desirable:  < 200 mg/dL               Borderline High:  200 - 239 mg/dL          High:  > = 240 mg/dL  . Triglycerides 01/31/2016 280.0* 0.0 - 149.0 mg/dL Final   Normal:  <150 mg/dLBorderline High:  150 - 199 mg/dL  . HDL 01/31/2016 30.10* >39.00 mg/dL Final  . VLDL 01/31/2016 56.0* 0.0 - 40.0 mg/dL Final  . Total CHOL/HDL Ratio 01/31/2016 6   Final                  Men          Women1/2 Average Risk     3.4          3.3Average Risk          5.0          4.42X Average Risk  9.6          7.13X Average Risk          15.0          11.0                      . NonHDL 01/31/2016 152.79   Final   NOTE:  Non-HDL goal should be 30 mg/dL higher than patient's LDL goal  (i.e. LDL goal of < 70 mg/dL, would have non-HDL goal of < 100 mg/dL)  . Direct LDL 01/31/2016 108.0   Final   Optimal:  <100 mg/dLNear or Above Optimal:  100-129 mg/dLBorderline High:  130-159 mg/dLHigh:  160-189 mg/dLVery High:  >190 mg/dL      EXAM:  BP 124/68 mmHg  Pulse 91  Temp(Src) 98.3 F (36.8 C)  Resp 14  Ht 5\' 3"  (1.6 m)  Wt 244 lb 6.4 oz (110.859 kg)  BMI 43.30 kg/m2  SpO2 95%     Assessment/Plan:   OBESITY: She is recently not losing weight even though she is continuing her Belviq  This is despite her being very good with her diet and portions as well as calories as well as trying to walk for exercise regularly Previously had not tolerated Qsymia and not clear if she will tolerate other weight loss drugs However she thinks she can continue to increase her exercise and will wait another 4 months before making a change   HYPERTENSION: This is well-controlled , needs to have blood pressure checked with large cuff.  HYPERLIPIDEMIA: Still has mild increase in triglycerides, hopefully will improve with further weight loss  Impaired fasting glucose: Stable   There are no Patient Instructions on file for this visit.    Michell Kader 02/06/2016, 9:32 PM

## 2016-05-19 ENCOUNTER — Other Ambulatory Visit: Payer: Self-pay | Admitting: Endocrinology

## 2016-05-21 ENCOUNTER — Other Ambulatory Visit: Payer: Self-pay | Admitting: Endocrinology

## 2016-05-23 NOTE — Telephone Encounter (Signed)
Pt requesting Belviq RF Last OV 02/06/16 Next OV 06/09/16 Last RX filled 6/19 # 60 x no refill. Okay to fill?

## 2016-06-04 ENCOUNTER — Other Ambulatory Visit (INDEPENDENT_AMBULATORY_CARE_PROVIDER_SITE_OTHER): Payer: BLUE CROSS/BLUE SHIELD

## 2016-06-04 DIAGNOSIS — R7301 Impaired fasting glucose: Secondary | ICD-10-CM | POA: Diagnosis not present

## 2016-06-04 DIAGNOSIS — E782 Mixed hyperlipidemia: Secondary | ICD-10-CM

## 2016-06-04 LAB — BASIC METABOLIC PANEL
BUN: 15 mg/dL (ref 6–23)
CHLORIDE: 100 meq/L (ref 96–112)
CO2: 24 meq/L (ref 19–32)
CREATININE: 0.82 mg/dL (ref 0.40–1.20)
Calcium: 9.8 mg/dL (ref 8.4–10.5)
GFR: 80.35 mL/min (ref >60.00)
GLUCOSE: 113 mg/dL — AB (ref 70–99)
Potassium: 3.6 mEq/L (ref 3.5–5.1)
Sodium: 136 mEq/L (ref 135–145)

## 2016-06-04 LAB — LIPID PANEL
CHOL/HDL RATIO: 6
Cholesterol: 176 mg/dL (ref 0–200)
HDL: 29.8 mg/dL — ABNORMAL LOW (ref >39.00)
NONHDL: 146
TRIGLYCERIDES: 261 mg/dL — AB (ref 0.0–149.0)
VLDL: 52.2 mg/dL — ABNORMAL HIGH (ref 0.0–40.0)

## 2016-06-04 LAB — LDL CHOLESTEROL, DIRECT: Direct LDL: 117 mg/dL

## 2016-06-09 ENCOUNTER — Ambulatory Visit (INDEPENDENT_AMBULATORY_CARE_PROVIDER_SITE_OTHER): Payer: BLUE CROSS/BLUE SHIELD | Admitting: Endocrinology

## 2016-06-09 ENCOUNTER — Encounter: Payer: Self-pay | Admitting: Endocrinology

## 2016-06-09 VITALS — BP 130/80 | HR 75 | Ht 63.0 in | Wt 235.0 lb

## 2016-06-09 DIAGNOSIS — R7301 Impaired fasting glucose: Secondary | ICD-10-CM

## 2016-06-09 DIAGNOSIS — E782 Mixed hyperlipidemia: Secondary | ICD-10-CM

## 2016-06-09 MED ORDER — METFORMIN HCL ER 750 MG PO TB24: 2250.0000 mg | ORAL_TABLET | Freq: Every day | ORAL | Status: DC

## 2016-06-09 MED ORDER — ROSUVASTATIN CALCIUM 5 MG PO TABS: 5.0000 mg | ORAL_TABLET | Freq: Every day | ORAL | Status: DC

## 2016-06-09 NOTE — Progress Notes (Signed)
Patient ID: Nicole Schwartz, female   DOB: October 07, 1971, 45 y.o.   MRN: [id number]    Chief complaint: Followup of various problems  History of Present Illness:   OBESITY: .   On her August 2016 visit because of inability to lose weight on her own she was started on Belviq. She is tolerating this, has been compliant with taking it twice a day and she has no difficulty with her co-pay The medication does control her appetite and she has reduced her portions  She had initially lost 13 pounds but subsequently her weight has leveled off Now she has been able to lose another 9 pounds She thinks this is from trying to increase activity level and walking especially during her lunch hour She thinks she is watching her portions well as well as eating  salads, eating oatmeal in the morning and does not get hungry in between meals or snacks   Wt Readings from Last 3 Encounters:  06/09/16 235 lb (106.595 kg)  02/06/16 244 lb 6.4 oz (110.859 kg)  11/07/15 245 lb 3.2 oz (111.222 kg)    HYPERTENSION: She has had hypertension since at least 2004. Has been on ACE inhibitor since about 2005 Usually requiring relatively low doses of medications.  Because of swelling of her legs her ACE inhibitor was changed to low-dose Lotensin HCTZ and subsequently to Zestoretic10/12.5.   Home BP: Checked regularly and not high  IMPAIRED FASTING GLUCOSE: Her glucose was high at 109 previously   She has been  on metformin ER 750 mg twice a day   A1c has been quite normal but her fasting glucose is higher than usual at 113 This is despite her weight loss   Lab Results  Component Value Date   HGBA1C 5.3 01/31/2016   HGBA1C 5.2 07/09/2015   HGBA1C 5.3 04/04/2015   Lab Results  Component Value Date   LDLCALC 102* 09/09/2013   CREATININE 0.82 06/04/2016    HYPOTHYROIDISM:  She has had a goiter since at least 1998. Baseline TSH was 5.4 when she was started on levothyroxine in 2000 and has required  small doses of levothyroxine Has not required a change in her 75 g dose which has been consistent for quite some time     Lab Results  Component Value Date   TSH 1.45 01/31/2016   TSH 3.10 07/09/2015   TSH 1.87 12/21/2014   FREET4 1.07 01/31/2016   FREET4 0.81 03/07/2014   FREET4 0.84 09/09/2013      HYPERLIPIDEMIA: She has had diabetic dyslipidemia with HDL as low as 23 and high triglycerides. Previously on Crestor and fenofibrate combination, subsequently LDL level has been better without treatment  Triglycerides have been high Even with continuing fenofibrate and weight loss LDL is slightly higher compared to the last visit  Lab Results  Component Value Date   CHOL 176 06/04/2016   HDL 29.80* 06/04/2016   LDLCALC 102* 09/09/2013   LDLDIRECT 117.0 06/04/2016   TRIG 261.0* 06/04/2016   CHOLHDL 6 06/04/2016       Medication List       This list is accurate as of: 06/09/16  9:18 PM.  Always use your most recent med list.               BELVIQ 10 MG Tabs  Generic drug:  Lorcaserin HCl  TAKE 1 TABLET BY MOUTH TWICE A DAY     diclofenac 75 MG EC tablet  Commonly known as:  VOLTAREN  Take 75 mg by mouth 2 (two) times daily.     fenofibrate micronized 134 MG capsule  Commonly known as:  LOFIBRA  TAKE ONE CAPSULE BY MOUTH EVERY DAY BEFORE BREAKFAST     hydroxychloroquine 200 MG tablet  Commonly known as:  PLAQUENIL  TAKE 1 TABLET BY MOUTH TWICE A DAY WITH FOOD OR MILK     lisinopril-hydrochlorothiazide 10-12.5 MG tablet  Commonly known as:  PRINZIDE,ZESTORETIC  TAKE 1 TABLET BY MOUTH DAILY.     metFORMIN 750 MG 24 hr tablet  Commonly known as:  GLUCOPHAGE-XR  TAKE 1 TABLET BY MOUTH DAILY WITH BREAKFAST     SYNTHROID 75 MCG tablet  Generic drug:  levothyroxine  TAKE 1 TABLET BY MOUTH DAILY BEFORE BREAKFAST        Allergies: No Known Allergies  No past medical history on file.  No past surgical history on file.  Family History  Problem Relation Age  of Onset  . Thyroid disease Mother   . Diabetes Maternal Grandfather   . Hypertension Maternal Grandfather     Social History:  reports that she has never smoked. She has never used smokeless tobacco. Her alcohol and drug histories are not on file.  Review of Systems   She has been told to have rheumatoid arthritis, On Plaquenil   History of PCOS: Her menstrual cycles are regular  Labs:  Lab on 06/04/2016  Component Date Value Ref Range Status  . Sodium 06/04/2016 136  135 - 145 mEq/L Final  . Potassium 06/04/2016 3.6  3.5 - 5.1 mEq/L Final  . Chloride 06/04/2016 100  96 - 112 mEq/L Final  . CO2 06/04/2016 24  19 - 32 mEq/L Final  . Glucose, Bld 06/04/2016 113* 70 - 99 mg/dL Final  . BUN 06/04/2016 15  6 - 23 mg/dL Final  . Creatinine, Ser 06/04/2016 0.82  0.40 - 1.20 mg/dL Final  . Calcium 06/04/2016 9.8  8.4 - 10.5 mg/dL Final  . GFR 06/04/2016 80.35  >60.00 mL/min Final  . Cholesterol 06/04/2016 176  0 - 200 mg/dL Final   ATP III Classification       Desirable:  < 200 mg/dL               Borderline High:  200 - 239 mg/dL          High:  > = 240 mg/dL  . Triglycerides 06/04/2016 261.0* 0.0 - 149.0 mg/dL Final   Normal:  <150 mg/dLBorderline High:  150 - 199 mg/dL  . HDL 06/04/2016 29.80* >39.00 mg/dL Final  . VLDL 06/04/2016 52.2* 0.0 - 40.0 mg/dL Final  . Total CHOL/HDL Ratio 06/04/2016 6   Final                  Men          Women1/2 Average Risk     3.4          3.3Average Risk          5.0          4.42X Average Risk          9.6          7.13X Average Risk          15.0          11.0                      . NonHDL 06/04/2016 146.00   Final   NOTE:  Non-HDL goal should be 30 mg/dL higher than patient's LDL goal (i.e. LDL goal of < 70 mg/dL, would have non-HDL goal of < 100 mg/dL)  . Direct LDL 06/04/2016 117.0   Final   Optimal:  <100 mg/dLNear or Above Optimal:  100-129 mg/dLBorderline High:  130-159 mg/dLHigh:  160-189 mg/dLVery High:  >190 mg/dL      EXAM:  BP  130/80 mmHg  Pulse 75  Ht 5\' 3"  (1.6 m)  Wt 235 lb (106.595 kg)  BMI 41.64 kg/m2  SpO2 96%     Assessment/Plan:   OBESITY: She is recently losing weight And is benefiting from increasing exercise along with continuing her Belviq  and diet  Hypothyroidism: Stable  HYPERTENSION: This is well-controlled , needs to have blood pressure checked with large cuff.  HYPERLIPIDEMIA: Still has mild increase in triglycerides Since her fasting triglycerides are not controlled with fenofibrate alone and her LDL is higher she will go back to taking Crestor  Impaired fasting glucose: Stable A1c but her glucose is 113.  Will increase her metformin from 1500 up to 2250 mg   Patient Instructions  Metformin 3 daily      [employer] 06/09/2016, 9:18 PM

## 2016-06-09 NOTE — Patient Instructions (Signed)
Metformin 3 daily

## 2016-06-12 ENCOUNTER — Telehealth: Payer: Self-pay | Admitting: Endocrinology

## 2016-06-12 MED ORDER — SYNTHROID 75 MCG PO TABS: ORAL_TABLET | ORAL | Status: DC

## 2016-06-12 NOTE — Telephone Encounter (Signed)
Rx submitted per pt's request.  

## 2016-06-12 NOTE — Telephone Encounter (Signed)
Pt needs [facility] called [facility]

## 2016-06-24 ENCOUNTER — Other Ambulatory Visit: Payer: Self-pay | Admitting: Endocrinology

## 2016-06-25 NOTE — Telephone Encounter (Signed)
Pt requesting a refill, last filed on 6/29 last ov 7/10. Ok to refill?

## 2016-06-27 NOTE — Telephone Encounter (Signed)
Pt needs belviq called in to [facility] on [address]

## 2016-07-04 ENCOUNTER — Other Ambulatory Visit: Payer: Self-pay | Admitting: Endocrinology

## 2016-07-07 ENCOUNTER — Other Ambulatory Visit: Payer: Self-pay

## 2016-07-07 MED ORDER — LORCASERIN HCL 10 MG PO TABS: 1.0000 | ORAL_TABLET | Freq: Two times a day (BID) | ORAL | 0 refills | Status: DC

## 2016-07-07 NOTE — Telephone Encounter (Signed)
Pt is in need of the belviq please called to [facility] on [address]

## 2016-08-05 ENCOUNTER — Other Ambulatory Visit: Payer: Self-pay | Admitting: *Deleted

## 2016-08-05 MED ORDER — LORCASERIN HCL 10 MG PO TABS: 1.0000 | ORAL_TABLET | Freq: Two times a day (BID) | ORAL | 5 refills | Status: DC

## 2016-10-06 ENCOUNTER — Other Ambulatory Visit (INDEPENDENT_AMBULATORY_CARE_PROVIDER_SITE_OTHER): Payer: BLUE CROSS/BLUE SHIELD

## 2016-10-06 DIAGNOSIS — R7301 Impaired fasting glucose: Secondary | ICD-10-CM

## 2016-10-06 DIAGNOSIS — E782 Mixed hyperlipidemia: Secondary | ICD-10-CM

## 2016-10-06 LAB — LIPID PANEL
CHOL/HDL RATIO: 5
Cholesterol: 165 mg/dL (ref 0–200)
HDL: 36.1 mg/dL — AB (ref >39.00)
LDL Cholesterol: 92 mg/dL (ref 0–99)
NONHDL: 129.32
Triglycerides: 187 mg/dL — ABNORMAL HIGH (ref 0.0–149.0)
VLDL: 37.4 mg/dL (ref 0.0–40.0)

## 2016-10-06 LAB — BASIC METABOLIC PANEL
BUN: 15 mg/dL (ref 6–23)
CALCIUM: 9.9 mg/dL (ref 8.4–10.5)
CO2: 30 meq/L (ref 19–32)
Chloride: 100 mEq/L (ref 96–112)
Creatinine, Ser: 0.71 mg/dL (ref 0.40–1.20)
GFR: 94.73 mL/min (ref >60.00)
GLUCOSE: 100 mg/dL — AB (ref 70–99)
POTASSIUM: 4.2 meq/L (ref 3.5–5.1)
SODIUM: 137 meq/L (ref 135–145)

## 2016-10-06 LAB — HEMOGLOBIN A1C: Hgb A1c MFr Bld: 5.2 % (ref 4.6–6.5)

## 2016-10-09 ENCOUNTER — Ambulatory Visit (INDEPENDENT_AMBULATORY_CARE_PROVIDER_SITE_OTHER): Payer: BLUE CROSS/BLUE SHIELD | Admitting: Endocrinology

## 2016-10-09 ENCOUNTER — Encounter: Payer: Self-pay | Admitting: Endocrinology

## 2016-10-09 VITALS — BP 112/78 | HR 85 | Wt 240.0 lb

## 2016-10-09 DIAGNOSIS — E038 Other specified hypothyroidism: Secondary | ICD-10-CM | POA: Diagnosis not present

## 2016-10-09 DIAGNOSIS — R7301 Impaired fasting glucose: Secondary | ICD-10-CM | POA: Diagnosis not present

## 2016-10-09 DIAGNOSIS — E782 Mixed hyperlipidemia: Secondary | ICD-10-CM

## 2016-10-09 DIAGNOSIS — E063 Autoimmune thyroiditis: Secondary | ICD-10-CM

## 2016-10-09 MED ORDER — ROSUVASTATIN CALCIUM 5 MG PO TABS: 5.0000 mg | ORAL_TABLET | Freq: Every day | ORAL | 3 refills | Status: DC

## 2016-10-09 NOTE — Patient Instructions (Signed)
INDOOR EXERCISE IDEAS   Use the following examples for a creative indoor workout (perform each move for 2-3 minutes):   Warm up. Put on some music that makes you feel like moving, and dance around the living room.  Watch exercise shows on TV and move along with them. There are tons of free cable channels that have daily exercise shows on them for all levels - beginner through advanced.   You can easily find a number of exercise videos but use one that will suit your liking and exercise level; you can do these on your own schedule.  Walk up and down the steps.  Do dumbbell curls and presses (if you don't have weights, use full water bottles).  Do assisted squats, keeping your back on a fitness ball against the wall or using the back of the couch for support.  Shadow box: Lift and lower the left leg; jab with the right arm, then the left; then lift and lower the right leg.  Fence (you don't even need swords). Pretend you're holding a sword in each hand. Create an X pattern standing still, then moving forward and back.  Hop on your exercise bike or treadmill -- or, for something different, use a weighted hula hoop. If you don't have any of those, just go back to dancing.  Do abdominal crunches (hold a weighted ball for added resistance).  Cool down with James Brown's "I Feel Good" -- or whatever tune makes you feel good  

## 2016-10-09 NOTE — Progress Notes (Signed)
Patient ID: Nicole Schwartz, female   DOB: September 10, 1971, 45 y.o.   MRN: [id number]    Chief complaint: Followup of various problems  History of Present Illness:  OBESITY: .   On her August 2016 visit because of inability to lose weight on her own she was started on Belviq 10 mg twice a day. She is tolerating this, has been compliant with taking it twice a day  The medication does control her appetite and she has reduced her portions  She had initially lost 13 pounds but subsequently her weight has has been inconsistent and recently has gained back 5 pounds She thinks this is from not being able to exercise consistently, she was previously walking regularly including in her lunch hour She thinks she is watching her portions well as well as does not get hungry in between meals or snacks   Wt Readings from Last 3 Encounters:  10/09/16 240 lb (108.9 kg)  06/09/16 235 lb (106.6 kg)  02/06/16 244 lb 6.4 oz (110.9 kg)    HYPERTENSION: She has had hypertension since at least 2004. Has been on ACE inhibitor since about 2005 Usually requiring relatively low doses of medications.  Because of swelling of her legs her ACE inhibitor was changed to low-dose Lotensin HCTZ and subsequently to Zestoretic10/12.5. Blood pressure well-controlled   IMPAIRED FASTING GLUCOSE: Her glucose was high at 109 previously   She has been  on metformin ER 750 mg, now 3 tablets daily since her last visit   A1c has been quite normal below the pre-diabetic range and glucose now is 100 fasting This is despite her weight gain   Lab Results  Component Value Date   HGBA1C 5.2 10/06/2016   HGBA1C 5.3 01/31/2016   HGBA1C 5.2 07/09/2015   Lab Results  Component Value Date   LDLCALC 92 10/06/2016   CREATININE 0.71 10/06/2016    HYPOTHYROIDISM:  She has had a goiter since at least 1998. Baseline TSH was 5.4 when she was started on levothyroxine in 2000 and has required small doses of levothyroxine Has  not required a change in her 75 g dose which has been consistent for quite some time     Lab Results  Component Value Date   TSH 1.45 01/31/2016   TSH 3.10 07/09/2015   TSH 1.87 12/21/2014   FREET4 1.07 01/31/2016   FREET4 0.81 03/07/2014   FREET4 0.84 09/09/2013      HYPERLIPIDEMIA: She has had diabetic dyslipidemia with HDL as low as 23 and high triglycerides. Previously on Crestor and fenofibrate combination, subsequently Crestor was stopped because of improved LDL  Triglycerides have been high but now below 200 with her fenofibrate LDL is slightly higher compared to the last visit and is now consistently over 100   Lab Results  Component Value Date   CHOL 165 10/06/2016   HDL 36.10 (L) 10/06/2016   LDLCALC 92 10/06/2016   LDLDIRECT 117.0 06/04/2016   TRIG 187.0 (H) 10/06/2016   CHOLHDL 5 10/06/2016       Medication List       Accurate as of 10/09/16  4:44 PM. Always use your most recent med list.          diclofenac 75 MG EC tablet Commonly known as:  VOLTAREN Take 75 mg by mouth 2 (two) times daily.   fenofibrate micronized 134 MG capsule Commonly known as:  LOFIBRA TAKE ONE CAPSULE BY MOUTH EVERY DAY BEFORE BREAKFAST   hydroxychloroquine 200 MG tablet Commonly  known as:  PLAQUENIL TAKE 1 TABLET BY MOUTH TWICE A DAY WITH FOOD OR MILK   lisinopril-hydrochlorothiazide 10-12.5 MG tablet Commonly known as:  PRINZIDE,ZESTORETIC TAKE 1 TABLET BY MOUTH DAILY.   Lorcaserin HCl 10 MG Tabs Commonly known as:  BELVIQ Take 1 tablet by mouth 2 (two) times daily.   metFORMIN 750 MG 24 hr tablet Commonly known as:  GLUCOPHAGE-XR Take 3 tablets (2,250 mg total) by mouth daily with breakfast.   rosuvastatin 5 MG tablet Commonly known as:  CRESTOR Take 1 tablet (5 mg total) by mouth daily.   SYNTHROID 75 MCG tablet Generic drug:  levothyroxine TAKE 1 TABLET BY MOUTH DAILY BEFORE BREAKFAST       Allergies: No Known Allergies  No past medical history on  file.  No past surgical history on file.  Family History  Problem Relation Age of Onset  . Thyroid disease Mother   . Diabetes Maternal Grandfather   . Hypertension Maternal Grandfather     Social History:  reports that she has never smoked. She has never used smokeless tobacco. Her alcohol and drug histories are not on file.  Review of Systems   She has been told to have rheumatoid arthritis, On Plaquenil   History of PCOS: Her menstrual cycles are regular  Labs:  Lab on 10/06/2016  Component Date Value Ref Range Status  . Sodium 10/06/2016 137  135 - 145 mEq/L Final  . Potassium 10/06/2016 4.2  3.5 - 5.1 mEq/L Final  . Chloride 10/06/2016 100  96 - 112 mEq/L Final  . CO2 10/06/2016 30  19 - 32 mEq/L Final  . Glucose, Bld 10/06/2016 100* 70 - 99 mg/dL Final  . BUN 10/06/2016 15  6 - 23 mg/dL Final  . Creatinine, Ser 10/06/2016 0.71  0.40 - 1.20 mg/dL Final  . Calcium 10/06/2016 9.9  8.4 - 10.5 mg/dL Final  . GFR 10/06/2016 94.73  >60.00 mL/min Final  . Hgb A1c MFr Bld 10/06/2016 5.2  4.6 - 6.5 % Final  . Cholesterol 10/06/2016 165  0 - 200 mg/dL Final  . Triglycerides 10/06/2016 187.0* 0.0 - 149.0 mg/dL Final  . HDL 10/06/2016 36.10* >39.00 mg/dL Final  . VLDL 10/06/2016 37.4  0.0 - 40.0 mg/dL Final  . LDL Cholesterol 10/06/2016 92  0 - 99 mg/dL Final  . Total CHOL/HDL Ratio 10/06/2016 5   Final  . NonHDL 10/06/2016 129.32   Final      EXAM:  BP 112/78   Pulse 85   Wt 240 lb (108.9 kg)   SpO2 98%   BMI 42.51 kg/m      Assessment/Plan:   OBESITY: She is Not losing weight and has gained back 5 pounds which is likely to be from as much exercise as she was doing Have given her ideas about how to increase exercise indoors Still is benefiting from  continuing her Belviq  and diet  Hypothyroidism: Needs follow-up on the next visit  HYPERTENSION: This is well-controlled  HYPERLIPIDEMIA: Still has mild increase in triglycerides but improving However LDL is  consistently over 100 and will start her back on Crestor  Impaired fasting glucose: Stable A1c in the normal range and glucose is 100 Doing better with increasing metformin to maximum dose now   There are no Patient Instructions on file for this visit.    Dee Paden 10/09/2016, 4:44 PM

## 2016-11-27 ENCOUNTER — Other Ambulatory Visit: Payer: Self-pay | Admitting: Endocrinology

## 2016-11-28 NOTE — Telephone Encounter (Signed)
Lisinopril-hctz and synthroid sent to pharmacy must keep appt for further refills

## 2017-01-26 ENCOUNTER — Other Ambulatory Visit: Payer: Self-pay

## 2017-01-26 MED ORDER — SYNTHROID 75 MCG PO TABS: ORAL_TABLET | ORAL | 2 refills | Status: DC

## 2017-02-03 ENCOUNTER — Other Ambulatory Visit: Payer: BC Managed Care – PPO

## 2017-02-06 ENCOUNTER — Ambulatory Visit: Payer: BC Managed Care – PPO | Admitting: Endocrinology

## 2017-02-26 ENCOUNTER — Other Ambulatory Visit: Payer: Self-pay | Admitting: Endocrinology

## 2017-03-16 ENCOUNTER — Telehealth: Payer: Self-pay | Admitting: Endocrinology

## 2017-03-16 NOTE — Telephone Encounter (Signed)
[place] authorization Dept is faxing over for for patient she ask you to review the cover sheet, it  fax back.

## 2017-03-17 ENCOUNTER — Telehealth: Payer: Self-pay | Admitting: Endocrinology

## 2017-03-17 NOTE — Telephone Encounter (Signed)
Spoke with Sam at [facility] and gave her the information she needed to process the PA for the patient

## 2017-03-17 NOTE — Telephone Encounter (Signed)
[employer] called regarding the PA for the Belvique. She said they need to know the Pt's BMI and if they have tried any other medications.  CB# [phone] Ref# [id number]

## 2017-04-10 ENCOUNTER — Other Ambulatory Visit: Payer: Self-pay | Admitting: Obstetrics and Gynecology

## 2017-04-10 DIAGNOSIS — R928 Other abnormal and inconclusive findings on diagnostic imaging of breast: Secondary | ICD-10-CM

## 2017-04-15 ENCOUNTER — Ambulatory Visit
Admission: RE | Admit: 2017-04-15 | Discharge: 2017-04-15 | Disposition: A | Discharge status: Home / Self Care | Payer: BC Managed Care – PPO | Source: Ambulatory Visit | Attending: Obstetrics and Gynecology | Admitting: Obstetrics and Gynecology

## 2017-04-15 DIAGNOSIS — R928 Other abnormal and inconclusive findings on diagnostic imaging of breast: Secondary | ICD-10-CM

## 2017-05-14 ENCOUNTER — Other Ambulatory Visit (INDEPENDENT_AMBULATORY_CARE_PROVIDER_SITE_OTHER): Payer: BLUE CROSS/BLUE SHIELD

## 2017-05-14 DIAGNOSIS — E063 Autoimmune thyroiditis: Secondary | ICD-10-CM

## 2017-05-14 DIAGNOSIS — E782 Mixed hyperlipidemia: Secondary | ICD-10-CM | POA: Diagnosis not present

## 2017-05-14 DIAGNOSIS — R7301 Impaired fasting glucose: Secondary | ICD-10-CM | POA: Diagnosis not present

## 2017-05-14 LAB — LIPID PANEL
Cholesterol: 167 mg/dL (ref 0–200)
HDL: 33.7 mg/dL — ABNORMAL LOW (ref >39.00)
LDL Cholesterol: 105 mg/dL — ABNORMAL HIGH (ref 0–99)
NonHDL: 133.76
Total CHOL/HDL Ratio: 5
Triglycerides: 144 mg/dL (ref 0.0–149.0)
VLDL: 28.8 mg/dL (ref 0.0–40.0)

## 2017-05-14 LAB — HEMOGLOBIN A1C: Hgb A1c MFr Bld: 5.2 % (ref 4.6–6.5)

## 2017-05-14 LAB — COMPREHENSIVE METABOLIC PANEL
ALT: 13 U/L (ref 0–35)
AST: 12 U/L (ref 0–37)
Albumin: 4.3 g/dL (ref 3.5–5.2)
Alkaline Phosphatase: 55 U/L (ref 39–117)
BUN: 15 mg/dL (ref 6–23)
CHLORIDE: 102 meq/L (ref 96–112)
CO2: 27 mEq/L (ref 19–32)
CREATININE: 0.69 mg/dL (ref 0.40–1.20)
Calcium: 9.8 mg/dL (ref 8.4–10.5)
GFR: 97.64 mL/min (ref >60.00)
Glucose, Bld: 100 mg/dL — ABNORMAL HIGH (ref 70–99)
Potassium: 4.1 mEq/L (ref 3.5–5.1)
Sodium: 136 mEq/L (ref 135–145)
Total Bilirubin: 0.4 mg/dL (ref 0.2–1.2)
Total Protein: 7.4 g/dL (ref 6.0–8.3)

## 2017-05-14 LAB — TSH: TSH: 1.54 u[IU]/mL (ref 0.35–4.50)

## 2017-05-24 ENCOUNTER — Other Ambulatory Visit: Payer: Self-pay | Admitting: Endocrinology

## 2017-06-01 ENCOUNTER — Encounter: Payer: Self-pay | Admitting: Endocrinology

## 2017-06-01 ENCOUNTER — Ambulatory Visit (INDEPENDENT_AMBULATORY_CARE_PROVIDER_SITE_OTHER): Payer: BLUE CROSS/BLUE SHIELD | Admitting: Endocrinology

## 2017-06-01 VITALS — BP 122/86 | HR 88 | Ht 63.0 in | Wt 244.2 lb

## 2017-06-01 DIAGNOSIS — I10 Essential (primary) hypertension: Secondary | ICD-10-CM | POA: Diagnosis not present

## 2017-06-01 DIAGNOSIS — E782 Mixed hyperlipidemia: Secondary | ICD-10-CM

## 2017-06-01 DIAGNOSIS — R7301 Impaired fasting glucose: Secondary | ICD-10-CM

## 2017-06-01 MED ORDER — PHENTERMINE HCL 15 MG PO CAPS: 15.0000 mg | ORAL_CAPSULE | ORAL | 3 refills | Status: DC

## 2017-06-01 NOTE — Progress Notes (Signed)
Patient ID: Nicole Schwartz, female   DOB: [birth year], 46 y.o.   MRN: [id number]    Chief complaint: Followup of various problems  History of Present Illness:  OBESITY:    On her August 2016 visit because of inability to lose weight on her own she was started on Belviq 10 mg twice a day. She is tolerating this, has been compliant with taking it twice a day  The medication in the past has controlled  her appetite and she has reduced her portions  She had previously lost over 20 pounds with baseline weight of 258 However subsequently her weight has has been progressively increasing over the last 3 visits Although she thinks she is trying to be fairly active and trying to eat healthy not clear why her weight is going up She is not doing any formal exercise at the gym  Wt Readings from Last 3 Encounters:  06/01/17 244 lb 3.2 oz (110.8 kg)  10/09/16 240 lb (108.9 kg)  06/09/16 235 lb (106.6 kg)    HYPERTENSION: She has had hypertension since at least 2004. Has been on ACE inhibitor since about 2005 Usually requiring relatively low doses of medications.  Because of swelling of her legs her ACE inhibitor was changed to low-dose Lotensin HCTZ and subsequently to Zestoretic10/12.5. Blood pressure well-controlled, she is able to monitor this occasionally at work    IMPAIRED FASTING GLUCOSE: Her glucose was high at 109 previously   She has been  on metformin ER 750 mg   A1c has been quite normal below the pre-diabetic range and glucose now is again 100 fasting This is despite her weight gain   Lab Results  Component Value Date   HGBA1C 5.2 05/14/2017   HGBA1C 5.2 10/06/2016   HGBA1C 5.3 01/31/2016   Lab Results  Component Value Date   LDLCALC 105 (H) 05/14/2017   CREATININE 0.69 05/14/2017    HYPOTHYROIDISM:  She has had a goiter since at least 1998. Baseline TSH was 5.4 when she was started on levothyroxine in 2000 and has required small doses of levothyroxine Has  not required a change in her 75 g dose which has been consistent for quite some time     Lab Results  Component Value Date   TSH 1.54 05/14/2017   TSH 1.45 01/31/2016   TSH 3.10 07/09/2015   FREET4 1.07 01/31/2016   FREET4 0.81 03/07/2014   FREET4 0.84 09/09/2013      HYPERLIPIDEMIA: She has had diabetic dyslipidemia with HDL as low as 23 and high triglycerides. Previously on Crestor and fenofibrate combination, subsequently Crestor was stopped because of improved LDL  Since LDL was relatively higher she was started back on Crestor but she now says that she forgets to take this at bedtime and LDL is above 100 again However triglycerides are better even without fenofibrate   Lab Results  Component Value Date   CHOL 167 05/14/2017   HDL 33.70 (L) 05/14/2017   LDLCALC 105 (H) 05/14/2017   LDLDIRECT 117.0 06/04/2016   TRIG 144.0 05/14/2017   CHOLHDL 5 05/14/2017     Allergies as of 06/01/2017   No Known Allergies     Medication List       Accurate as of 06/01/17  9:36 PM. Always use your most recent med list.          BELVIQ 10 MG Tabs Generic drug:  Lorcaserin HCl TAKE 1 TABLET BY MOUTH TWICE A DAY   diclofenac 75 MG  EC tablet Commonly known as:  VOLTAREN Take 75 mg by mouth 2 (two) times daily.   hydroxychloroquine 200 MG tablet Commonly known as:  PLAQUENIL TAKE 1 TABLET BY MOUTH TWICE A DAY WITH FOOD OR MILK   lisinopril-hydrochlorothiazide 10-12.5 MG tablet Commonly known as:  PRINZIDE,ZESTORETIC TAKE 1 TABLET BY MOUTH DAILY.   metFORMIN 750 MG 24 hr tablet Commonly known as:  GLUCOPHAGE-XR Take 3 tablets (2,250 mg total) by mouth daily with breakfast.   phentermine 15 MG capsule Take 1 capsule (15 mg total) by mouth every morning.   rosuvastatin 5 MG tablet Commonly known as:  CRESTOR Take 1 tablet (5 mg total) by mouth daily.   SYNTHROID 75 MCG tablet Generic drug:  levothyroxine TAKE 1 TABLET BY MOUTH DAILY BEFORE BREAKFAST        Allergies: No Known Allergies  No past medical history on file.  No past surgical history on file.  Family History  Problem Relation Age of Onset  . Thyroid disease Mother   . Diabetes Maternal Grandfather   . Hypertension Maternal Grandfather   . Diabetes Maternal Grandmother   . Breast cancer Paternal Grandmother     Social History:  reports that she has never smoked. She has never used smokeless tobacco. She reports that she has current or past drug history. Her alcohol history is not on file.  Review of Systems   She has been told to have rheumatoid arthritis, On Plaquenil   History of PCOS: Her menstrual cycles are regular  Labs:  No visits with results within 1 Week(s) from this visit.  Latest known visit with results is:  Lab on 05/14/2017  Component Date Value Ref Range Status  . Hgb A1c MFr Bld 05/14/2017 5.2  4.6 - 6.5 % Final   Glycemic Control Guidelines for People with Diabetes:Non Diabetic:  <6%Goal of Therapy: <7%Additional Action Suggested:  >8%   . Sodium 05/14/2017 136  135 - 145 mEq/L Final  . Potassium 05/14/2017 4.1  3.5 - 5.1 mEq/L Final  . Chloride 05/14/2017 102  96 - 112 mEq/L Final  . CO2 05/14/2017 27  19 - 32 mEq/L Final  . Glucose, Bld 05/14/2017 100* 70 - 99 mg/dL Final  . BUN 05/14/2017 15  6 - 23 mg/dL Final  . Creatinine, Ser 05/14/2017 0.69  0.40 - 1.20 mg/dL Final  . Total Bilirubin 05/14/2017 0.4  0.2 - 1.2 mg/dL Final  . Alkaline Phosphatase 05/14/2017 55  39 - 117 U/L Final  . AST 05/14/2017 12  0 - 37 U/L Final  . ALT 05/14/2017 13  0 - 35 U/L Final  . Total Protein 05/14/2017 7.4  6.0 - 8.3 g/dL Final  . Albumin 05/14/2017 4.3  3.5 - 5.2 g/dL Final  . Calcium 05/14/2017 9.8  8.4 - 10.5 mg/dL Final  . GFR 05/14/2017 97.64  >60.00 mL/min Final  . Cholesterol 05/14/2017 167  0 - 200 mg/dL Final   ATP III Classification       Desirable:  < 200 mg/dL               Borderline High:  200 - 239 mg/dL          High:  > = 240 mg/dL   . Triglycerides 05/14/2017 144.0  0.0 - 149.0 mg/dL Final   Normal:  <150 mg/dLBorderline High:  150 - 199 mg/dL  . HDL 05/14/2017 33.70* >39.00 mg/dL Final  . VLDL 05/14/2017 28.8  0.0 - 40.0 mg/dL Final  .  LDL Cholesterol 05/14/2017 105* 0 - 99 mg/dL Final  . Total CHOL/HDL Ratio 05/14/2017 5   Final                  Men          Women1/2 Average Risk     3.4          3.3Average Risk          5.0          4.42X Average Risk          9.6          7.13X Average Risk          15.0          11.0                      . NonHDL 05/14/2017 133.76   Final   NOTE:  Non-HDL goal should be 30 mg/dL higher than patient's LDL goal (i.e. LDL goal of < 70 mg/dL, would have non-HDL goal of < 100 mg/dL)  . TSH 05/14/2017 1.54  0.35 - 4.50 uIU/mL Final      EXAM:  BP 122/86   Pulse 88   Ht 5\' 3"  (1.6 m)   Wt 244 lb 3.2 oz (110.8 kg)   SpO2 98%   BMI 43.26 kg/m      Assessment/Plan:   OBESITY: She is gradually gaining back weight despite continuing Belviq She is doing some exercise but not anything formal She is reportedly doing fairly well on diet but unlikely to be consistent because of this continued tendency to weight gain Although she thinks she had dizziness from Qsymia she may still benefit from low-dose phentermine as an option instead of Belviq and she will try this along with monitoring her pulse and blood pressure periodically at work  Hypothyroidism: Adequately controlled   HYPERTENSION: This is well-controlled  HYPERLIPIDEMIA: Still has mild increase in triglycerides but back to normal now She has been irregular with Crestor and LDL is relatively higher in about 100 Discussed that she can take this at her dinnertime  Impaired fasting glucose: Stable A1c in the normal range and glucose is 100   Patient Instructions  Take Crestor at dinner time     [facility] 06/01/2017, 9:36 PM

## 2017-06-01 NOTE — Patient Instructions (Signed)
Take Crestor at dinner time

## 2017-09-04 ENCOUNTER — Ambulatory Visit: Payer: BLUE CROSS/BLUE SHIELD | Admitting: Endocrinology

## 2017-10-05 ENCOUNTER — Other Ambulatory Visit: Payer: Self-pay | Admitting: Endocrinology

## 2017-10-06 ENCOUNTER — Ambulatory Visit: Payer: BLUE CROSS/BLUE SHIELD | Admitting: Endocrinology

## 2017-10-21 ENCOUNTER — Ambulatory Visit: Payer: BLUE CROSS/BLUE SHIELD | Admitting: Endocrinology

## 2017-10-21 ENCOUNTER — Encounter: Payer: Self-pay | Admitting: Endocrinology

## 2017-10-21 DIAGNOSIS — R7301 Impaired fasting glucose: Secondary | ICD-10-CM | POA: Diagnosis not present

## 2017-10-21 DIAGNOSIS — E063 Autoimmune thyroiditis: Secondary | ICD-10-CM

## 2017-10-21 MED ORDER — LORCASERIN HCL ER 20 MG PO TB24: 20.0000 mg | ORAL_TABLET | Freq: Every day | ORAL | 2 refills | Status: DC

## 2017-10-21 NOTE — Progress Notes (Signed)
Patient ID: Nicole Gorelick, female   DOB: 06-24-1971, 46 y.o.   MRN: [id number]    Chief complaint: Followup of various problems  History of Present Illness:  OBESITY:    On her August 2016 visit because of inability to lose weight on her own she was started on Belviq 10 mg twice a day. She is tolerating this, has been compliant with taking it twice a day  The medication in the past has controlled  her appetite and she has reduced her portions  She had previously lost over 20 pounds with baseline weight of 258  However subsequently her weight has has been more difficult to control and progressively increasing Because of her weight gain she was changed to phentermine 15 mg daily However she does not think she can tolerate this daily and this makes her jittery He does help her with her appetite but she cannot take this consistently She has however started walking on the treadmill or going up and down steps She thinks she is planning her meals well also and her weight has leveled off   Wt Readings from Last 3 Encounters:  10/21/17 245 lb 6.4 oz (111.3 kg)  06/01/17 244 lb 3.2 oz (110.8 kg)  10/09/16 240 lb (108.9 kg)    HYPERTENSION: She has had hypertension since at least 2004. Has been on ACE inhibitor since about 2005 Because of swelling of her legs her ACE inhibitor was changed to low-dose Lotensin HCTZ and subsequently to Zestoretic10/12.5.  Blood pressure well-controlled, she is able to monitor this occasionally at work Blood pressure did not go up with using phentermine as above   IMPAIRED FASTING GLUCOSE: Her glucose was high at 109 previously   She has been  on metformin ER 750 mg, has difficulty tolerating higher doses   A1c has been quite normal below the pre-diabetic range and glucose was below 100 on the last measurement   Lab Results  Component Value Date   HGBA1C 5.2 05/14/2017   HGBA1C 5.2 10/06/2016   HGBA1C 5.3 01/31/2016   Lab Results    Component Value Date   LDLCALC 105 (H) 05/14/2017   CREATININE 0.69 05/14/2017    HYPOTHYROIDISM:  She has had a goiter since at least 1998. Baseline TSH was 5.4 when she was started on levothyroxine in 2000 and has required small doses of levothyroxine Has not required a change in her 75 g dose which has been consistent for quite some time     Lab Results  Component Value Date   TSH 1.54 05/14/2017   TSH 1.45 01/31/2016   TSH 3.10 07/09/2015   FREET4 1.07 01/31/2016   FREET4 0.81 03/07/2014   FREET4 0.84 09/09/2013      HYPERLIPIDEMIA: She has had diabetic dyslipidemia with HDL as low as 23 and high triglycerides. Previously on Crestor and fenofibrate combination, subsequently Crestor was stopped because of improved LDL  Since LDL was relatively higher she was started back on Crestor More recently she is trying to take this at bedtime and is more regular with this Last triglycerides are better even without fenofibrate   Lab Results  Component Value Date   CHOL 167 05/14/2017   HDL 33.70 (L) 05/14/2017   LDLCALC 105 (H) 05/14/2017   LDLDIRECT 117.0 06/04/2016   TRIG 144.0 05/14/2017   CHOLHDL 5 05/14/2017     Allergies as of 10/21/2017   No Known Allergies     Medication List  Accurate as of 10/21/17  4:36 PM. Always use your most recent med list.          BELVIQ 10 MG Tabs Generic drug:  Lorcaserin HCl TAKE 1 TABLET BY MOUTH TWICE A DAY   diclofenac 75 MG EC tablet Commonly known as:  VOLTAREN Take 75 mg by mouth 2 (two) times daily.   hydroxychloroquine 200 MG tablet Commonly known as:  PLAQUENIL TAKE 1 TABLET BY MOUTH TWICE A DAY WITH FOOD OR MILK   lisinopril-hydrochlorothiazide 10-12.5 MG tablet Commonly known as:  PRINZIDE,ZESTORETIC TAKE 1 TABLET BY MOUTH DAILY.   metFORMIN 750 MG 24 hr tablet Commonly known as:  GLUCOPHAGE-XR Take 3 tablets (2,250 mg total) by mouth daily with breakfast.   phentermine 15 MG capsule Take 1  capsule (15 mg total) by mouth every morning.   rosuvastatin 5 MG tablet Commonly known as:  CRESTOR Take 1 tablet (5 mg total) by mouth daily.   SYNTHROID 75 MCG tablet Generic drug:  levothyroxine TAKE 1 TABLET BY MOUTH DAILY BEFORE BREAKFAST       Allergies: No Known Allergies  No past medical history on file.  No past surgical history on file.  Family History  Problem Relation Age of Onset  . Thyroid disease Mother   . Diabetes Maternal Grandfather   . Hypertension Maternal Grandfather   . Diabetes Maternal Grandmother   . Breast cancer Paternal Grandmother     Social History:  reports that  has never smoked. she has never used smokeless tobacco. She reports that she has current or past drug history. Her alcohol history is not on file.  Review of Systems   She has been told to have rheumatoid arthritis, On Plaquenil   History of PCOS: Her menstrual cycles are regular  Labs:  No visits with results within 1 Week(s) from this visit.  Latest known visit with results is:  Lab on 05/14/2017  Component Date Value Ref Range Status  . Hgb A1c MFr Bld 05/14/2017 5.2  4.6 - 6.5 % Final   Glycemic Control Guidelines for People with Diabetes:Non Diabetic:  <6%Goal of Therapy: <7%Additional Action Suggested:  >8%   . Sodium 05/14/2017 136  135 - 145 mEq/L Final  . Potassium 05/14/2017 4.1  3.5 - 5.1 mEq/L Final  . Chloride 05/14/2017 102  96 - 112 mEq/L Final  . CO2 05/14/2017 27  19 - 32 mEq/L Final  . Glucose, Bld 05/14/2017 100* 70 - 99 mg/dL Final  . BUN 05/14/2017 15  6 - 23 mg/dL Final  . Creatinine, Ser 05/14/2017 0.69  0.40 - 1.20 mg/dL Final  . Total Bilirubin 05/14/2017 0.4  0.2 - 1.2 mg/dL Final  . Alkaline Phosphatase 05/14/2017 55  39 - 117 U/L Final  . AST 05/14/2017 12  0 - 37 U/L Final  . ALT 05/14/2017 13  0 - 35 U/L Final  . Total Protein 05/14/2017 7.4  6.0 - 8.3 g/dL Final  . Albumin 05/14/2017 4.3  3.5 - 5.2 g/dL Final  . Calcium 05/14/2017 9.8   8.4 - 10.5 mg/dL Final  . GFR 05/14/2017 97.64  >60.00 mL/min Final  . Cholesterol 05/14/2017 167  0 - 200 mg/dL Final   ATP III Classification       Desirable:  < 200 mg/dL               Borderline High:  200 - 239 mg/dL          High:  > = 240  mg/dL  . Triglycerides 05/14/2017 144.0  0.0 - 149.0 mg/dL Final   Normal:  <150 mg/dLBorderline High:  150 - 199 mg/dL  . HDL 05/14/2017 33.70* >39.00 mg/dL Final  . VLDL 05/14/2017 28.8  0.0 - 40.0 mg/dL Final  . LDL Cholesterol 05/14/2017 105* 0 - 99 mg/dL Final  . Total CHOL/HDL Ratio 05/14/2017 5   Final                  Men          Women1/2 Average Risk     3.4          3.3Average Risk          5.0          4.42X Average Risk          9.6          7.13X Average Risk          15.0          11.0                      . NonHDL 05/14/2017 133.76   Final   NOTE:  Non-HDL goal should be 30 mg/dL higher than patient's LDL goal (i.e. LDL goal of < 70 mg/dL, would have non-HDL goal of < 100 mg/dL)  . TSH 05/14/2017 1.54  0.35 - 4.50 uIU/mL Final      EXAM:  BP 120/68   Pulse 84   Ht 5\' 3"  (1.6 m)   Wt 245 lb 6.4 oz (111.3 kg)   SpO2 98%   BMI 43.47 kg/m      Assessment/Plan:   OBESITY:  Although she is taking phentermine low-dose she is not taking this daily and does not appear to have any weight loss with this However she is not gaining weight as she was before This is probably from her doing more exercise Also usually trying to watch her diet  Since she cannot tolerate phentermine she can go back to the Belviq again as she wants to try this Apparently Saxenda and Contrave are not covered by her insurance  Hypothyroidism: Adequately controlled previously and will need follow-up   HYPERTENSION: This is well-controlled  HYPERLIPIDEMIA: Follow-up on the next visit     There are no Patient Instructions on file for this visit.    Jhane Lorio 10/21/2017, 4:36 PM

## 2017-10-29 ENCOUNTER — Telehealth: Payer: Self-pay | Admitting: Endocrinology

## 2017-10-29 NOTE — Telephone Encounter (Signed)
Nicole Schwartz from [facility] called for pt BMI to approved medication. Per CMA, LW, reviewing chart pt BMI 43.47 kg/m.

## 2017-11-05 ENCOUNTER — Telehealth: Payer: Self-pay

## 2017-11-05 NOTE — Telephone Encounter (Signed)
Patient has active prescription at [facility] [address]

## 2017-11-05 NOTE — Telephone Encounter (Signed)
PA for Belviq XR 20mg  has been approved until 04/25/18 and patient has been notified

## 2017-11-13 IMAGING — MG 2D DIGITAL DIAGNOSTIC UNILATERAL RIGHT MAMMOGRAM WITH CAD AND AD
6 series · 6 of 14 positions shown · non-contrast
Comparison: Previous exam(s).

CLINICAL DATA: Screening recall for possible right breast mass.

EXAM:
2D DIGITAL DIAGNOSTIC UNILATERAL RIGHT MAMMOGRAM WITH CAD AND
ADJUNCT TOMO
RIGHT BREAST ULTRASOUND

[R ML]
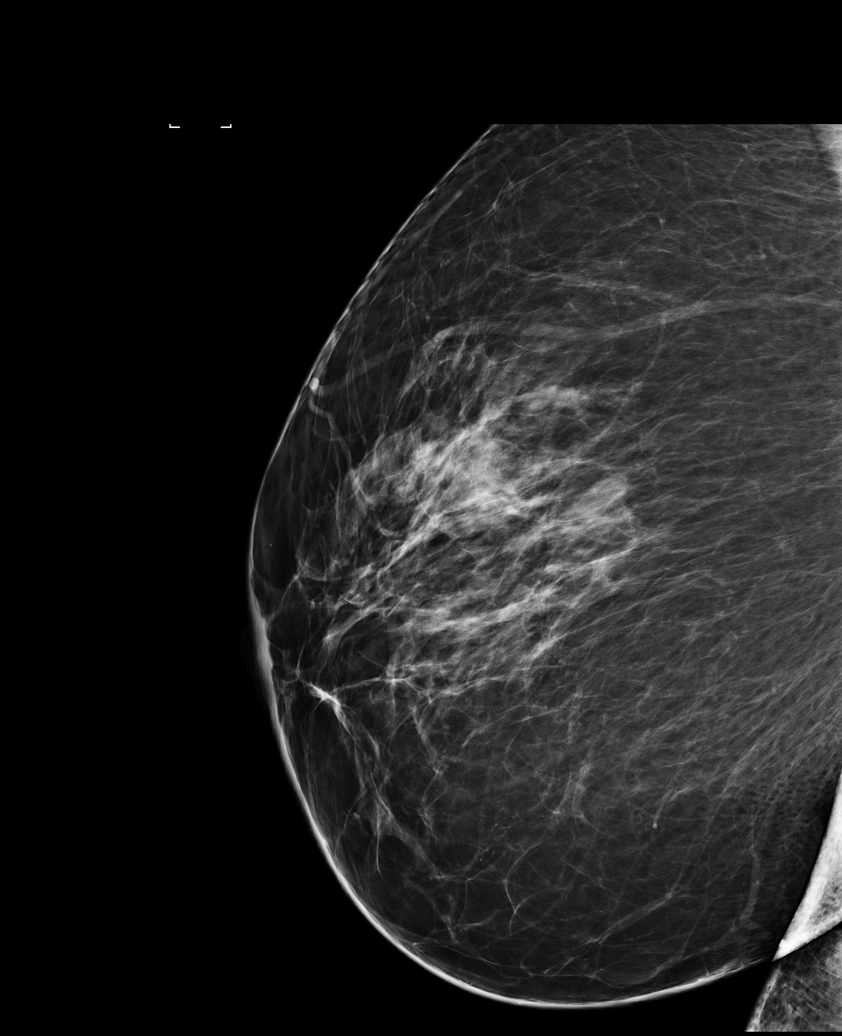

[R ML synth-2D]
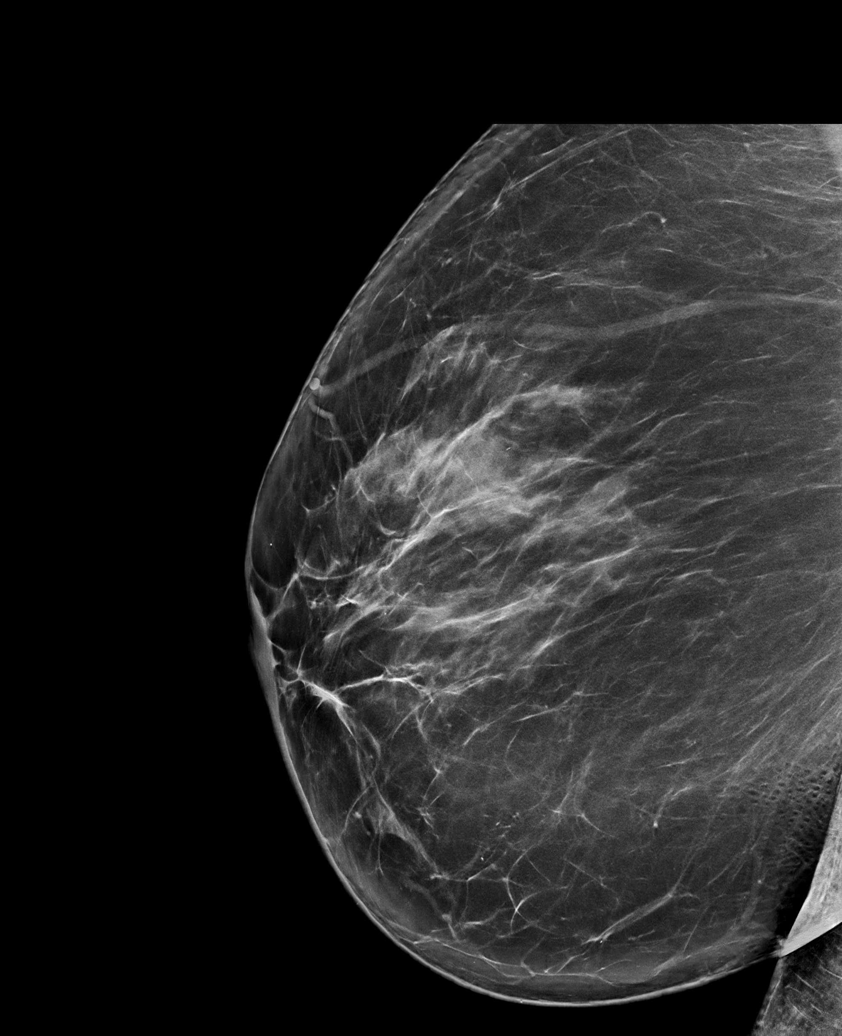

[R CC]
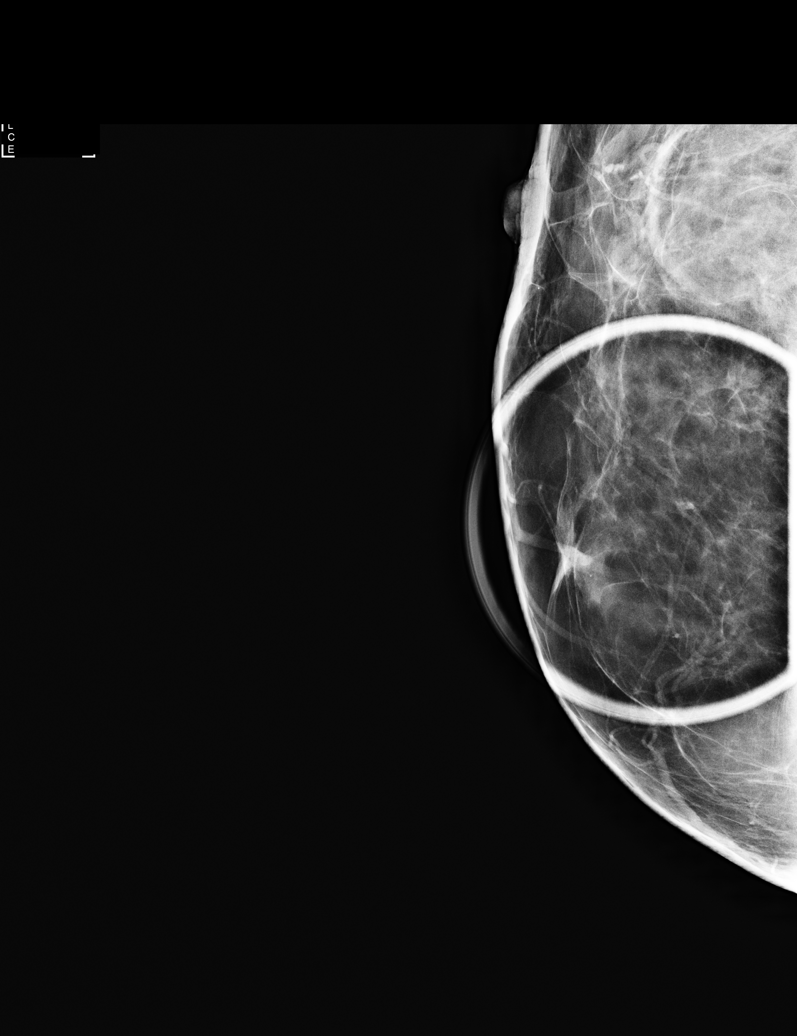

[R CC synth-2D]
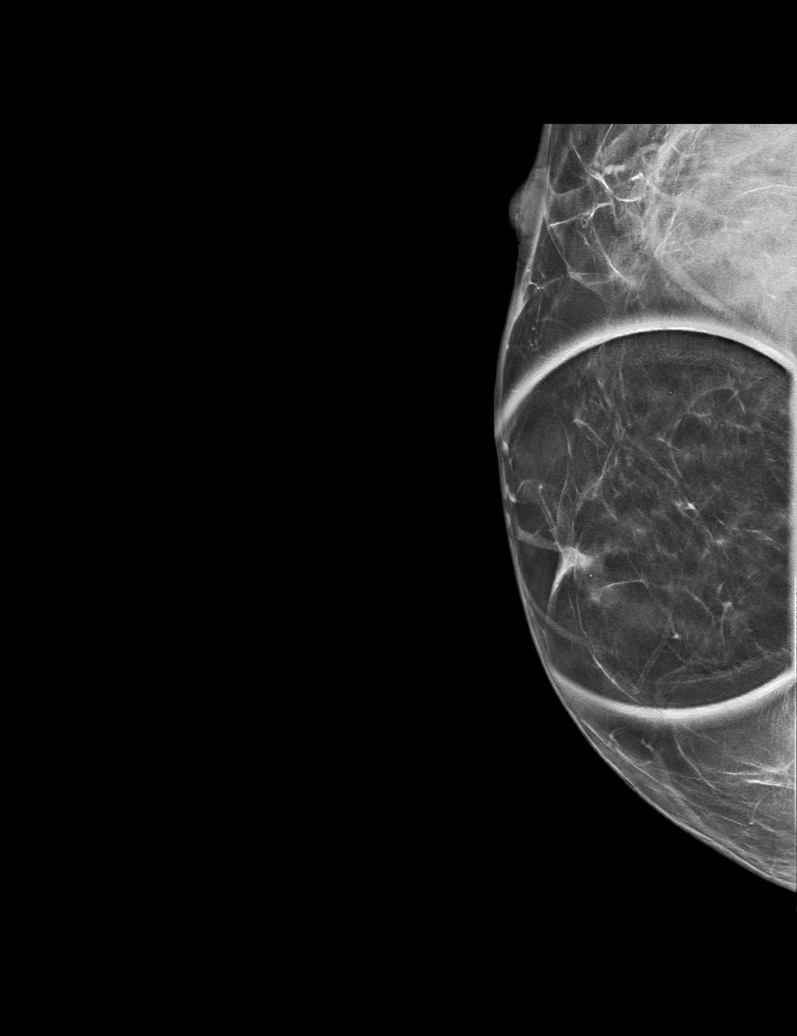

[R ML tomo · tomo slice 47/94.0]
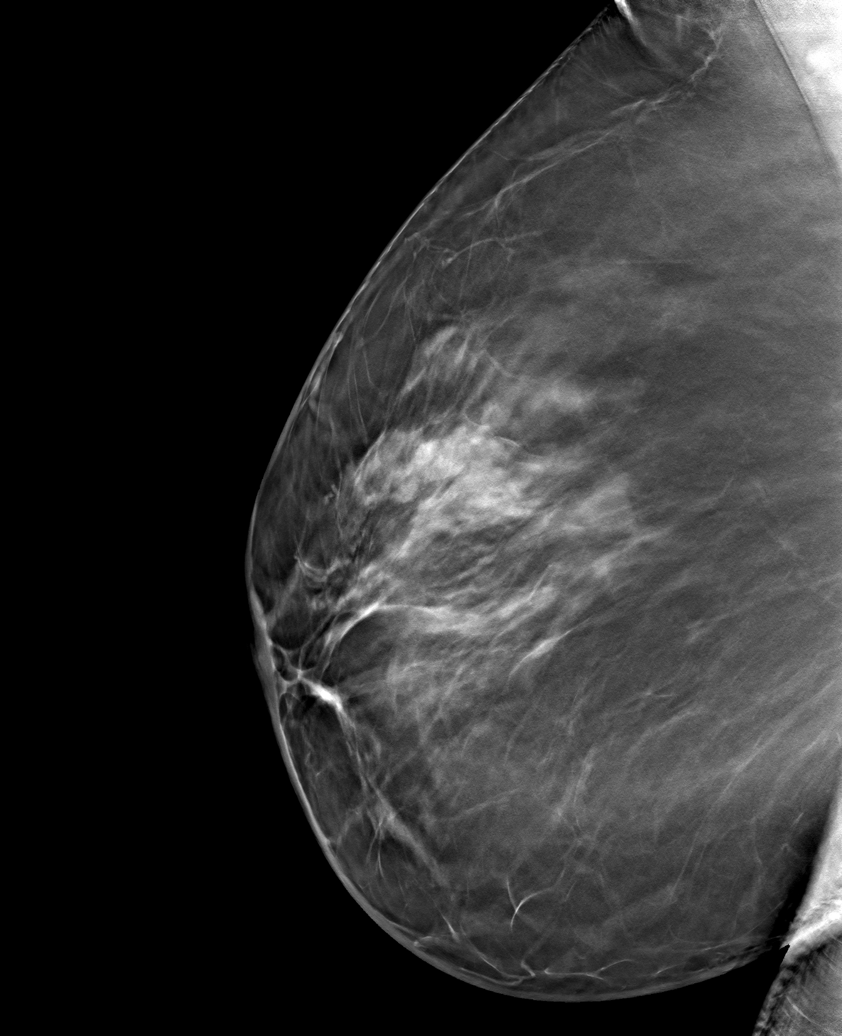

[R CC tomo · tomo slice 27/54.0]
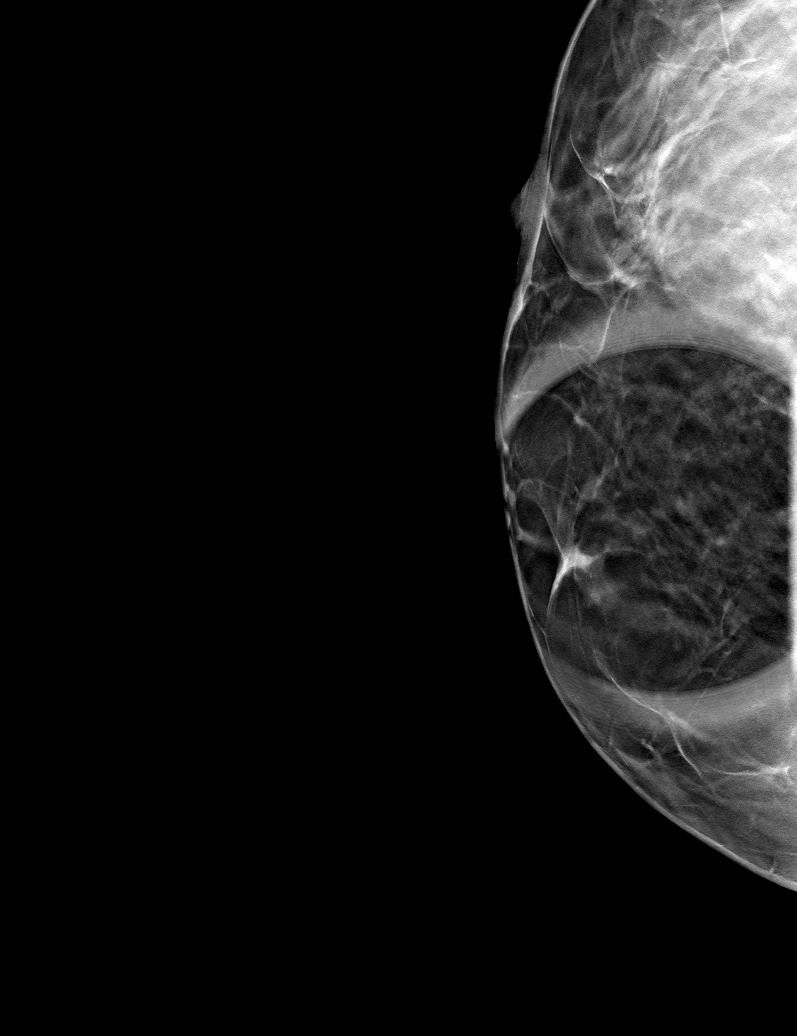

[6 of 14 positions shown; findings below may reference images not displayed]

ACR Breast Density Category b: There are scattered areas of
fibroglandular density.
FINDINGS: ML tomograms as well as spot compression CC tomograms were performed
of the right breast. There is a persistent asymmetry/possible mass
with obscured margins in the inner right breast measuring
approximately 5 mm. 1-2 adjacent punctate calcifications are
present.

Mammographic images were processed with CAD.

Physical examination of the inner right breast does not reveal any
palpable masses.

Targeted ultrasound of the right breast was performed demonstrating
a cluster of microcysts at the [DATE] position 6 cm from nipple
measuring 0.5 x 0.2 x 0.4 cm. This corresponds well with the mass
seen at mammography.
IMPRESSION: Right breast cluster of cysts. There are no findings of malignancy
in the right breast.

RECOMMENDATION:
Screening mammogram in one year.(Code:JC-R-KA8)

I have discussed the findings and recommendations with the patient.
Results were also provided in writing at the conclusion of the
visit. If applicable, a reminder letter will be sent to the patient
regarding the next appointment.

BI-RADS CATEGORY  2: Benign.

## 2017-11-21 ENCOUNTER — Other Ambulatory Visit: Payer: Self-pay | Admitting: Endocrinology

## 2017-11-23 ENCOUNTER — Other Ambulatory Visit: Payer: Self-pay

## 2018-01-18 ENCOUNTER — Other Ambulatory Visit (INDEPENDENT_AMBULATORY_CARE_PROVIDER_SITE_OTHER): Payer: BLUE CROSS/BLUE SHIELD

## 2018-01-18 DIAGNOSIS — E063 Autoimmune thyroiditis: Secondary | ICD-10-CM

## 2018-01-18 DIAGNOSIS — E782 Mixed hyperlipidemia: Secondary | ICD-10-CM

## 2018-01-18 DIAGNOSIS — R7301 Impaired fasting glucose: Secondary | ICD-10-CM | POA: Diagnosis not present

## 2018-01-18 LAB — TSH: TSH: 1.89 u[IU]/mL (ref 0.35–4.50)

## 2018-01-18 LAB — HEMOGLOBIN A1C: Hgb A1c MFr Bld: 5.3 % (ref 4.6–6.5)

## 2018-01-18 LAB — LIPID PANEL
Cholesterol: 189 mg/dL (ref 0–200)
HDL: 38 mg/dL — ABNORMAL LOW (ref >39.00)
NonHDL: 151.05
Total CHOL/HDL Ratio: 5
Triglycerides: 227 mg/dL — ABNORMAL HIGH (ref 0.0–149.0)
VLDL: 45.4 mg/dL — ABNORMAL HIGH (ref 0.0–40.0)

## 2018-01-18 LAB — LDL CHOLESTEROL, DIRECT: Direct LDL: 119 mg/dL

## 2018-01-18 LAB — GLUCOSE, RANDOM: Glucose, Bld: 110 mg/dL — ABNORMAL HIGH (ref 70–99)

## 2018-01-21 ENCOUNTER — Encounter: Payer: Self-pay | Admitting: Endocrinology

## 2018-01-21 ENCOUNTER — Ambulatory Visit: Payer: BLUE CROSS/BLUE SHIELD | Admitting: Endocrinology

## 2018-01-21 VITALS — BP 118/78 | HR 97 | Ht 63.0 in | Wt 244.6 lb

## 2018-01-21 DIAGNOSIS — E063 Autoimmune thyroiditis: Secondary | ICD-10-CM | POA: Diagnosis not present

## 2018-01-21 DIAGNOSIS — E782 Mixed hyperlipidemia: Secondary | ICD-10-CM

## 2018-01-21 DIAGNOSIS — R7303 Prediabetes: Secondary | ICD-10-CM

## 2018-01-21 NOTE — Patient Instructions (Signed)
USE METFORMIN 2X DAILY   Crestor 10mg 

## 2018-01-21 NOTE — Progress Notes (Signed)
Patient ID: Nicole Schwartz, female   DOB: 01/18/1971, 47 y.o.   MRN: [id number]    Chief complaint: Followup of various problems  History of Present Illness:  OBESITY:    On her August 2016 visit because of inability to lose weight on her own she was started on Belviq 10 mg twice a day. She is tolerating this, has been compliant with taking it twice a day  The medication in the past has controlled  her appetite and she has reduced her portions  She had previously lost over 20 pounds with baseline weight of 258  However subsequently her weight has has been more difficult to control  Since she had not benefited from phentermine 15 mg daily and was not tolerating this she has been on BELVIQ since 10/2017 Her weight is the same She does not think it helps her as has much with her appetite control However on the last visit she was exercising much more regularly and she has not done so  She thinks she is planning her meals well also   Wt Readings from Last 3 Encounters:  01/21/18 244 lb 9.6 oz (110.9 kg)  10/21/17 245 lb 6.4 oz (111.3 kg)  06/01/17 244 lb 3.2 oz (110.8 kg)    HYPERTENSION: She has had hypertension since at least 2004. Has been on ACE inhibitor since about 2005 Because of swelling of her legs her ACE inhibitor was changed to low-dose Lotensin HCTZ and subsequently to Zestoretic10/12.5.  Blood pressure well-controlled  BP Readings from Last 3 Encounters:  01/21/18 118/78  10/21/17 120/68  06/01/17 122/86      IMPAIRED FASTING GLUCOSE: Her glucose is 110 fasting She does try to watch her diet although not consistently over the last 3 months She has been  on metformin ER 750 mg, has difficulty tolerating higher doses   A1c has been quite normal below the pre-diabetic range consistently   Lab Results  Component Value Date   HGBA1C 5.3 01/18/2018   HGBA1C 5.2 05/14/2017   HGBA1C 5.2 10/06/2016   Lab Results  Component Value Date   LDLCALC 105  (H) 05/14/2017   CREATININE 0.69 05/14/2017    HYPOTHYROIDISM:  She has had a goiter since at least 1998. Baseline TSH was 5.4 when she was started on levothyroxine in 2000 and has required small doses of levothyroxine Has not required a change in her 75 g dose for the last few years TSH again normal   Lab Results  Component Value Date   TSH 1.89 01/18/2018   TSH 1.54 05/14/2017   TSH 1.45 01/31/2016   FREET4 1.07 01/31/2016   FREET4 0.81 03/07/2014   FREET4 0.84 09/09/2013      HYPERLIPIDEMIA: She has had diabetic dyslipidemia with HDL as low as 23 and high triglycerides. Previously on Crestor and fenofibrate combination, subsequently Crestor was stopped because of improved LDL  Since LDL was relatively higher she was started back on Crestor  Despite taking this regularly her LDL is still over 100 and triglycerides are higher now   Lab Results  Component Value Date   CHOL 189 01/18/2018   HDL 38.00 (L) 01/18/2018   LDLCALC 105 (H) 05/14/2017   LDLDIRECT 119.0 01/18/2018   TRIG 227.0 (H) 01/18/2018   CHOLHDL 5 01/18/2018     Allergies as of 01/21/2018   No Known Allergies     Medication List        Accurate as of 01/21/18  3:49 PM. Always use  your most recent med list.          diclofenac 75 MG EC tablet Commonly known as:  VOLTAREN Take 75 mg by mouth 2 (two) times daily.   hydroxychloroquine 200 MG tablet Commonly known as:  PLAQUENIL TAKE 1 TABLET BY MOUTH TWICE A DAY WITH FOOD OR MILK   lisinopril-hydrochlorothiazide 10-12.5 MG tablet Commonly known as:  PRINZIDE,ZESTORETIC TAKE 1 TABLET BY MOUTH DAILY.   Lorcaserin HCl ER 20 MG Tb24 Commonly known as:  BELVIQ XR Take 20 mg by mouth daily.   metFORMIN 750 MG 24 hr tablet Commonly known as:  GLUCOPHAGE-XR Take 3 tablets (2,250 mg total) by mouth daily with breakfast.   phentermine 15 MG capsule Take 1 capsule (15 mg total) by mouth every morning.   rosuvastatin 5 MG tablet Commonly known  as:  CRESTOR Take 1 tablet (5 mg total) by mouth daily.   SYNTHROID 75 MCG tablet Generic drug:  levothyroxine TAKE 1 TABLET BY MOUTH DAILY BEFORE BREAKFAST       Allergies: No Known Allergies  No past medical history on file.  No past surgical history on file.  Family History  Problem Relation Age of Onset  . Thyroid disease Mother   . Diabetes Maternal Grandfather   . Hypertension Maternal Grandfather   . Diabetes Maternal Grandmother   . Breast cancer Paternal Grandmother     Social History:  reports that  has never smoked. she has never used smokeless tobacco. She reports that she has current or past drug history. Her alcohol history is not on file.  Review of Systems   She has been told to have rheumatoid arthritis, On Plaquenil   History of PCOS: Her menstrual cycles are regular  Labs:  Lab on 01/18/2018  Component Date Value Ref Range Status  . TSH 01/18/2018 1.89  0.35 - 4.50 uIU/mL Final  . Hgb A1c MFr Bld 01/18/2018 5.3  4.6 - 6.5 % Final   Glycemic Control Guidelines for People with Diabetes:Non Diabetic:  <6%Goal of Therapy: <7%Additional Action Suggested:  >8%   . Cholesterol 01/18/2018 189  0 - 200 mg/dL Final   ATP III Classification       Desirable:  < 200 mg/dL               Borderline High:  200 - 239 mg/dL          High:  > = 240 mg/dL  . Triglycerides 01/18/2018 227.0* 0.0 - 149.0 mg/dL Final   Normal:  <150 mg/dLBorderline High:  150 - 199 mg/dL  . HDL 01/18/2018 38.00* >39.00 mg/dL Final  . VLDL 01/18/2018 45.4* 0.0 - 40.0 mg/dL Final  . Total CHOL/HDL Ratio 01/18/2018 5   Final                  Men          Women1/2 Average Risk     3.4          3.3Average Risk          5.0          4.42X Average Risk          9.6          7.13X Average Risk          15.0          11.0                      .  NonHDL 01/18/2018 151.05   Final   NOTE:  Non-HDL goal should be 30 mg/dL higher than patient's LDL goal (i.e. LDL goal of < 70 mg/dL, would have non-HDL  goal of < 100 mg/dL)  . Glucose, Bld 01/18/2018 110* 70 - 99 mg/dL Final  . Direct LDL 01/18/2018 119.0  mg/dL Final   Optimal:  <100 mg/dLNear or Above Optimal:  100-129 mg/dLBorderline High:  130-159 mg/dLHigh:  160-189 mg/dLVery High:  >190 mg/dL      EXAM:  BP 118/78   Pulse 97   Ht 5\' 3"  (1.6 m)   Wt 244 lb 9.6 oz (110.9 kg)   LMP 12/23/2017   SpO2 98%   BMI 43.33 kg/m      Assessment/Plan:   OBESITY:  Although she is taking Belviq regularly she does not have any weight loss She is trying to watch her diet, may not be consistent over the last 3 months However has not exercised as much She thinks she can try better with exercise and consistent diet but also wants to continue Belviq for now  Hypothyroidism: Adequately controlled previously and will need follow-up   HYPERTENSION: This is well-controlled on Zestoretic  HYPERLIPIDEMIA: LDL is above 100 and triglycerides are higher She will increase her Crestor to 10 mg for better control Again follow-up on the next visit  Impaired fasting glucose: Her blood sugar is 110 although A1c is good She will try to take metformin twice a day now to see if she can tolerate this   There are no Patient Instructions on file for this visit.    Elayne Snare 01/21/2018, 3:49 PM

## 2018-01-27 ENCOUNTER — Other Ambulatory Visit: Payer: Self-pay | Admitting: Endocrinology

## 2018-01-28 ENCOUNTER — Other Ambulatory Visit: Payer: Self-pay

## 2018-01-28 MED ORDER — ROSUVASTATIN CALCIUM 10 MG PO TABS: 10.0000 mg | ORAL_TABLET | Freq: Every day | ORAL | 2 refills | Status: DC

## 2018-01-28 MED ORDER — METFORMIN HCL ER 750 MG PO TB24: ORAL_TABLET | ORAL | 2 refills | Status: DC

## 2018-01-28 NOTE — Telephone Encounter (Signed)
Left a VM for patient to give the office a call back regarding whether or not she is still taking this medication.

## 2018-02-16 ENCOUNTER — Other Ambulatory Visit: Payer: Self-pay | Admitting: Endocrinology

## 2018-04-13 ENCOUNTER — Telehealth: Payer: Self-pay

## 2018-04-13 ENCOUNTER — Telehealth: Payer: Self-pay | Admitting: Endocrinology

## 2018-04-13 NOTE — Telephone Encounter (Signed)
Contacted pt in regards to her updated pharmacy coverage information, [employer] stated that she did not renew coverage thru them. Need to know who she has pharmacy coverage with if pt returns call.

## 2018-04-13 NOTE — Telephone Encounter (Signed)
Patient is retuning Nicole Schwartz's call

## 2018-05-13 ENCOUNTER — Other Ambulatory Visit: Payer: Self-pay | Admitting: Endocrinology

## 2018-05-21 ENCOUNTER — Other Ambulatory Visit: Payer: BLUE CROSS/BLUE SHIELD

## 2018-05-25 ENCOUNTER — Ambulatory Visit: Payer: BLUE CROSS/BLUE SHIELD | Admitting: Endocrinology

## 2018-06-10 ENCOUNTER — Other Ambulatory Visit (INDEPENDENT_AMBULATORY_CARE_PROVIDER_SITE_OTHER): Payer: 59

## 2018-06-10 DIAGNOSIS — E782 Mixed hyperlipidemia: Secondary | ICD-10-CM | POA: Diagnosis not present

## 2018-06-10 LAB — COMPREHENSIVE METABOLIC PANEL
ALBUMIN: 4.3 g/dL (ref 3.5–5.2)
ALT: 17 U/L (ref 0–35)
AST: 16 U/L (ref 0–37)
Alkaline Phosphatase: 57 U/L (ref 39–117)
BILIRUBIN TOTAL: 0.4 mg/dL (ref 0.2–1.2)
BUN: 13 mg/dL (ref 6–23)
CALCIUM: 9.5 mg/dL (ref 8.4–10.5)
CO2: 30 mEq/L (ref 19–32)
CREATININE: 0.73 mg/dL (ref 0.40–1.20)
Chloride: 99 mEq/L (ref 96–112)
GFR: 91.06 mL/min (ref >60.00)
GLUCOSE: 105 mg/dL — AB (ref 70–99)
Potassium: 3.8 mEq/L (ref 3.5–5.1)
Sodium: 136 mEq/L (ref 135–145)
Total Protein: 7.4 g/dL (ref 6.0–8.3)

## 2018-06-10 LAB — LIPID PANEL
Cholesterol: 179 mg/dL (ref 0–200)
HDL: 42.4 mg/dL (ref >39.00)
LDL Cholesterol: 98 mg/dL (ref 0–99)
NonHDL: 136.99
Total CHOL/HDL Ratio: 4
Triglycerides: 195 mg/dL — ABNORMAL HIGH (ref 0.0–149.0)
VLDL: 39 mg/dL (ref 0.0–40.0)

## 2018-06-15 ENCOUNTER — Encounter: Payer: Self-pay | Admitting: Endocrinology

## 2018-06-15 ENCOUNTER — Ambulatory Visit: Payer: Self-pay | Admitting: Endocrinology

## 2018-06-15 VITALS — BP 122/78 | HR 86 | Ht 63.0 in | Wt 247.6 lb

## 2018-06-15 DIAGNOSIS — E782 Mixed hyperlipidemia: Secondary | ICD-10-CM | POA: Diagnosis not present

## 2018-06-15 DIAGNOSIS — E063 Autoimmune thyroiditis: Secondary | ICD-10-CM

## 2018-06-15 DIAGNOSIS — E282 Polycystic ovarian syndrome: Secondary | ICD-10-CM

## 2018-06-15 DIAGNOSIS — R7303 Prediabetes: Secondary | ICD-10-CM

## 2018-06-15 NOTE — Patient Instructions (Signed)
Take Metformin 2x daily  Call [id number]

## 2018-06-15 NOTE — Progress Notes (Signed)
Patient ID: Nicole Schwartz, female   DOB: 1971/07/19, 47 y.o.   MRN: [id number]    Chief complaint: Followup of various problems  History of Present Illness:  OBESITY:    In 8/16 she was started on Belviq 10 mg twice a day. The medication in the past has controlled  her appetite and she has reduced her portions  She had previously lost over 20 pounds with baseline weight of 258  However her insurance does not cover this and she has not taken it for about 3 months However has gone up 3 pounds Overall her weight has has been more difficult to control  Also she had not benefited from phentermine 15 mg daily and was not tolerating  She thinks she is planning her meals with low fat content However exercising only 2 or 3 times a week with walking in the evenings   Wt Readings from Last 3 Encounters:  06/15/18 247 lb 9.6 oz (112.3 kg)  01/21/18 244 lb 9.6 oz (110.9 kg)  10/21/17 245 lb 6.4 oz (111.3 kg)    HYPERTENSION: She has had hypertension since at least 2004. Has been on ACE inhibitor since about 2005 Because of swelling of her legs her ACE inhibitor was changed to low-dose Lotensin HCTZ and subsequently to Zestoretic10/12.5.  Blood pressure consistently well-controlled  BP Readings from Last 3 Encounters:  06/15/18 122/78  01/21/18 118/78  10/21/17 120/68      IMPAIRED FASTING GLUCOSE: Her glucose is now 105 fasting She does try to watch her diet although not consistently over the last 3 months She has been  on metformin ER 750 mg, has difficulty tolerating higher doses but has not tried taking 1 tablet twice daily   A1c has been quite normal below the pre-diabetic range consistently   Lab Results  Component Value Date   HGBA1C 5.3 01/18/2018   HGBA1C 5.2 05/14/2017   HGBA1C 5.2 10/06/2016   Lab Results  Component Value Date   LDLCALC 98 06/10/2018   CREATININE 0.73 06/10/2018    HYPOTHYROIDISM:  She has had a goiter since at least 1998. Baseline  TSH was 5.4 when she was started on levothyroxine in 2000 and has required small doses of levothyroxine Has not required a change in her 75 g dose for the last few years TSH last normal   Lab Results  Component Value Date   TSH 1.89 01/18/2018   TSH 1.54 05/14/2017   TSH 1.45 01/31/2016   FREET4 1.07 01/31/2016   FREET4 0.81 03/07/2014   FREET4 0.84 09/09/2013      HYPERLIPIDEMIA: She has had diabetic dyslipidemia with HDL as low as 23 and high triglycerides. Previously on Crestor and fenofibrate combination  Since LDL was relatively higher she was started back on Crestor and this was increased in 2/19 since LDL was 117  With this both her triglycerides and LDL are better   Lab Results  Component Value Date   CHOL 179 06/10/2018   HDL 42.40 06/10/2018   LDLCALC 98 06/10/2018   LDLDIRECT 119.0 01/18/2018   TRIG 195.0 (H) 06/10/2018   CHOLHDL 4 06/10/2018     Allergies as of 06/15/2018   No Known Allergies     Medication List        Accurate as of 06/15/18  8:01 AM. Always use your most recent med list.          diclofenac 75 MG EC tablet Commonly known as:  VOLTAREN Take 75 mg by  mouth 2 (two) times daily.   hydroxychloroquine 200 MG tablet Commonly known as:  PLAQUENIL TAKE 1 TABLET BY MOUTH TWICE A DAY WITH FOOD OR MILK   lisinopril-hydrochlorothiazide 10-12.5 MG tablet Commonly known as:  PRINZIDE,ZESTORETIC TAKE 1 TABLET BY MOUTH DAILY.   metFORMIN 750 MG 24 hr tablet Commonly known as:  GLUCOPHAGE-XR Take 2 tablets daily   rosuvastatin 10 MG tablet Commonly known as:  CRESTOR Take 1 tablet (10 mg total) by mouth daily.   SYNTHROID 75 MCG tablet Generic drug:  levothyroxine TAKE 1 TABLET BY MOUTH DAILY BEFORE BREAKFAST       Allergies: No Known Allergies  No past medical history on file.  No past surgical history on file.  Family History  Problem Relation Age of Onset  . Thyroid disease Mother   . Diabetes Maternal Grandfather   .  Hypertension Maternal Grandfather   . Diabetes Maternal Grandmother   . Breast cancer Paternal Grandmother     Social History:  reports that she has never smoked. She has never used smokeless tobacco. She reports that . Her alcohol history is not on file.  Review of Systems   She has been told to have rheumatoid arthritis, On Plaquenil   History of PCOS: Her menstrual cycles are regular  Labs:  Lab on 06/10/2018  Component Date Value Ref Range Status  . Cholesterol 06/10/2018 179  0 - 200 mg/dL Final   ATP III Classification       Desirable:  < 200 mg/dL               Borderline High:  200 - 239 mg/dL          High:  > = 240 mg/dL  . Triglycerides 06/10/2018 195.0* 0.0 - 149.0 mg/dL Final   Normal:  <150 mg/dLBorderline High:  150 - 199 mg/dL  . HDL 06/10/2018 42.40  >39.00 mg/dL Final  . VLDL 06/10/2018 39.0  0.0 - 40.0 mg/dL Final  . LDL Cholesterol 06/10/2018 98  0 - 99 mg/dL Final  . Total CHOL/HDL Ratio 06/10/2018 4   Final                  Men          Women1/2 Average Risk     3.4          3.3Average Risk          5.0          4.42X Average Risk          9.6          7.13X Average Risk          15.0          11.0                      . NonHDL 06/10/2018 136.99   Final   NOTE:  Non-HDL goal should be 30 mg/dL higher than patient's LDL goal (i.e. LDL goal of < 70 mg/dL, would have non-HDL goal of < 100 mg/dL)  . Sodium 06/10/2018 136  135 - 145 mEq/L Final  . Potassium 06/10/2018 3.8  3.5 - 5.1 mEq/L Final  . Chloride 06/10/2018 99  96 - 112 mEq/L Final  . CO2 06/10/2018 30  19 - 32 mEq/L Final  . Glucose, Bld 06/10/2018 105* 70 - 99 mg/dL Final  . BUN 06/10/2018 13  6 - 23 mg/dL Final  . Creatinine, Ser 06/10/2018 0.73  0.40 - 1.20 mg/dL Final  . Total Bilirubin 06/10/2018 0.4  0.2 - 1.2 mg/dL Final  . Alkaline Phosphatase 06/10/2018 57  39 - 117 U/L Final  . AST 06/10/2018 16  0 - 37 U/L Final  . ALT 06/10/2018 17  0 - 35 U/L Final  . Total Protein 06/10/2018 7.4  6.0 -  8.3 g/dL Final  . Albumin 06/10/2018 4.3  3.5 - 5.2 g/dL Final  . Calcium 06/10/2018 9.5  8.4 - 10.5 mg/dL Final  . GFR 06/10/2018 91.06  >60.00 mL/min Final      EXAM:  BP 122/78 (BP Location: Left Arm, Patient Position: Sitting, Cuff Size: Normal)   Pulse 86   Ht 5\' 3"  (1.6 m)   Wt 247 lb 9.6 oz (112.3 kg)   SpO2 98%   BMI 43.86 kg/m    She has a 2-2.5 times enlarged smooth rubbery diffuse goiter especially on the right side Acanthosis of the neck present  Assessment/Plan:   OBESITY:  Although she is currently not taking Belviq she has not gained much weight back only 3 pounds Emphasized that left changes should be made She will check to see if her insurance will cover nutritional counseling She will also join [employer] so she can exercise on her way back instead of randomly walking only   Hypothyroidism: Adequately controlled previously and will need follow-up on the next visit Stable goiter   HYPERTENSION: This is well-controlled on Zestoretic  HYPERLIPIDEMIA: LDL is back to goal with increasing Crestor and she will continue this  Impaired fasting glucose: Her blood sugar is 105, improved from 110 although A1c is usually below prediabetic range She will try to take metformin twice a day now to see if she can tolerate this   There are no Patient Instructions on file for this visit.    Elayne Snare 06/15/2018, 8:01 AM

## 2018-08-30 ENCOUNTER — Other Ambulatory Visit: Payer: Self-pay | Admitting: Endocrinology

## 2018-10-18 ENCOUNTER — Other Ambulatory Visit (INDEPENDENT_AMBULATORY_CARE_PROVIDER_SITE_OTHER): Payer: 59

## 2018-10-18 DIAGNOSIS — R7303 Prediabetes: Secondary | ICD-10-CM

## 2018-10-18 DIAGNOSIS — E063 Autoimmune thyroiditis: Secondary | ICD-10-CM

## 2018-10-18 DIAGNOSIS — E782 Mixed hyperlipidemia: Secondary | ICD-10-CM

## 2018-10-18 DIAGNOSIS — E282 Polycystic ovarian syndrome: Secondary | ICD-10-CM | POA: Diagnosis not present

## 2018-10-18 LAB — LIPID PANEL
CHOL/HDL RATIO: 5
Cholesterol: 183 mg/dL (ref 0–200)
HDL: 38.8 mg/dL — ABNORMAL LOW (ref >39.00)
NONHDL: 143.9
Triglycerides: 230 mg/dL — ABNORMAL HIGH (ref 0.0–149.0)
VLDL: 46 mg/dL — AB (ref 0.0–40.0)

## 2018-10-18 LAB — COMPREHENSIVE METABOLIC PANEL
ALT: 17 U/L (ref 0–35)
AST: 15 U/L (ref 0–37)
Albumin: 4.4 g/dL (ref 3.5–5.2)
Alkaline Phosphatase: 53 U/L (ref 39–117)
BUN: 13 mg/dL (ref 6–23)
CALCIUM: 9.9 mg/dL (ref 8.4–10.5)
CHLORIDE: 100 meq/L (ref 96–112)
CO2: 26 meq/L (ref 19–32)
CREATININE: 0.73 mg/dL (ref 0.40–1.20)
GFR: 90.92 mL/min (ref >60.00)
Glucose, Bld: 104 mg/dL — ABNORMAL HIGH (ref 70–99)
Potassium: 3.7 mEq/L (ref 3.5–5.1)
Sodium: 136 mEq/L (ref 135–145)
Total Bilirubin: 0.6 mg/dL (ref 0.2–1.2)
Total Protein: 7.6 g/dL (ref 6.0–8.3)

## 2018-10-18 LAB — LDL CHOLESTEROL, DIRECT: Direct LDL: 122 mg/dL

## 2018-10-18 LAB — HEMOGLOBIN A1C: Hgb A1c MFr Bld: 5.3 % (ref 4.6–6.5)

## 2018-10-18 LAB — TSH: TSH: 2.11 u[IU]/mL (ref 0.35–4.50)

## 2018-10-20 LAB — TESTOSTERONE, FREE, TOTAL, SHBG
SEX HORMONE BINDING: 52 nmol/L (ref 24.6–122.0)
TESTOSTERONE FREE: 3.3 pg/mL (ref 0.0–4.2)
Testosterone: 32 ng/dL (ref 8–48)

## 2018-10-22 ENCOUNTER — Ambulatory Visit: Payer: 59 | Admitting: Endocrinology

## 2018-10-22 ENCOUNTER — Encounter: Payer: Self-pay | Admitting: Endocrinology

## 2018-10-22 VITALS — BP 118/72 | HR 86 | Ht 64.0 in | Wt 252.0 lb

## 2018-10-22 DIAGNOSIS — E782 Mixed hyperlipidemia: Secondary | ICD-10-CM

## 2018-10-22 DIAGNOSIS — R7303 Prediabetes: Secondary | ICD-10-CM

## 2018-10-22 DIAGNOSIS — E282 Polycystic ovarian syndrome: Secondary | ICD-10-CM

## 2018-10-22 DIAGNOSIS — E063 Autoimmune thyroiditis: Secondary | ICD-10-CM

## 2018-10-22 NOTE — Patient Instructions (Signed)
Saxenda

## 2018-10-22 NOTE — Progress Notes (Signed)
Patient ID: Nicole Schwartz, female   DOB: 1971/03/25, 47 y.o.   MRN: [id number]    Chief complaint: Followup of various problems  History of Present Illness:  OBESITY:    In 8/16 she was started on Belviq She had previously lost over 20 pounds with baseline weight of 258 However her insurance does not cover this or any other weight loss medications  she has not taken this Did not tolerate low-dose phentermine  Her weight continues to go back up However has not been able to any walking recently because of her back pain, has been recommended working out at the gym previously  Her insurance may not be covering counseling for obesity but she does have impaired fasting glucose and PCOS However not exercising    Wt Readings from Last 3 Encounters:  10/22/18 252 lb (114.3 kg)  06/15/18 247 lb 9.6 oz (112.3 kg)  01/21/18 244 lb 9.6 oz (110.9 kg)    HYPERTENSION: She has had hypertension since at least 2004. Has been on ACE inhibitor since about 2005 Because of swelling of her legs her ACE inhibitor was changed to low-dose Lotensin HCTZ and subsequently to Zestoretic10/12.5.  Blood pressure consistently well-controlled  BP Readings from Last 3 Encounters:  10/22/18 118/72  06/15/18 122/78  01/21/18 118/78      IMPAIRED FASTING GLUCOSE: Her glucose is now 104 fasting  She does try to watch her diet although not consistently watching saturated fats She has been  on metformin ER 750 mg, taking 1 tablet twice daily   A1c has been quite normal below the pre-diabetic range consistently   Lab Results  Component Value Date   HGBA1C 5.3 10/18/2018   HGBA1C 5.3 01/18/2018   HGBA1C 5.2 05/14/2017   Lab Results  Component Value Date   LDLCALC 98 06/10/2018   CREATININE 0.73 10/18/2018    HYPOTHYROIDISM:  She has had a goiter since at least 1998. Baseline TSH was 5.4 when she was started on levothyroxine in 2000 and has required small doses of levothyroxine Has not  required a change in her 75 g dose for the last few years TSH last normal   Lab Results  Component Value Date   TSH 2.11 10/18/2018   TSH 1.89 01/18/2018   TSH 1.54 05/14/2017   FREET4 1.07 01/31/2016   FREET4 0.81 03/07/2014   FREET4 0.84 09/09/2013     HYPERLIPIDEMIA: She has had diabetic dyslipidemia with HDL as low as 23 and high triglycerides. Previously on Crestor and fenofibrate combination  Since LDL was relatively higher she was started back on Crestor and this was increased in 2/19 since LDL was 117  Although the lipids are improved they are worse again, possibly with her weight gain   Lab Results  Component Value Date   CHOL 183 10/18/2018   HDL 38.80 (L) 10/18/2018   LDLCALC 98 06/10/2018   LDLDIRECT 122.0 10/18/2018   TRIG 230.0 (H) 10/18/2018   CHOLHDL 5 10/18/2018     Allergies as of 10/22/2018   No Known Allergies     Medication List        Accurate as of 10/22/18  4:11 PM. Always use your most recent med list.          diclofenac 75 MG EC tablet Commonly known as:  VOLTAREN Take 75 mg by mouth 2 (two) times daily.   hydroxychloroquine 200 MG tablet Commonly known as:  PLAQUENIL TAKE 1 TABLET BY MOUTH TWICE A DAY WITH FOOD  OR MILK   lisinopril-hydrochlorothiazide 10-12.5 MG tablet Commonly known as:  PRINZIDE,ZESTORETIC TAKE 1 TABLET BY MOUTH DAILY.   metFORMIN 750 MG 24 hr tablet Commonly known as:  GLUCOPHAGE-XR Take 2 tablets daily   rosuvastatin 10 MG tablet Commonly known as:  CRESTOR Take 1 tablet (10 mg total) by mouth daily.   SYNTHROID 75 MCG tablet Generic drug:  levothyroxine TAKE 1 TABLET BY MOUTH DAILY BEFORE BREAKFAST       Allergies: No Known Allergies  No past medical history on file.  No past surgical history on file.  Family History  Problem Relation Age of Onset  . Thyroid disease Mother   . Diabetes Maternal Grandfather   . Hypertension Maternal Grandfather   . Diabetes Maternal Grandmother   .  Breast cancer Paternal Grandmother     Social History:  reports that she has never smoked. She has never used smokeless tobacco. Her alcohol and drug histories are not on file.  Review of Systems   She has been told to have rheumatoid arthritis, On Plaquenil   History of PCOS: Her menstrual cycles are regular Free testosterone normal  Labs:  Lab on 10/18/2018  Component Date Value Ref Range Status  . Testosterone 10/18/2018 32  8 - 48 ng/dL Final  . Testosterone, Free 10/18/2018 3.3  0.0 - 4.2 pg/mL Final  . Sex Hormone Binding 10/18/2018 52.0  24.6 - 122.0 nmol/L Final  . TSH 10/18/2018 2.11  0.35 - 4.50 uIU/mL Final  . Cholesterol 10/18/2018 183  0 - 200 mg/dL Final   ATP III Classification       Desirable:  < 200 mg/dL               Borderline High:  200 - 239 mg/dL          High:  > = 240 mg/dL  . Triglycerides 10/18/2018 230.0* 0.0 - 149.0 mg/dL Final   Normal:  <150 mg/dLBorderline High:  150 - 199 mg/dL  . HDL 10/18/2018 38.80* >39.00 mg/dL Final  . VLDL 10/18/2018 46.0* 0.0 - 40.0 mg/dL Final  . Total CHOL/HDL Ratio 10/18/2018 5   Final                  Men          Women1/2 Average Risk     3.4          3.3Average Risk          5.0          4.42X Average Risk          9.6          7.13X Average Risk          15.0          11.0                      . NonHDL 10/18/2018 143.90   Final   NOTE:  Non-HDL goal should be 30 mg/dL higher than patient's LDL goal (i.e. LDL goal of < 70 mg/dL, would have non-HDL goal of < 100 mg/dL)  . Sodium 10/18/2018 136  135 - 145 mEq/L Final  . Potassium 10/18/2018 3.7  3.5 - 5.1 mEq/L Final  . Chloride 10/18/2018 100  96 - 112 mEq/L Final  . CO2 10/18/2018 26  19 - 32 mEq/L Final  . Glucose, Bld 10/18/2018 104* 70 - 99 mg/dL Final  . BUN 10/18/2018 13  6 - 23  mg/dL Final  . Creatinine, Ser 10/18/2018 0.73  0.40 - 1.20 mg/dL Final  . Total Bilirubin 10/18/2018 0.6  0.2 - 1.2 mg/dL Final  . Alkaline Phosphatase 10/18/2018 53  39 - 117 U/L Final   . AST 10/18/2018 15  0 - 37 U/L Final  . ALT 10/18/2018 17  0 - 35 U/L Final  . Total Protein 10/18/2018 7.6  6.0 - 8.3 g/dL Final  . Albumin 10/18/2018 4.4  3.5 - 5.2 g/dL Final  . Calcium 10/18/2018 9.9  8.4 - 10.5 mg/dL Final  . GFR 10/18/2018 90.92  >60.00 mL/min Final  . Hgb A1c MFr Bld 10/18/2018 5.3  4.6 - 6.5 % Final   Glycemic Control Guidelines for People with Diabetes:Non Diabetic:  <6%Goal of Therapy: <7%Additional Action Suggested:  >8%   . Direct LDL 10/18/2018 122.0  mg/dL Final   Optimal:  <100 mg/dLNear or Above Optimal:  100-129 mg/dLBorderline High:  130-159 mg/dLHigh:  160-189 mg/dLVery High:  >190 mg/dL      EXAM:  BP 118/72 (Cuff Size: Large)   Pulse 86   Ht 5\' 4"  (1.626 m)   Wt 252 lb (114.3 kg)   SpO2 98%   BMI 43.26 kg/m    She has a 2 times enlarged smooth rubbery diffuse goiter larger on the right side Acanthosis of the neck present  Assessment/Plan:   OBESITY:  She is gaining weight back again This may be partly related to recent lack of exercise but also looks like she can do better on her diet  She will check to see if her insurance will cover [place] Also will try to get her nutritional counseling prediabetes diagnosis  Needs to restart exercise when able to  Hypothyroidism: Adequately controlled with a persistent small goiter  HYPERTENSION: This is well-controlled on Zestoretic  HYPERLIPIDEMIA: LDL is back above 100 and also triglycerides are high This may be related to her weight gain and inconsistent diet Given patient information handout on low saturated fat foods May consider increasing Crestor again if LDL continues to be high  Impaired fasting glucose: Her blood sugar is 104 She is to continue metformin twice a day but may need to stop this if able to get Saxenda   Patient Instructions  Saxenda    Total visit time for evaluation and management of multiple problems and counseling =25 minutes   Nicole Schwartz 10/22/2018,  4:11 PM

## 2018-10-23 ENCOUNTER — Other Ambulatory Visit: Payer: Self-pay | Admitting: Endocrinology

## 2018-10-23 MED ORDER — LIRAGLUTIDE -WEIGHT MANAGEMENT 18 MG/3ML ~~LOC~~ SOPN: 3.0000 mg | PEN_INJECTOR | Freq: Every day | SUBCUTANEOUS | 3 refills | Status: DC

## 2018-10-25 ENCOUNTER — Other Ambulatory Visit: Payer: Self-pay

## 2018-10-25 MED ORDER — LIRAGLUTIDE -WEIGHT MANAGEMENT 18 MG/3ML ~~LOC~~ SOPN: 3.0000 mg | PEN_INJECTOR | Freq: Every day | SUBCUTANEOUS | 3 refills | Status: DC

## 2018-10-25 NOTE — Telephone Encounter (Signed)
Medication refilled

## 2018-10-25 NOTE — Telephone Encounter (Signed)
Forwarding

## 2018-11-10 ENCOUNTER — Other Ambulatory Visit: Payer: Self-pay | Admitting: Endocrinology

## 2018-12-01 DIAGNOSIS — Z923 Personal history of irradiation: Secondary | ICD-10-CM

## 2018-12-01 HISTORY — DX: Personal history of irradiation: Z92.3

## 2019-01-30 DIAGNOSIS — C50919 Malignant neoplasm of unspecified site of unspecified female breast: Secondary | ICD-10-CM

## 2019-01-30 HISTORY — DX: Malignant neoplasm of unspecified site of unspecified female breast: C50.919

## 2019-02-15 ENCOUNTER — Other Ambulatory Visit (INDEPENDENT_AMBULATORY_CARE_PROVIDER_SITE_OTHER): Payer: 59

## 2019-02-15 ENCOUNTER — Other Ambulatory Visit: Payer: Self-pay

## 2019-02-15 DIAGNOSIS — R7303 Prediabetes: Secondary | ICD-10-CM | POA: Diagnosis not present

## 2019-02-15 DIAGNOSIS — E782 Mixed hyperlipidemia: Secondary | ICD-10-CM

## 2019-02-15 DIAGNOSIS — E063 Autoimmune thyroiditis: Secondary | ICD-10-CM

## 2019-02-15 LAB — COMPREHENSIVE METABOLIC PANEL
ALBUMIN: 4.1 g/dL (ref 3.5–5.2)
ALK PHOS: 55 U/L (ref 39–117)
ALT: 16 U/L (ref 0–35)
AST: 14 U/L (ref 0–37)
BUN: 14 mg/dL (ref 6–23)
CO2: 28 mEq/L (ref 19–32)
Calcium: 9.7 mg/dL (ref 8.4–10.5)
Chloride: 103 mEq/L (ref 96–112)
Creatinine, Ser: 0.68 mg/dL (ref 0.40–1.20)
GFR: 92.71 mL/min (ref >60.00)
Glucose, Bld: 108 mg/dL — ABNORMAL HIGH (ref 70–99)
Potassium: 3.7 mEq/L (ref 3.5–5.1)
Sodium: 139 mEq/L (ref 135–145)
TOTAL PROTEIN: 7 g/dL (ref 6.0–8.3)
Total Bilirubin: 0.4 mg/dL (ref 0.2–1.2)

## 2019-02-15 LAB — LDL CHOLESTEROL, DIRECT: LDL DIRECT: 113 mg/dL

## 2019-02-15 LAB — LIPID PANEL
Cholesterol: 189 mg/dL (ref 0–200)
HDL: 40.6 mg/dL (ref >39.00)
NonHDL: 148.14
Total CHOL/HDL Ratio: 5
Triglycerides: 217 mg/dL — ABNORMAL HIGH (ref 0.0–149.0)
VLDL: 43.4 mg/dL — ABNORMAL HIGH (ref 0.0–40.0)

## 2019-02-15 LAB — TSH: TSH: 1.77 u[IU]/mL (ref 0.35–4.50)

## 2019-02-17 ENCOUNTER — Other Ambulatory Visit: Payer: Self-pay

## 2019-02-18 ENCOUNTER — Other Ambulatory Visit: Payer: Self-pay

## 2019-02-18 ENCOUNTER — Ambulatory Visit: Payer: 59 | Admitting: Endocrinology

## 2019-02-18 ENCOUNTER — Encounter: Payer: Self-pay | Admitting: Endocrinology

## 2019-02-18 DIAGNOSIS — E782 Mixed hyperlipidemia: Secondary | ICD-10-CM

## 2019-02-18 MED ORDER — NALTREXONE-BUPROPION HCL ER 8-90 MG PO TB12: ORAL_TABLET | ORAL | 2 refills | Status: DC

## 2019-02-18 NOTE — Patient Instructions (Addendum)
Contrave: go up every 1-2 weeks max dose 2 twice daily with food  Exercise daily

## 2019-02-18 NOTE — Progress Notes (Signed)
Patient ID: Nicole Schwartz, female   DOB: 1971-10-13, 48 y.o.   MRN: [id number]    Chief complaint: Followup of various problems  History of Present Illness:  OBESITY:    In 8/16 she was started on Belviq and with this she had previously lost over 20 pounds with baseline weight of 258 However her insurance does not cover this or any other weight loss medications including Saxenda Did not tolerate low-dose phentermine  Her weight continues to go back up progressively She is not planning her meals, eating some junk food at work and weight is up another 5 pounds  Her insurance may not be covering counseling for obesity but she does have impaired fasting glucose and PCOS Currently exercising 2-3/7 days   Wt Readings from Last 3 Encounters:  02/18/19 257 lb 3.2 oz (116.7 kg)  10/22/18 252 lb (114.3 kg)  06/15/18 247 lb 9.6 oz (112.3 kg)    HYPERTENSION: She has had hypertension since at least 2004. Has been on ACE inhibitor since about 2005 Because of swelling of her legs her ACE inhibitor was changed to low-dose Lotensin HCTZ and subsequently to hold lisinopril HCT 10/12.5  Blood pressure consistently well-controlled  BP Readings from Last 3 Encounters:  02/18/19 122/76  10/22/18 118/72  06/15/18 122/78      IMPAIRED FASTING GLUCOSE: Her glucose is now 108 fasting and has been as high as 113  She does try to watch her diet although not consistently watching saturated fats She has been  on metformin ER 750 mg, taking 1 tablet twice daily   A1c has been quite normal below the pre-diabetic range consistently   Lab Results  Component Value Date   HGBA1C 5.3 10/18/2018   HGBA1C 5.3 01/18/2018   HGBA1C 5.2 05/14/2017   Lab Results  Component Value Date   LDLCALC 98 06/10/2018   CREATININE 0.68 02/15/2019    HYPOTHYROIDISM:  She has had a goiter since at least 1998. Baseline TSH was 5.4 when she was started on levothyroxine in 2000 and has required small  doses of levothyroxine Has not required a change in her 75 g dose for the last few years TSH again normal   Lab Results  Component Value Date   TSH 1.77 02/15/2019   TSH 2.11 10/18/2018   TSH 1.89 01/18/2018   FREET4 1.07 01/31/2016   FREET4 0.81 03/07/2014   FREET4 0.84 09/09/2013     HYPERLIPIDEMIA: She has had diabetic dyslipidemia with HDL as low as 23 and high triglycerides. Previously on Crestor and fenofibrate combination  Since LDL was relatively higher she was started back on Crestor and this was increased in 2/19 up to 10 mg since LDL was 117  Although the lipids are improved they are not at target with poor diet and weight gain  Lab Results  Component Value Date   CHOL 189 02/15/2019   CHOL 183 10/18/2018   CHOL 179 06/10/2018   Lab Results  Component Value Date   HDL 40.60 02/15/2019   HDL 38.80 (L) 10/18/2018   HDL 42.40 06/10/2018   Lab Results  Component Value Date   LDLCALC 98 06/10/2018   LDLCALC 105 (H) 05/14/2017   LDLCALC 92 10/06/2016   Lab Results  Component Value Date   TRIG 217.0 (H) 02/15/2019   TRIG 230.0 (H) 10/18/2018   TRIG 195.0 (H) 06/10/2018   Lab Results  Component Value Date   CHOLHDL 5 02/15/2019   CHOLHDL 5 10/18/2018   CHOLHDL  4 06/10/2018   Lab Results  Component Value Date   LDLDIRECT 113.0 02/15/2019   LDLDIRECT 122.0 10/18/2018   LDLDIRECT 119.0 01/18/2018      Allergies as of 02/18/2019   No Known Allergies     Medication List       Accurate as of February 18, 2019  2:57 PM. Always use your most recent med list.        diclofenac 75 MG EC tablet Commonly known as:  VOLTAREN Take 75 mg by mouth 2 (two) times daily.   hydroxychloroquine 200 MG tablet Commonly known as:  PLAQUENIL TAKE 1 TABLET BY MOUTH TWICE A DAY WITH FOOD OR MILK   lisinopril-hydrochlorothiazide 10-12.5 MG tablet Commonly known as:  PRINZIDE,ZESTORETIC TAKE 1 TABLET BY MOUTH DAILY.   metFORMIN 750 MG 24 hr tablet Commonly known  as:  GLUCOPHAGE-XR Take 2 tablets daily   rosuvastatin 10 MG tablet Commonly known as:  Crestor Take 1 tablet (10 mg total) by mouth daily.   Synthroid 75 MCG tablet Generic drug:  levothyroxine TAKE 1 TABLET BY MOUTH DAILY BEFORE BREAKFAST       Allergies: No Known Allergies  History reviewed. No pertinent past medical history.  History reviewed. No pertinent surgical history.  Family History  Problem Relation Age of Onset   Thyroid disease Mother    Diabetes Maternal Grandfather    Hypertension Maternal Grandfather    Diabetes Maternal Grandmother    Breast cancer Paternal Grandmother     Social History:  reports that she has never smoked. She has never used smokeless tobacco. No history on file for alcohol and drug.  Review of Systems   She has been told to have rheumatoid arthritis, On Plaquenil   History of PCOS: Her menstrual cycles are regular Free testosterone last normal  Labs:  Lab on 02/15/2019  Component Date Value Ref Range Status   TSH 02/15/2019 1.77  0.35 - 4.50 uIU/mL Final   Cholesterol 02/15/2019 189  0 - 200 mg/dL Final   ATP III Classification       Desirable:  < 200 mg/dL               Borderline High:  200 - 239 mg/dL          High:  > = 240 mg/dL   Triglycerides 02/15/2019 217.0* 0.0 - 149.0 mg/dL Final   Normal:  <150 mg/dLBorderline High:  150 - 199 mg/dL   HDL 02/15/2019 40.60  >39.00 mg/dL Final   VLDL 02/15/2019 43.4* 0.0 - 40.0 mg/dL Final   Total CHOL/HDL Ratio 02/15/2019 5   Final                  Men          Women1/2 Average Risk     3.4          3.3Average Risk          5.0          4.42X Average Risk          9.6          7.13X Average Risk          15.0          11.0                       NonHDL 02/15/2019 148.14   Final   NOTE:  Non-HDL goal should be 30 mg/dL higher than patient's LDL  goal (i.e. LDL goal of < 70 mg/dL, would have non-HDL goal of < 100 mg/dL)   Sodium 02/15/2019 139  135 - 145 mEq/L Final    Potassium 02/15/2019 3.7  3.5 - 5.1 mEq/L Final   Chloride 02/15/2019 103  96 - 112 mEq/L Final   CO2 02/15/2019 28  19 - 32 mEq/L Final   Glucose, Bld 02/15/2019 108* 70 - 99 mg/dL Final   BUN 02/15/2019 14  6 - 23 mg/dL Final   Creatinine, Ser 02/15/2019 0.68  0.40 - 1.20 mg/dL Final   Total Bilirubin 02/15/2019 0.4  0.2 - 1.2 mg/dL Final   Alkaline Phosphatase 02/15/2019 55  39 - 117 U/L Final   AST 02/15/2019 14  0 - 37 U/L Final   ALT 02/15/2019 16  0 - 35 U/L Final   Total Protein 02/15/2019 7.0  6.0 - 8.3 g/dL Final   Albumin 02/15/2019 4.1  3.5 - 5.2 g/dL Final   Calcium 02/15/2019 9.7  8.4 - 10.5 mg/dL Final   GFR 02/15/2019 92.71  >60.00 mL/min Final   Direct LDL 02/15/2019 113.0  mg/dL Final   Optimal:  <100 mg/dLNear or Above Optimal:  100-129 mg/dLBorderline High:  130-159 mg/dLHigh:  160-189 mg/dLVery High:  >190 mg/dL      EXAM:  BP 122/76 (BP Location: Left Arm, Patient Position: Sitting, Cuff Size: Normal)    Pulse 93    Temp 98.7 F (37.1 C) (Oral)    Ht 5\' 4"  (1.626 m)    Wt 257 lb 3.2 oz (116.7 kg)    SpO2 97%    BMI 44.15 kg/m    Generalized obesity present No edema  Assessment/Plan:   OBESITY:  She is gaining weight again She can do better with diet and exercise and has not been motivated but may do better with a weight loss medication She will be given Contrave and discussed how this works, dosage titration and possible side effects, co-pay card and information given  Needs to increase exercise at least 3 to 4 days a week  Hypothyroidism: Adequately controlled with 75 mcg  HYPERTENSION: This is well-controlled on Zestoretic  HYPERLIPIDEMIA: Not at target but may improve with better diet and weight loss  Impaired fasting glucose: Her blood sugar is 108 She is to continue metformin twice a day for now   There are no Patient Instructions on file for this visit.     Elayne Snare 02/18/2019, 2:57 PM

## 2019-02-21 ENCOUNTER — Other Ambulatory Visit: Payer: Self-pay | Admitting: Obstetrics and Gynecology

## 2019-02-21 DIAGNOSIS — R928 Other abnormal and inconclusive findings on diagnostic imaging of breast: Secondary | ICD-10-CM

## 2019-02-22 ENCOUNTER — Ambulatory Visit
Admission: RE | Admit: 2019-02-22 | Discharge: 2019-02-22 | Disposition: A | Discharge status: Home / Self Care | Payer: 59 | Source: Ambulatory Visit | Attending: Obstetrics and Gynecology | Admitting: Obstetrics and Gynecology

## 2019-02-22 ENCOUNTER — Other Ambulatory Visit: Payer: Self-pay

## 2019-02-22 ENCOUNTER — Other Ambulatory Visit: Payer: Self-pay | Admitting: Obstetrics and Gynecology

## 2019-02-22 DIAGNOSIS — R928 Other abnormal and inconclusive findings on diagnostic imaging of breast: Secondary | ICD-10-CM

## 2019-02-22 DIAGNOSIS — R921 Mammographic calcification found on diagnostic imaging of breast: Secondary | ICD-10-CM

## 2019-02-24 ENCOUNTER — Ambulatory Visit
Admission: RE | Admit: 2019-02-24 | Discharge: 2019-02-24 | Disposition: A | Discharge status: Home / Self Care | Payer: 59 | Source: Ambulatory Visit | Attending: Obstetrics and Gynecology | Admitting: Obstetrics and Gynecology

## 2019-02-24 ENCOUNTER — Other Ambulatory Visit: Payer: Self-pay

## 2019-02-24 ENCOUNTER — Other Ambulatory Visit: Payer: Self-pay | Admitting: Obstetrics and Gynecology

## 2019-02-24 DIAGNOSIS — R921 Mammographic calcification found on diagnostic imaging of breast: Secondary | ICD-10-CM

## 2019-03-01 DIAGNOSIS — Z170 Estrogen receptor positive status [ER+]: Secondary | ICD-10-CM

## 2019-03-01 DIAGNOSIS — C50411 Malignant neoplasm of upper-outer quadrant of right female breast: Secondary | ICD-10-CM | POA: Insufficient documentation

## 2019-03-03 ENCOUNTER — Ambulatory Visit: Payer: Self-pay | Admitting: Surgery

## 2019-03-03 ENCOUNTER — Other Ambulatory Visit: Payer: Self-pay | Admitting: Surgery

## 2019-03-03 DIAGNOSIS — D0511 Intraductal carcinoma in situ of right breast: Secondary | ICD-10-CM

## 2019-03-03 NOTE — H&P (Signed)
[place] Documented: 03/03/2019 9:26 AM Location: [facility] Patient #: [id number] DOB: [birth year] Married / Language: Cleophus Molt / Race: White Female  History of Present Illness Marcello Moores A. Mackenzye Mackel MD; 03/03/2019 10:06 AM) Patient words: Patient sent at the request of Dr. Philis Pique for screening detected mammographic abnormality of her right breast. Calcifications are noted in the right breast upper outer quadrant. 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 Area measured 2.5 cm mammography. She has no complaints of pain, nipple discharge or breast mass.      ADDITIONAL INFORMATION: Immunostain for E-cadherin is positive, consistent with a ductal phenotype. [name] MD Pathologist, Electronic Signature ( Signed 03/01/2019) PROGNOSTIC INDICATORS Results: IMMUNOHISTOCHEMICAL AND MORPHOMETRIC ANALYSIS PERFORMED MANUALLY Estrogen Receptor: 95%, POSITIVE, STRONG STAINING INTENSITY Progesterone Receptor: 95%, POSITIVE, STRONG STAINING INTENSITY REFERENCE RANGE ESTROGEN RECEPTOR NEGATIVE 0% POSITIVE =>1% REFERENCE RANGE PROGESTERONE RECEPTOR NEGATIVE 0% POSITIVE =>1% All controls stained appropriately Vicente Males MD Pathologist, Electronic Signature ( Signed 02/28/2019) FINAL DIAGNOSIS 1 of 2 FINAL for MONEA, PESANTEZ ([id number]) Diagnosis Breast, right, needle core biopsy, upper outer quadrant at middle depth - MAMMARY CARCINOMA IN SITU, INTERMEDIATE NUCLEAR GRADE WITH CALCIFICATIONS. SEE NOTE. Diagnosis Note Mammary carcinoma in situ measures 5.5 mm in greatest dimension. Immunohistochemical stain for E-cadherin is pending for characterization of the tumor phenotype. Dr Jeannie Done has reviewed this case and concurs with the above interpretation. A breast prognostic profile (ER and PR) is pending and will be reported in an addendum. The [facility] was notified on 02/25/2019. (NK:ecj 02/25/2019) [name] MD Pathologist, Electronic  Signature (Case signed 02/25/2019) Specimen Gross and Clinical Information Specimen Comment Extracted < 5 minutes; screening detected suspicious approximate 2.5cm group of calcifications involving the upper outer quadrant of the right breast at middle depth Specimen(s) Obtained: Breast, right, needle core biopsy, upper outer quadrant at middle depth Specimen Clinical Information ADH/DCIS vs lobular neoplasia and less likely [facility] or fibroadenomatoid changes          Screening recall for right breast calcifications.  EXAM: DIGITAL DIAGNOSTIC RIGHT MAMMOGRAM WITH CAD  COMPARISON: Previous exam(s).  ACR Breast Density Category b: There are scattered areas of fibroglandular density.  FINDINGS: In the lateral right breast, middle to posterior depth there is a 2.5 cm group of punctate calcifications.  Mammographic images were processed with CAD.  IMPRESSION: Indeterminate 2.5 cm group of punctate calcifications in the lateral right breast.  RECOMMENDATION: Stereotactic biopsy is recommended for the right breast calcifications. The procedure has been scheduled for 02/24/2019 at 8:30 a.m.  I have discussed the findings and recommendations with the patient. Results were also provided in writing at the conclusion of the visit. If applicable, a reminder letter will be sent to the patient regarding the next appointment.  BI-RADS CATEGORY 4: Suspicious.   Electronically Signed By: Ammie Ferrier M.D. On: 02/22/2019 14:20.  The patient is a 48 year old female.   Past Surgical History Nance Pew, CMA; 03/03/2019 9:26 AM) Breast Biopsy Right. Cesarean Section - Multiple  Diagnostic Studies History Nance Pew, CMA; 03/03/2019 9:26 AM) Colonoscopy never Mammogram within last year Pap Smear 1-5 years ago  Allergies Nance Pew, CMA; 03/03/2019 9:26 AM) No Known Allergies [03/03/2019]: No Known Drug Allergies [03/03/2019]: Allergies  Reconciled  Medication History Nance Pew, CMA; 03/03/2019 9:28 AM) Synthroid (50MCG Tablet, Oral) Active. Diclofenac Potassium (50MG  Tablet, Oral) Active. Lisinopril (2.5MG  Tablet, Oral) Active. metFORMIN HCl ER (750MG  Tablet ER 24HR, Oral) Active. Medications Reconciled  Social History Nance Pew, [place]; 03/03/2019  9:26 AM) Caffeine use Carbonated beverages, Tea. No alcohol use No drug use Tobacco use Never smoker.  Family History Nance Pew, [place]; 03/03/2019 9:26 AM) Hypertension Mother. Thyroid problems Brother, Mother, Son.  Pregnancy / Birth History Nance Pew, [place]; 03/03/2019 9:26 AM) Age at menarche 55 years. Gravida 2 Length (months) of breastfeeding 3-6 Maternal age 1-20 Para 2 Regular periods  Other Problems Nance Pew, CMA; 03/03/2019 9:26 AM) Arthritis High blood pressure Thyroid Disease     Review of Systems (Sabrina Canty CMA; 03/03/2019 9:26 AM) General Not Present- Appetite Loss, Chills, Fatigue, Fever, Night Sweats, Weight Gain and Weight Loss. Skin Not Present- Change in Wart/Mole, Dryness, Hives, Jaundice, New Lesions, Non-Healing Wounds, Rash and Ulcer. HEENT Not Present- Earache, Hearing Loss, Hoarseness, Nose Bleed, Oral Ulcers, Ringing in the Ears, Seasonal Allergies, Sinus Pain, Sore Throat, Visual Disturbances, Wears glasses/contact lenses and Yellow Eyes. Respiratory Not Present- Bloody sputum, Chronic Cough, Difficulty Breathing, Snoring and Wheezing. Breast Not Present- Breast Mass, Breast Pain, Nipple Discharge and Skin Changes. Cardiovascular Not Present- Chest Pain, Difficulty Breathing Lying Down, Leg Cramps, Palpitations, Rapid Heart Rate, Shortness of Breath and Swelling of Extremities. Gastrointestinal Not Present- Abdominal Pain, Bloating, Bloody Stool, Change in Bowel Habits, Chronic diarrhea, Constipation, Difficulty Swallowing, Excessive gas, Gets full quickly at meals, Hemorrhoids, Indigestion, Nausea,  Rectal Pain and Vomiting. Female Genitourinary Not Present- Frequency, Nocturia, Painful Urination, Pelvic Pain and Urgency. Musculoskeletal Not Present- Back Pain, Joint Pain, Joint Stiffness, Muscle Pain, Muscle Weakness and Swelling of Extremities. Neurological Not Present- Decreased Memory, Fainting, Headaches, Numbness, Seizures, Tingling, Tremor, Trouble walking and Weakness. Psychiatric Not Present- Anxiety, Bipolar, Change in Sleep Pattern, Depression, Fearful and Frequent crying. Endocrine Not Present- Cold Intolerance, Excessive Hunger, Hair Changes, Heat Intolerance, Hot flashes and New Diabetes. Hematology Not Present- Blood Thinners, Easy Bruising, Excessive bleeding, Gland problems, HIV and Persistent Infections.  Vitals (Sabrina Canty CMA; 03/03/2019 9:28 AM) 03/03/2019 9:28 AM Weight: 249 lb Height: 61in Body Surface Area: 2.07 m Body Mass Index: 47.05 kg/m  Temp.: 98.54F(Oral)  Pulse: 111 (Regular)  P.OX: 97% (Room air) BP: 162/90 (Sitting, Left Arm, Standard)      Physical Exam (Mateo Overbeck A. Countess Biebel MD; 03/03/2019 10:07 AM)  General Mental Status-Alert. General Appearance-Consistent with stated age. Hydration-Well hydrated. Voice-Normal.  Head and Neck Head-normocephalic, atraumatic with no lesions or palpable masses. Trachea-midline. Thyroid Gland Characteristics - normal size and consistency.  Chest and Lung Exam Chest and lung exam reveals -quiet, even and easy respiratory effort with no use of accessory muscles and on auscultation, normal breath sounds, no adventitious sounds and normal vocal resonance. Inspection Chest Wall - Normal. Back - normal.  Breast Breast - Left-Symmetric, Non Tender, No Biopsy scars, no Dimpling, No Inflammation, No Lumpectomy scars, No Mastectomy scars, No Peau d' Orange. Breast - Right-Symmetric, Non Tender, No Biopsy scars, no Dimpling, No Inflammation, No Lumpectomy scars, No Mastectomy scars, No Peau  d' Orange. Breast Lump-No Palpable Breast Mass.  Cardiovascular Cardiovascular examination reveals -normal heart sounds, regular rate and rhythm with no murmurs and normal pedal pulses bilaterally.  Neurologic Neurologic evaluation reveals -alert and oriented x 3 with no impairment of recent or remote memory. Mental Status-Normal.  Musculoskeletal Normal Exam - Left-Upper Extremity Strength Normal and Lower Extremity Strength Normal. Normal Exam - Right-Upper Extremity Strength Normal and Lower Extremity Strength Normal.  Lymphatic Head & Neck  General Head & Neck Lymphatics: Bilateral - Description - Normal. Axillary  General Axillary Region: Bilateral - Description - Normal. Tenderness - Non Tender.  Assessment & Plan (Dsean Vantol A. Hermena Swint MD; 03/03/2019 10:06 AM)  BREAST NEOPLASM, TIS (DCIS), RIGHT (D05.11) Impression: GENETICS  Risk of lumpectomy include bleeding, infection, seroma, more surgery, use of seed/wire, wound care, cosmetic deformity and the need for other treatments, death , blood clots, death. Pt agrees to proceed. RIGHT BREAST SEED LUMPECTOMY  Discussed all treatment options as well as the need for genetics, radiation therapy, possible medical oncology and reviewed options of breast conservation versus mastectomy and reconstruction.  Current Plans Pt Education - [facility] - General recommendations Pt Education - Education: Pathology Report given to patient You are being scheduled for surgery- Our schedulers will call you.  You should hear from our office's scheduling department within 5 working days about the location, date, and time of surgery. We try to make accommodations for patient's preferences in scheduling surgery, but sometimes the OR schedule or the surgeon's schedule prevents [place] from making those accommodations.  If you have not heard from our office [phone]) in 5 working days, call the office and ask for your surgeon's nurse.  If you  have other questions about your diagnosis, plan, or surgery, call the office and ask for your surgeon's nurse.  We discussed the staging and pathophysiology of breast cancer. We discussed all of the different options for treatment for breast cancer including surgery, chemotherapy, radiation therapy, Herceptin, and antiestrogen therapy. We discussed a sentinel lymph node biopsy as she does not appear to having lymph node involvement right now. We discussed the performance of that with injection of radioactive tracer and blue dye. We discussed that she would have an incision underneath her axillary hairline. We discussed that there is a bout a 10-20% chance of having a positive node with a sentinel lymph node biopsy and we will await the permanent pathology to make any other first further decisions in terms of her treatment. One of these options might be to return to the operating room to perform an axillary lymph node dissection. We discussed about a 1-2% risk lifetime of chronic shoulder pain as well as lymphedema associated with a sentinel lymph node biopsy. We discussed the options for treatment of the breast cancer which included lumpectomy versus a mastectomy. We discussed the performance of the lumpectomy with a wire placement. We discussed a 10-20% chance of a positive margin requiring reexcision in the operating room. We also discussed that she may need radiation therapy or antiestrogen therapy or both if she undergoes lumpectomy. We discussed the mastectomy and the postoperative care for that as well. We discussed that there is no difference in her survival whether she undergoes lumpectomy with radiation therapy or antiestrogen therapy versus a mastectomy. There is a slight difference in the local recurrence rate being 3-5% with lumpectomy and about 1% with a mastectomy. We discussed the risks of operation including bleeding, infection, possible reoperation. She understands her further therapy will be  based on what her stages at the time of her operation.  Pt Education - [facility] Breast Biopsy HCI: discussed with patient and provided information.

## 2019-03-07 ENCOUNTER — Encounter: Payer: Self-pay | Admitting: *Deleted

## 2019-03-08 ENCOUNTER — Encounter: Payer: Self-pay | Admitting: Hematology and Oncology

## 2019-03-09 ENCOUNTER — Encounter: Payer: Self-pay | Admitting: Adult Health

## 2019-03-10 ENCOUNTER — Telehealth: Payer: Self-pay | Admitting: Hematology and Oncology

## 2019-03-10 NOTE — Telephone Encounter (Signed)
Pt has been rescheduled to see Dr. Lindi Adie on 4/21 at 215pm and genetics at 3pm. Pt agreed to the new appts. Per staff msg, pt needed to be seen prior to surgery.

## 2019-03-21 ENCOUNTER — Telehealth: Payer: Self-pay | Admitting: Hematology and Oncology

## 2019-03-21 NOTE — Telephone Encounter (Signed)
Left voicemail for patient regarding her Webex appointment. I told her told download the [employer] App onto one of her devices. And sent her join link to [email or web address]. Told her to click the green "join meeting" button and that would populate the appointment details. Would also prompt her to download the app if she hadn't already.   Told her if she was unable to do a video call with Dr. Lindi Adie, to call us back and we would convert it to a phone call visit.

## 2019-03-21 NOTE — Progress Notes (Signed)
HEMATOLOGY-ONCOLOGY [facility] VISIT CONSULT NOTE  I connected with Nicole Schwartz on 03/22/2019 at  2:15 PM EDT by Webex video conference and verified that I am speaking with the correct person using two identifiers.  I discussed the limitations, risks, security and privacy concerns of performing an evaluation and management service by Webex and the availability of in person appointments.  I also discussed with the patient that there may be a patient responsible charge related to this service. The patient expressed understanding and agreed to proceed.  Patient's Location: Home Physician Location: Clinic  CHIEF COMPLIANT/PURPOSE OF CONSULTATION: Newly diagnosed breast cancer  INTERVAL HISTORY: Nicole Schwartz is a 48 y.o. female with newly diagnosed DCIS of the right breast. The cancer was detected on a screening mammogram and a diagnostic mammogram on 02/22/19 showed a 2.5cm group of calcifications in the right breast. A biopsy on 02/24/19 showed intermediate grade DCIS, ER/PR 95%.   She presents today over Webex for an initial consultation and reports a family history of breast cancer in her paternal grandmother.  I reviewed her records extensively and collaborated the history with the patient.    Malignant neoplasm of upper-outer quadrant of right breast in female, estrogen receptor positive ([place])   03/01/2019 Initial Diagnosis    Diagnostic mammogram detected 2.5cm calcifications in right breast. 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     03/09/2019 Cancer Staging    Staging form: Breast, AJCC 8th Edition - Clinical stage from 03/09/2019: Stage 0 (cTis (DCIS), cN0, cM0, ER+, PR+) - Signed by Gardenia Phlegm, NP on 03/09/2019    MEDICAL HISTORY: Arthritis, hypertension, hypothyroidism  SURGICAL HISTORY: Cesarean section  SOCIAL HISTORY: Denies any tobacco alcohol or recreational drug use FAMILY HISTORY: Family History  Problem Relation Age of Onset  . Thyroid disease  Mother   . Diabetes Maternal Grandfather   . Hypertension Maternal Grandfather   . Diabetes Maternal Grandmother   . Breast cancer Paternal Grandmother    MEDICATIONS: Allergies as of 03/22/2019   No Known Allergies     Medication List       Accurate as of March 22, 2019  8:03 AM. Always use your most recent med list.        diclofenac 75 MG EC tablet Commonly known as:  VOLTAREN Take 75 mg by mouth 2 (two) times daily.   hydroxychloroquine 200 MG tablet Commonly known as:  PLAQUENIL TAKE 1 TABLET BY MOUTH TWICE A DAY WITH FOOD OR MILK   lisinopril-hydrochlorothiazide 10-12.5 MG tablet Commonly known as:  ZESTORETIC TAKE 1 TABLET BY MOUTH DAILY.   metFORMIN 750 MG 24 hr tablet Commonly known as:  GLUCOPHAGE-XR Take 2 tablets daily   Naltrexone-buPROPion HCl ER 8-90 MG Tb12 Commonly known as:  Contrave 1 tablet in a.m. for 2 weeks then 2 tablets in a.m. daily.  Add third tablet at dinner week 3 and a week 4take 2 tablets twice a day   rosuvastatin 10 MG tablet Commonly known as:  Crestor Take 1 tablet (10 mg total) by mouth daily.   Synthroid 75 MCG tablet Generic drug:  levothyroxine TAKE 1 TABLET BY MOUTH DAILY BEFORE BREAKFAST      REVIEW OF SYSTEMS:   Constitutional: Denies fevers, chills or abnormal weight loss Eyes: Denies blurriness of vision Ears, nose, mouth, throat, and face: Denies mucositis or sore throat Respiratory: Denies cough, dyspnea or wheezes Cardiovascular: Denies palpitation, chest discomfort Gastrointestinal:  Denies nausea, heartburn or change in bowel habits Skin: Denies abnormal  skin rashes Lymphatics: Denies new lymphadenopathy or easy bruising Neurological:Denies numbness, tingling or new weaknesses Behavioral/Psych: Mood is stable, no new changes  Extremities: No lower extremity edema Breast: denies any pain or lumps or nodules in either breasts All other systems were reviewed with the patient and are negative.   Observations/Objective:  There were no vitals filed for this visit. There is no height or weight on file to calculate BMI.  I have reviewed the data as listed CMP Latest Ref Rng & Units 02/15/2019 10/18/2018 06/10/2018  Glucose 70 - 99 mg/dL 108(H) 104(H) 105(H)  BUN 6 - 23 mg/dL 14 13 13   Creatinine 0.40 - 1.20 mg/dL 0.68 0.73 0.73  Sodium 135 - 145 mEq/L 139 136 136  Potassium 3.5 - 5.1 mEq/L 3.7 3.7 3.8  Chloride 96 - 112 mEq/L 103 100 99  CO2 19 - 32 mEq/L 28 26 30   Calcium 8.4 - 10.5 mg/dL 9.7 9.9 9.5  Total Protein 6.0 - 8.3 g/dL 7.0 7.6 7.4  Total Bilirubin 0.2 - 1.2 mg/dL 0.4 0.6 0.4  Alkaline Phos 39 - 117 U/L 55 53 57  AST 0 - 37 U/L 14 15 16   ALT 0 - 35 U/L 16 17 17     No results found for: WBC, HGB, HCT, MCV, PLT, NEUTROABS    Assessment Plan:  Malignant neoplasm of upper-outer quadrant of right breast in female, estrogen receptor positive ([place]) 03/01/2019:Diagnostic mammogram detected 2.5cm calcifications in right breast. Biopsy confirmed intermediate grade DCIS, ER+ 95%, PR+ 95%  Pathology review: I discussed with the patient the difference between DCIS and invasive breast cancer. It is considered a precancerous lesion. DCIS is classified as a 0. It is generally detected through mammograms as calcifications. We discussed the significance of grades and its impact on prognosis. We also discussed the importance of ER and PR receptors and their implications to adjuvant treatment options. Prognosis of DCIS dependence on grade, comedo necrosis. It is anticipated that if not treated, 20-30% of DCIS can develop into invasive breast cancer.  Recommendation: 1. Breast conserving surgery 2. Followed by adjuvant radiation therapy 3. Followed by antiestrogen therapy with tamoxifen 5 years  Tamoxifen counseling: We discussed the risks and benefits of tamoxifen. These include but not limited to insomnia, hot flashes, mood changes, vaginal dryness, and weight gain. Although rare,  serious side effects including endometrial cancer, risk of blood clots were also discussed. We strongly believe that the benefits far outweigh the risks. Patient understands these risks and consented to starting treatment. Planned treatment duration is 5 years.  WebEx visit after surgery to discuss the final pathology report and come up with an adjuvant treatment plan.  I discussed the assessment and treatment plan with the patient. The patient was provided an opportunity to ask questions and all were answered. The patient agreed with the plan and demonstrated an understanding of the instructions. The patient was advised to call back or seek an in-person evaluation if the symptoms worsen or if the condition fails to improve as anticipated.    Rulon Eisenmenger, MD 03/22/2019    I, Molly Dorshimer, am acting as scribe for Nicholas Lose, MD.  I have reviewed the above documentation for accuracy and completeness, and I agree with the above.

## 2019-03-22 ENCOUNTER — Inpatient Hospital Stay: Payer: 59

## 2019-03-22 ENCOUNTER — Inpatient Hospital Stay: Payer: 59 | Attending: Hematology and Oncology | Admitting: Hematology and Oncology

## 2019-03-22 ENCOUNTER — Inpatient Hospital Stay (HOSPITAL_BASED_OUTPATIENT_CLINIC_OR_DEPARTMENT_OTHER): Payer: 59 | Admitting: Genetic Counselor

## 2019-03-22 DIAGNOSIS — C50411 Malignant neoplasm of upper-outer quadrant of right female breast: Secondary | ICD-10-CM

## 2019-03-22 DIAGNOSIS — D0511 Intraductal carcinoma in situ of right breast: Secondary | ICD-10-CM

## 2019-03-22 DIAGNOSIS — Z803 Family history of malignant neoplasm of breast: Secondary | ICD-10-CM

## 2019-03-22 DIAGNOSIS — Z170 Estrogen receptor positive status [ER+]: Secondary | ICD-10-CM

## 2019-03-22 NOTE — Assessment & Plan Note (Signed)
03/01/2019:Diagnostic mammogram detected 2.5cm calcifications in right breast. Biopsy confirmed intermediate grade DCIS, ER+ 95%, PR+ 95%  Pathology review: I discussed with the patient the difference between DCIS and invasive breast cancer. It is considered a precancerous lesion. DCIS is classified as a 0. It is generally detected through mammograms as calcifications. We discussed the significance of grades and its impact on prognosis. We also discussed the importance of ER and PR receptors and their implications to adjuvant treatment options. Prognosis of DCIS dependence on grade, comedo necrosis. It is anticipated that if not treated, 20-30% of DCIS can develop into invasive breast cancer.  Recommendation: 1. Breast conserving surgery 2. Followed by adjuvant radiation therapy 3. Followed by antiestrogen therapy with tamoxifen 5 years  Tamoxifen counseling: We discussed the risks and benefits of tamoxifen. These include but not limited to insomnia, hot flashes, mood changes, vaginal dryness, and weight gain. Although rare, serious side effects including endometrial cancer, risk of blood clots were also discussed. We strongly believe that the benefits far outweigh the risks. Patient understands these risks and consented to starting treatment. Planned treatment duration is 5 years.  Return to clinic after surgery to discuss the final pathology report and come up with an adjuvant treatment plan.

## 2019-03-23 ENCOUNTER — Encounter: Payer: Self-pay | Admitting: Genetic Counselor

## 2019-03-23 DIAGNOSIS — Z803 Family history of malignant neoplasm of breast: Secondary | ICD-10-CM | POA: Insufficient documentation

## 2019-03-23 NOTE — Progress Notes (Addendum)
REFERRING PROVIDER: Nicholas Lose, MD [place], [place] [id number]  PRIMARY PROVIDER:  Lennie Odor, PA-C  PRIMARY REASON FOR VISIT:  1. Malignant neoplasm of upper-outer quadrant of right breast in female, estrogen receptor positive ([place])   2. Family history of breast cancer      HISTORY OF PRESENT ILLNESS:  I connected with Nicole Schwartz on 03/23/2019 at 3:00 PM EDT by Webex video conference and verified that I am speaking with the correct person using two identifiers.   Patient location: Home Provider location: Office  Nicole Schwartz, a 48 y.o. female, was seen for a [place] cancer genetics consultation at the request of Dr. Lindi Adie due to a personal and family history of cancer.  Nicole Schwartz presents to clinic today to discuss the possibility of a hereditary predisposition to cancer, genetic testing, and to further clarify her future cancer risks, as well as potential cancer risks for family members.   In April 2020, at the age of 89, Nicole Schwartz was diagnosed with cancer of the right breast.  The tumor is ER+/PR+/Her2-. The treatment plan is evolving.    CANCER HISTORY:    Malignant neoplasm of upper-outer quadrant of right breast in female, estrogen receptor positive ([place])   03/01/2019 Initial Diagnosis    Diagnostic mammogram detected 2.5cm calcifications in right breast. 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     03/09/2019 Cancer Staging    Staging form: Breast, AJCC 8th Edition - Clinical stage from 03/09/2019: Stage 0 (cTis (DCIS), cN0, cM0, ER+, PR+) - Signed by Gardenia Phlegm, NP on 03/09/2019      RISK FACTORS:  Menarche was at age 61.  First live birth at age 50.  OCP use for approximately 2 years.  Ovaries intact: yes.  Hysterectomy: no.  Menopausal status: perimenopausal.  HRT use: 0 years. Colonoscopy: no; not examined. Mammogram within the last year: yes. Number of breast biopsies: 1. Up to date with pelvic  exams: yes. Any excessive radiation exposure in the past: no  Past Medical History:  Diagnosis Date  . Family history of breast cancer     No past surgical history on file.  Social History   Socioeconomic History  . Marital status: Unknown    Spouse name: Not on file  . Number of children: Not on file  . Years of education: Not on file  . Highest education level: Not on file  Occupational History  . Not on file  Social Needs  . Financial resource strain: Not on file  . Food insecurity:    Worry: Not on file    Inability: Not on file  . Transportation needs:    Medical: Not on file    Non-medical: Not on file  Tobacco Use  . Smoking status: Never Smoker  . Smokeless tobacco: Never Used  Substance and Sexual Activity  . Alcohol use: Not on file  . Drug use: Not on file  . Sexual activity: Not on file  Lifestyle  . Physical activity:    Days per week: Not on file    Minutes per session: Not on file  . Stress: Not on file  Relationships  . Social connections:    Talks on phone: Not on file    Gets together: Not on file    Attends religious service: Not on file    Active member of club or organization: Not on file    Attends meetings of clubs or organizations: Not on file  Relationship status: Not on file  Other Topics Concern  . Not on file  Social History Narrative  . Not on file     FAMILY HISTORY:  We obtained a detailed, 4-generation family history.  Significant diagnoses are listed below: Family History  Problem Relation Age of Onset  . Thyroid disease Mother   . Diabetes Maternal Grandfather   . Hypertension Maternal Grandfather   . Diabetes Maternal Grandmother   . Breast cancer Paternal Grandmother     The patient has two sons who are cancer free.  She has two brothers and a sister who are cancer free.  Both parents are living.  The patient's mother has four brothers and a sister who are all cancer free.  There is no reported family history of  cancer on the maternal side.  The patient's father has one brother and three sisters.  None are reported to have cancer.  The paternal grandmother had breast cancer at age 18.  Nicole Schwartz is unaware of previous family history of genetic testing for hereditary cancer risks. Patient's maternal ancestors are of Caucasian descent, and paternal ancestors are of Caucasian descent. There is no reported Ashkenazi Jewish ancestry. There is no known consanguinity.  GENETIC COUNSELING ASSESSMENT: Nicole Schwartz is a 48 y.o. female with a personal and family history of breast cancer which is somewhat suggestive of a hereditary cancer syndrome and predisposition to cancer. We, therefore, discussed and recommended the following at today's visit.   DISCUSSION: We discussed that 5 - 10% of breast cancer is hereditary, with most cases associated with BRCA mutations.  There are other genes that can be associated with hereditary breast cancer syndromes.  They can be associated with only breast cancer as well as other cancers.  We discussed that testing is beneficial for several reasons including knowing how to follow individuals after completing their treatment, identifying whether potential treatment options such as PARP inhibitors would be beneficial, and understand if other family members could be at risk for cancer and allow them to undergo genetic testing.   We reviewed the characteristics, features and inheritance patterns of hereditary cancer syndromes. We also discussed genetic testing, including the appropriate family members to test, the process of testing, insurance coverage and turn-around-time for results. We discussed the implications of a negative, positive and/or variant of uncertain significant result. We recommended Nicole Schwartz pursue genetic testing for the common hereditary cancer gene panel. The Common Hereditary Gene Panel offered by [facility] includes sequencing and/or deletion duplication testing of the  following 48 genes: APC, ATM, AXIN2, BARD1, BMPR1A, BRCA1, BRCA2, BRIP1, CDH1, CDK4, CDKN2A (p14ARF), CDKN2A (p16INK4a), CHEK2, CTNNA1, DICER1, EPCAM (Deletion/duplication testing only), GREM1 (promoter region deletion/duplication testing only), KIT, MEN1, MLH1, MSH2, MSH3, MSH6, MUTYH, NBN, NF1, NHTL1, PALB2, PDGFRA, PMS2, POLD1, POLE, PTEN, RAD50, RAD51C, RAD51D, RNF43, SDHB, SDHC, SDHD, SMAD4, SMARCA4. STK11, TP53, TSC1, TSC2, and VHL.  The following genes were evaluated for sequence changes only: SDHA and HOXB13 c.251G>A variant only.   Based on Ms. Vegh's personal and family history of cancer, she meets medical criteria for genetic testing. Despite that she meets criteria, she may still have an out of pocket cost. We discussed that if her out of pocket cost for testing is over $100, the laboratory will call and confirm whether she wants to proceed with testing.  If the out of pocket cost of testing is less than $100 she will be billed by the genetic testing laboratory.   PLAN: After considering the risks, benefits,  and limitations, Nicole Schwartz provided informed consent to pursue genetic testing and the blood sample was sent to [facility] for analysis of the common hereditary cancer panel. Results should be available within approximately 2-3 weeks' time, at which point they will be disclosed by telephone to Nicole Schwartz, as will any additional recommendations warranted by these results. Nicole Schwartz will receive a summary of her genetic counseling visit and a copy of her results once available. This information will also be available in Epic.   Lastly, we encouraged Nicole Schwartz to remain in contact with cancer genetics annually so that we can continuously update the family history and inform her of any changes in cancer genetics and testing that may be of benefit for this family.   Ms. Folkerts questions were answered to her satisfaction today. Our contact information was provided should additional  questions or concerns arise. Thank you for the referral and allowing [place] to share in the care of your patient.   Levette Paulick P. Florene Glen, [place], [facility] Certified Genetic Counselor [email or web address] phone: [phone]  The patient was seen for a total of 35 minutes in face-to-face genetic counseling.  This patient was discussed with Drs. Magrinat, Lindi Adie and/or Burr Medico who agrees with the above.    _______________________________________________________________________ For Office Staff:  Number of people involved in session: 1 Was an Intern/ student involved with case: yes: [employer]

## 2019-03-24 ENCOUNTER — Encounter: Payer: Self-pay | Admitting: *Deleted

## 2019-03-31 ENCOUNTER — Other Ambulatory Visit: Payer: Self-pay | Admitting: Genetic Counselor

## 2019-03-31 ENCOUNTER — Telehealth: Payer: Self-pay | Admitting: Genetic Counselor

## 2019-03-31 DIAGNOSIS — Z170 Estrogen receptor positive status [ER+]: Principal | ICD-10-CM

## 2019-03-31 DIAGNOSIS — C50411 Malignant neoplasm of upper-outer quadrant of right female breast: Secondary | ICD-10-CM

## 2019-03-31 NOTE — Telephone Encounter (Signed)
Her saliva sample failed. Called and LM on VM to please call back.

## 2019-04-01 ENCOUNTER — Inpatient Hospital Stay: Payer: 59 | Attending: Hematology and Oncology

## 2019-04-01 ENCOUNTER — Other Ambulatory Visit: Payer: Self-pay

## 2019-04-01 DIAGNOSIS — Z170 Estrogen receptor positive status [ER+]: Principal | ICD-10-CM

## 2019-04-01 DIAGNOSIS — C50411 Malignant neoplasm of upper-outer quadrant of right female breast: Secondary | ICD-10-CM

## 2019-04-07 ENCOUNTER — Encounter: Payer: Self-pay | Admitting: Genetic Counselor

## 2019-04-07 ENCOUNTER — Telehealth: Payer: Self-pay | Admitting: Genetic Counselor

## 2019-04-07 DIAGNOSIS — Z1379 Encounter for other screening for genetic and chromosomal anomalies: Secondary | ICD-10-CM | POA: Insufficient documentation

## 2019-04-07 NOTE — Telephone Encounter (Signed)
LM on VM that results are back and to please call. 

## 2019-04-11 ENCOUNTER — Ambulatory Visit: Payer: Self-pay | Admitting: Genetic Counselor

## 2019-04-11 DIAGNOSIS — Z1379 Encounter for other screening for genetic and chromosomal anomalies: Secondary | ICD-10-CM

## 2019-04-11 NOTE — Progress Notes (Signed)
HPI:  [name] was previously seen in the [place] clinic due to a personal and family history of breast cancer and concerns regarding a hereditary predisposition to cancer. Please refer to our prior cancer genetics clinic note for more information regarding our discussion, assessment and recommendations, at the time. [name] recent genetic test results were disclosed to her, as were recommendations warranted by these results. These results and recommendations are discussed in more detail below.  CANCER HISTORY:    Malignant neoplasm of upper-outer quadrant of right breast in female, estrogen receptor positive ([place])   03/01/2019 Initial Diagnosis    Diagnostic mammogram detected 2.5cm calcifications in right breast. 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     03/09/2019 Cancer Staging    Staging form: Breast, AJCC 8th Edition - Clinical stage from 03/09/2019: Stage 0 (cTis (DCIS), cN0, cM0, ER+, PR+) - Signed by Gardenia Phlegm, NP on 03/09/2019    04/06/2019 Genetic Testing    Negative genetic testing on the common hereditary cancer panel.  The Common Hereditary Gene Panel offered by [facility] includes sequencing and/or deletion duplication testing of the following 48 genes: APC, ATM, AXIN2, BARD1, BMPR1A, BRCA1, BRCA2, BRIP1, CDH1, CDK4, CDKN2A (p14ARF), CDKN2A (p16INK4a), CHEK2, CTNNA1, DICER1, EPCAM (Deletion/duplication testing only), GREM1 (promoter region deletion/duplication testing only), KIT, MEN1, MLH1, MSH2, MSH3, MSH6, MUTYH, NBN, NF1, NHTL1, PALB2, PDGFRA, PMS2, POLD1, POLE, PTEN, RAD50, RAD51C, RAD51D, RNF43, SDHB, SDHC, SDHD, SMAD4, SMARCA4. STK11, TP53, TSC1, TSC2, and VHL.  The following genes were evaluated for sequence changes only: SDHA and HOXB13 c.251G>A variant only. The report date is Apr 06, 2019.     FAMILY HISTORY:  We obtained a detailed, 4-generation family history.  Significant diagnoses are listed below: Family History   Problem Relation Age of Onset  . Thyroid disease Mother   . Diabetes Maternal Grandfather   . Hypertension Maternal Grandfather   . Diabetes Maternal Grandmother   . Breast cancer Paternal Grandmother     The patient has two sons who are cancer free.  She has two brothers and a sister who are cancer free.  Both parents are living.  The patient's mother has four brothers and a sister who are all cancer free.  There is no reported family history of cancer on the maternal side.  The patient's father has one brother and three sisters.  None are reported to have cancer.  The paternal grandmother had breast cancer at age 19.  [name] is unaware of previous family history of genetic testing for hereditary cancer risks. Patient's maternal ancestors are of Caucasian descent, and paternal ancestors are of Caucasian descent. There is no reported Ashkenazi Jewish ancestry. There is no known consanguinity.   GENETIC TEST RESULTS: Genetic testing reported out on Apr 06, 2019 through the common hereditary cancer panel found no pathogenic mutations. The Common Hereditary Gene Panel offered by [facility] includes sequencing and/or deletion duplication testing of the following 48 genes: APC, ATM, AXIN2, BARD1, BMPR1A, BRCA1, BRCA2, BRIP1, CDH1, CDK4, CDKN2A (p14ARF), CDKN2A (p16INK4a), CHEK2, CTNNA1, DICER1, EPCAM (Deletion/duplication testing only), GREM1 (promoter region deletion/duplication testing only), KIT, MEN1, MLH1, MSH2, MSH3, MSH6, MUTYH, NBN, NF1, NHTL1, PALB2, PDGFRA, PMS2, POLD1, POLE, PTEN, RAD50, RAD51C, RAD51D, RNF43, SDHB, SDHC, SDHD, SMAD4, SMARCA4. STK11, TP53, TSC1, TSC2, and VHL.  The following genes were evaluated for sequence changes only: SDHA and HOXB13 c.251G>A variant only. The test report has been scanned into EPIC and is located under the Molecular Pathology section  of the Results Review tab.  A portion of the result report is included below for reference.     We discussed with  [name] that because current genetic testing is not perfect, it is possible there may be a gene mutation in one of these genes that current testing cannot detect, but that chance is small.  We also discussed, that there could be another gene that has not yet been discovered, or that we have not yet tested, that is responsible for the cancer diagnoses in the family. It is also possible there is a hereditary cause for the cancer in the family that [name] did not inherit and therefore was not identified in her testing.  Therefore, it is important to remain in touch with cancer genetics in the future so that we can continue to offer [name] the most up to date genetic testing.   ADDITIONAL GENETIC TESTING: We discussed with [name] that there are other genes that are associated with increased cancer risk that can be analyzed. Should [name] wish to pursue additional genetic testing, we are happy to discuss and coordinate this testing, at any time.    CANCER SCREENING RECOMMENDATIONS: [name] test result is considered negative (normal).  This means that we have not identified a hereditary cause for her personal and family history of cancer at this time. Most cancers happen by chance and this negative test suggests that her cancer may fall into this category.    While reassuring, this does not definitively rule out a hereditary predisposition to cancer. It is still possible that there could be genetic mutations that are undetectable by current technology. There could be genetic mutations in genes that have not been tested or identified to increase cancer risk.  Therefore, it is recommended she continue to follow the cancer management and screening guidelines provided by her oncology and primary healthcare provider.   An individual's cancer risk and medical management are not determined by genetic test results alone. Overall cancer risk assessment incorporates additional factors, including personal  medical history, family history, and any available genetic information that may result in a personalized plan for cancer prevention and surveillance  RECOMMENDATIONS FOR FAMILY MEMBERS:  Individuals in this family might be at some increased risk of developing cancer, over the general population risk, simply due to the family history of cancer.  We recommended women in this family have a yearly mammogram beginning at age 39, or 42 years younger than the earliest onset of cancer, an annual clinical breast exam, and perform monthly breast self-exams. Women in this family should also have a gynecological exam as recommended by their primary provider. All family members should have a colonoscopy by age 60.  FOLLOW-UP: Lastly, we discussed with [name] that cancer genetics is a rapidly advancing field and it is possible that new genetic tests will be appropriate for her and/or her family members in the future. We encouraged her to remain in contact with cancer genetics on an annual basis so we can update her personal and family histories and let her know of advances in cancer genetics that may benefit this family.   Our contact number was provided. [name] questions were answered to her satisfaction, and she knows she is welcome to call us at anytime with additional questions or concerns.   Roma Kayser, MS, [facility] Certified Genetic Counselor [email or web address]

## 2019-04-11 NOTE — Telephone Encounter (Signed)
Revealed negative genetic testing.  Discussed that we do not know why she has breast cancer or why there is cancer in the family. It could be due to a different gene that we are not testing, or maybe our current technology may not be able to pick something up.  It will be important for her to keep in contact with genetics to keep up with whether additional testing may be needed. 

## 2019-04-13 ENCOUNTER — Other Ambulatory Visit: Payer: Self-pay

## 2019-04-13 ENCOUNTER — Encounter (HOSPITAL_BASED_OUTPATIENT_CLINIC_OR_DEPARTMENT_OTHER): Payer: Self-pay | Admitting: *Deleted

## 2019-04-15 ENCOUNTER — Encounter (HOSPITAL_BASED_OUTPATIENT_CLINIC_OR_DEPARTMENT_OTHER)
Admission: RE | Admit: 2019-04-15 | Discharge: 2019-04-15 | Disposition: A | Discharge status: Home / Self Care | Payer: 59 | Source: Ambulatory Visit | Attending: Surgery | Admitting: Surgery

## 2019-04-15 DIAGNOSIS — Z7984 Long term (current) use of oral hypoglycemic drugs: Secondary | ICD-10-CM | POA: Diagnosis not present

## 2019-04-15 DIAGNOSIS — E039 Hypothyroidism, unspecified: Secondary | ICD-10-CM | POA: Diagnosis not present

## 2019-04-15 DIAGNOSIS — Z01818 Encounter for other preprocedural examination: Secondary | ICD-10-CM | POA: Diagnosis not present

## 2019-04-15 DIAGNOSIS — Z79890 Hormone replacement therapy (postmenopausal): Secondary | ICD-10-CM | POA: Diagnosis not present

## 2019-04-15 DIAGNOSIS — Z170 Estrogen receptor positive status [ER+]: Secondary | ICD-10-CM | POA: Diagnosis not present

## 2019-04-15 DIAGNOSIS — Z79899 Other long term (current) drug therapy: Secondary | ICD-10-CM | POA: Diagnosis not present

## 2019-04-15 DIAGNOSIS — I10 Essential (primary) hypertension: Secondary | ICD-10-CM | POA: Diagnosis not present

## 2019-04-15 DIAGNOSIS — D0511 Intraductal carcinoma in situ of right breast: Secondary | ICD-10-CM | POA: Diagnosis not present

## 2019-04-15 DIAGNOSIS — Z6841 Body Mass Index (BMI) 40.0 and over, adult: Secondary | ICD-10-CM | POA: Diagnosis not present

## 2019-04-15 LAB — BASIC METABOLIC PANEL
Anion gap: 12 (ref 5–15)
BUN: 10 mg/dL (ref 6–20)
CO2: 26 mmol/L (ref 22–32)
Calcium: 9.7 mg/dL (ref 8.9–10.3)
Chloride: 99 mmol/L (ref 98–111)
Creatinine, Ser: 0.78 mg/dL (ref 0.44–1.00)
GFR calc Af Amer: >60 mL/min (ref >60)
GFR calc non Af Amer: >60 mL/min (ref >60)
Glucose, Bld: 110 mg/dL — ABNORMAL HIGH (ref 70–99)
Potassium: 3.5 mmol/L (ref 3.5–5.1)
Sodium: 137 mmol/L (ref 135–145)

## 2019-04-15 LAB — CBC
HCT: 41.6 % (ref 36.0–46.0)
Hemoglobin: 14.3 g/dL (ref 12.0–15.0)
MCH: 29.6 pg (ref 26.0–34.0)
MCHC: 34.4 g/dL (ref 30.0–36.0)
MCV: 86.1 fL (ref 80.0–100.0)
Platelets: 281 10*3/uL (ref 150–400)
RBC: 4.83 MIL/uL (ref 3.87–5.11)
RDW: 12.3 % (ref 11.5–15.5)
WBC: 5.4 10*3/uL (ref 4.0–10.5)
nRBC: 0 % (ref 0.0–0.2)

## 2019-04-15 NOTE — Progress Notes (Signed)
Pt here for blood work, EKG, UPT. Gave ensure presurgery drink with instructions to have drink completed by 0430 on DOS. Pt verbalized understanding.

## 2019-04-18 ENCOUNTER — Other Ambulatory Visit (HOSPITAL_COMMUNITY)
Admission: RE | Admit: 2019-04-18 | Discharge: 2019-04-18 | Disposition: A | Discharge status: Home / Self Care | Payer: 59 | Source: Ambulatory Visit | Attending: Surgery | Admitting: Surgery

## 2019-04-18 ENCOUNTER — Ambulatory Visit
Admission: RE | Admit: 2019-04-18 | Discharge: 2019-04-18 | Disposition: A | Discharge status: Home / Self Care | Payer: 59 | Source: Ambulatory Visit | Attending: Surgery | Admitting: Surgery

## 2019-04-18 ENCOUNTER — Other Ambulatory Visit: Payer: Self-pay

## 2019-04-18 DIAGNOSIS — D0511 Intraductal carcinoma in situ of right breast: Secondary | ICD-10-CM

## 2019-04-18 DIAGNOSIS — Z01818 Encounter for other preprocedural examination: Secondary | ICD-10-CM | POA: Diagnosis not present

## 2019-04-19 LAB — NOVEL CORONAVIRUS, NAA (HOSP ORDER, SEND-OUT TO REF LAB; TAT 18-24 HRS): SARS-CoV-2, NAA: NOT DETECTED

## 2019-04-19 NOTE — Anesthesia Preprocedure Evaluation (Addendum)
Anesthesia Evaluation  Patient identified by MRN, date of birth, ID band Patient awake    Reviewed: Allergy & Precautions, NPO status , Patient's Chart, lab work & pertinent test results  History of Anesthesia Complications Negative for: history of anesthetic complications  Airway Mallampati: II  TM Distance: >3 FB Neck ROM: Full    Dental  (+) Partial Upper, Partial Lower, Poor Dentition, Dental Advisory Given   Pulmonary neg pulmonary ROS,    Pulmonary exam normal        Cardiovascular hypertension, Pt. on medications Normal cardiovascular exam     Neuro/Psych negative neurological ROS     GI/Hepatic negative GI ROS, Neg liver ROS,   Endo/Other  Hypothyroidism Morbid obesity  Renal/GU negative Renal ROS     Musculoskeletal negative musculoskeletal ROS (+)   Abdominal   Peds  Hematology negative hematology ROS (+)   Anesthesia Other Findings Day of surgery medications reviewed with the patient.  Reproductive/Obstetrics                            Anesthesia Physical Anesthesia Plan  ASA: III  Anesthesia Plan: General   Post-op Pain Management:    Induction: Intravenous  PONV Risk Score and Plan: 4 or greater and Ondansetron, Dexamethasone, Scopolamine patch - Pre-op and Diphenhydramine  Airway Management Planned: LMA and Oral ETT  Additional Equipment:   Intra-op Plan:   Post-operative Plan: Extubation in OR  Informed Consent: I have reviewed the patients History and Physical, chart, labs and discussed the procedure including the risks, benefits and alternatives for the proposed anesthesia with the patient or authorized representative who has indicated his/her understanding and acceptance.     Dental advisory given  Plan Discussed with: Anesthesiologist, CRNA and Surgeon  Anesthesia Plan Comments:        Anesthesia Quick Evaluation

## 2019-04-20 ENCOUNTER — Ambulatory Visit
Admission: RE | Admit: 2019-04-20 | Discharge: 2019-04-20 | Disposition: A | Discharge status: Home / Self Care | Payer: 59 | Source: Ambulatory Visit | Attending: Surgery | Admitting: Surgery

## 2019-04-20 ENCOUNTER — Ambulatory Visit (HOSPITAL_BASED_OUTPATIENT_CLINIC_OR_DEPARTMENT_OTHER): Payer: 59 | Admitting: Anesthesiology

## 2019-04-20 ENCOUNTER — Encounter (HOSPITAL_BASED_OUTPATIENT_CLINIC_OR_DEPARTMENT_OTHER): Payer: Self-pay | Admitting: Certified Registered"

## 2019-04-20 ENCOUNTER — Encounter (HOSPITAL_BASED_OUTPATIENT_CLINIC_OR_DEPARTMENT_OTHER)
Admission: RE | Disposition: A | Discharge status: Home / Self Care | Payer: Self-pay | Source: Home / Self Care | Attending: Surgery

## 2019-04-20 ENCOUNTER — Ambulatory Visit (HOSPITAL_BASED_OUTPATIENT_CLINIC_OR_DEPARTMENT_OTHER)
Admission: RE | Admit: 2019-04-20 | Discharge: 2019-04-20 | Disposition: A | Discharge status: Home / Self Care | Payer: 59 | Attending: Surgery | Admitting: Surgery

## 2019-04-20 ENCOUNTER — Other Ambulatory Visit: Payer: Self-pay

## 2019-04-20 DIAGNOSIS — Z79890 Hormone replacement therapy (postmenopausal): Secondary | ICD-10-CM | POA: Insufficient documentation

## 2019-04-20 DIAGNOSIS — E039 Hypothyroidism, unspecified: Secondary | ICD-10-CM | POA: Insufficient documentation

## 2019-04-20 DIAGNOSIS — Z170 Estrogen receptor positive status [ER+]: Secondary | ICD-10-CM | POA: Insufficient documentation

## 2019-04-20 DIAGNOSIS — Z6841 Body Mass Index (BMI) 40.0 and over, adult: Secondary | ICD-10-CM | POA: Insufficient documentation

## 2019-04-20 DIAGNOSIS — D0511 Intraductal carcinoma in situ of right breast: Secondary | ICD-10-CM | POA: Insufficient documentation

## 2019-04-20 DIAGNOSIS — Z7984 Long term (current) use of oral hypoglycemic drugs: Secondary | ICD-10-CM | POA: Insufficient documentation

## 2019-04-20 DIAGNOSIS — Z79899 Other long term (current) drug therapy: Secondary | ICD-10-CM | POA: Insufficient documentation

## 2019-04-20 DIAGNOSIS — I10 Essential (primary) hypertension: Secondary | ICD-10-CM | POA: Insufficient documentation

## 2019-04-20 HISTORY — DX: Unspecified osteoarthritis, unspecified site: M19.90

## 2019-04-20 HISTORY — PX: BREAST LUMPECTOMY: SHX2

## 2019-04-20 HISTORY — DX: Hypothyroidism, unspecified: E03.9

## 2019-04-20 HISTORY — PX: BREAST LUMPECTOMY WITH RADIOACTIVE SEED LOCALIZATION: SHX6424

## 2019-04-20 HISTORY — DX: Essential (primary) hypertension: I10

## 2019-04-20 LAB — POCT PREGNANCY, URINE: Preg Test, Ur: NEGATIVE

## 2019-04-20 LAB — GLUCOSE, CAPILLARY: Glucose-Capillary: 92 mg/dL (ref 70–99)

## 2019-04-20 SURGERY — BREAST LUMPECTOMY WITH RADIOACTIVE SEED LOCALIZATION
Anesthesia: General | Site: Breast | Laterality: Right

## 2019-04-20 MED ORDER — GABAPENTIN 300 MG PO CAPS
ORAL_CAPSULE | ORAL | Status: AC
  Filled 2019-04-20: qty 1

## 2019-04-20 MED ORDER — CHLORHEXIDINE GLUCONATE CLOTH 2 % EX PADS: 6.0000 | MEDICATED_PAD | Freq: Once | CUTANEOUS | Status: DC

## 2019-04-20 MED ORDER — SCOPOLAMINE 1 MG/3DAYS TD PT72
1.0000 | MEDICATED_PATCH | Freq: Once | TRANSDERMAL | Status: DC | PRN
  Administered 2019-04-20: 07:00:00 1.5 mg via TRANSDERMAL

## 2019-04-20 MED ORDER — DEXAMETHASONE SODIUM PHOSPHATE 4 MG/ML IJ SOLN
INTRAMUSCULAR | Status: DC | PRN
  Administered 2019-04-20: 4 mg via INTRAVENOUS

## 2019-04-20 MED ORDER — FENTANYL CITRATE (PF) 100 MCG/2ML IJ SOLN
INTRAMUSCULAR | Status: AC
  Filled 2019-04-20: qty 2

## 2019-04-20 MED ORDER — CEFAZOLIN SODIUM-DEXTROSE 2-4 GM/100ML-% IV SOLN
INTRAVENOUS | Status: AC
  Filled 2019-04-20: qty 100

## 2019-04-20 MED ORDER — BUPIVACAINE-EPINEPHRINE (PF) 0.25% -1:200000 IJ SOLN
INTRAMUSCULAR | Status: AC
  Filled 2019-04-20: qty 60

## 2019-04-20 MED ORDER — SCOPOLAMINE 1 MG/3DAYS TD PT72
MEDICATED_PATCH | TRANSDERMAL | Status: AC
  Filled 2019-04-20: qty 1

## 2019-04-20 MED ORDER — ONDANSETRON HCL 4 MG/2ML IJ SOLN
INTRAMUSCULAR | Status: DC | PRN
  Administered 2019-04-20: 4 mg via INTRAVENOUS

## 2019-04-20 MED ORDER — FENTANYL CITRATE (PF) 100 MCG/2ML IJ SOLN
25.0000 ug | INTRAMUSCULAR | Status: DC | PRN
  Administered 2019-04-20: 25 ug via INTRAVENOUS

## 2019-04-20 MED ORDER — PROPOFOL 10 MG/ML IV BOLUS
INTRAVENOUS | Status: DC | PRN
  Administered 2019-04-20: 200 mg via INTRAVENOUS

## 2019-04-20 MED ORDER — GABAPENTIN 300 MG PO CAPS
300.0000 mg | ORAL_CAPSULE | ORAL | Status: AC
  Administered 2019-04-20: 300 mg via ORAL

## 2019-04-20 MED ORDER — SCOPOLAMINE 1 MG/3DAYS TD PT72: 1.0000 | MEDICATED_PATCH | TRANSDERMAL | Status: DC

## 2019-04-20 MED ORDER — ACETAMINOPHEN 500 MG PO TABS
ORAL_TABLET | ORAL | Status: AC
  Filled 2019-04-20: qty 2

## 2019-04-20 MED ORDER — 0.9 % SODIUM CHLORIDE (POUR BTL) OPTIME
TOPICAL | Status: DC | PRN
  Administered 2019-04-20: 300 mL

## 2019-04-20 MED ORDER — HYDROCODONE-ACETAMINOPHEN 5-325 MG PO TABS: 1.0000 | ORAL_TABLET | Freq: Four times a day (QID) | ORAL | 0 refills | Status: DC | PRN

## 2019-04-20 MED ORDER — CELECOXIB 200 MG PO CAPS
ORAL_CAPSULE | ORAL | Status: AC
  Filled 2019-04-20: qty 1

## 2019-04-20 MED ORDER — METHYLENE BLUE 0.5 % INJ SOLN
INTRAVENOUS | Status: AC
  Filled 2019-04-20: qty 10

## 2019-04-20 MED ORDER — SODIUM CHLORIDE (PF) 0.9 % IJ SOLN
INTRAMUSCULAR | Status: AC
  Filled 2019-04-20: qty 10

## 2019-04-20 MED ORDER — MIDAZOLAM HCL 2 MG/2ML IJ SOLN
1.0000 mg | INTRAMUSCULAR | Status: DC | PRN
  Administered 2019-04-20: 2 mg via INTRAVENOUS

## 2019-04-20 MED ORDER — MIDAZOLAM HCL 2 MG/2ML IJ SOLN
INTRAMUSCULAR | Status: AC
  Filled 2019-04-20: qty 2

## 2019-04-20 MED ORDER — PROMETHAZINE HCL 25 MG/ML IJ SOLN: 6.2500 mg | INTRAMUSCULAR | Status: DC | PRN

## 2019-04-20 MED ORDER — ACETAMINOPHEN 500 MG PO TABS
1000.0000 mg | ORAL_TABLET | ORAL | Status: AC
  Administered 2019-04-20: 1000 mg via ORAL

## 2019-04-20 MED ORDER — CEFAZOLIN SODIUM-DEXTROSE 2-4 GM/100ML-% IV SOLN: 2.0000 g | INTRAVENOUS | Status: DC

## 2019-04-20 MED ORDER — SCOPOLAMINE 1 MG/3DAYS TD PT72: 1.0000 | MEDICATED_PATCH | Freq: Once | TRANSDERMAL | Status: DC | PRN

## 2019-04-20 MED ORDER — LACTATED RINGERS IV SOLN
INTRAVENOUS | Status: DC
  Administered 2019-04-20: 08:00:00 via INTRAVENOUS

## 2019-04-20 MED ORDER — FENTANYL CITRATE (PF) 100 MCG/2ML IJ SOLN
50.0000 ug | INTRAMUSCULAR | Status: DC | PRN
  Administered 2019-04-20: 50 ug via INTRAVENOUS

## 2019-04-20 MED ORDER — BUPIVACAINE-EPINEPHRINE (PF) 0.25% -1:200000 IJ SOLN
INTRAMUSCULAR | Status: DC | PRN
  Administered 2019-04-20: 10 mL

## 2019-04-20 MED ORDER — LIDOCAINE HCL (CARDIAC) PF 100 MG/5ML IV SOSY
PREFILLED_SYRINGE | INTRAVENOUS | Status: DC | PRN
  Administered 2019-04-20: 60 mg via INTRAVENOUS

## 2019-04-20 MED ORDER — IBUPROFEN 800 MG PO TABS: 800.0000 mg | ORAL_TABLET | Freq: Three times a day (TID) | ORAL | 0 refills | Status: DC | PRN

## 2019-04-20 MED ORDER — CELECOXIB 200 MG PO CAPS
200.0000 mg | ORAL_CAPSULE | ORAL | Status: AC
  Administered 2019-04-20: 200 mg via ORAL

## 2019-04-20 SURGICAL SUPPLY — 50 items
APPLIER CLIP 9.375 MED OPEN (MISCELLANEOUS) ×2
BINDER BREAST 3XL (GAUZE/BANDAGES/DRESSINGS) ×2 IMPLANT
BINDER BREAST LRG (GAUZE/BANDAGES/DRESSINGS) IMPLANT
BINDER BREAST MEDIUM (GAUZE/BANDAGES/DRESSINGS) IMPLANT
BINDER BREAST XLRG (GAUZE/BANDAGES/DRESSINGS) IMPLANT
BINDER BREAST XXLRG (GAUZE/BANDAGES/DRESSINGS) IMPLANT
BLADE SURG 15 STRL LF DISP TIS (BLADE) ×1 IMPLANT
BLADE SURG 15 STRL SS (BLADE) ×1
CANISTER SUC SOCK COL 7IN (MISCELLANEOUS) IMPLANT
CANISTER SUCT 1200ML W/VALVE (MISCELLANEOUS) ×2 IMPLANT
CHLORAPREP W/TINT 26 (MISCELLANEOUS) ×2 IMPLANT
CLIP APPLIE 9.375 MED OPEN (MISCELLANEOUS) ×1 IMPLANT
COVER BACK TABLE REUSABLE LG (DRAPES) ×2 IMPLANT
COVER MAYO STAND REUSABLE (DRAPES) ×2 IMPLANT
COVER PROBE W GEL 5X96 (DRAPES) ×2 IMPLANT
COVER WAND RF STERILE (DRAPES) IMPLANT
DECANTER SPIKE VIAL GLASS SM (MISCELLANEOUS) IMPLANT
DERMABOND ADVANCED (GAUZE/BANDAGES/DRESSINGS) ×1
DERMABOND ADVANCED .7 DNX12 (GAUZE/BANDAGES/DRESSINGS) ×1 IMPLANT
DRAPE LAPAROSCOPIC ABDOMINAL (DRAPES) ×2 IMPLANT
DRAPE LAPAROTOMY 100X72 PEDS (DRAPES) IMPLANT
DRAPE UTILITY XL STRL (DRAPES) ×2 IMPLANT
ELECT COATED BLADE 2.86 ST (ELECTRODE) ×2 IMPLANT
ELECT REM PT RETURN 9FT ADLT (ELECTROSURGICAL) ×2
ELECTRODE REM PT RTRN 9FT ADLT (ELECTROSURGICAL) ×1 IMPLANT
GLOVE BIO SURGEON STRL SZ 6.5 (GLOVE) ×2 IMPLANT
GLOVE BIOGEL PI IND STRL 7.0 (GLOVE) ×1 IMPLANT
GLOVE BIOGEL PI IND STRL 8 (GLOVE) ×1 IMPLANT
GLOVE BIOGEL PI INDICATOR 7.0 (GLOVE) ×1
GLOVE BIOGEL PI INDICATOR 8 (GLOVE) ×1
GLOVE ECLIPSE 8.0 STRL XLNG CF (GLOVE) ×2 IMPLANT
GOWN STRL REUS W/ TWL LRG LVL3 (GOWN DISPOSABLE) ×2 IMPLANT
GOWN STRL REUS W/TWL LRG LVL3 (GOWN DISPOSABLE) ×2
HEMOSTAT ARISTA ABSORB 3G PWDR (HEMOSTASIS) IMPLANT
HEMOSTAT SNOW SURGICEL 2X4 (HEMOSTASIS) IMPLANT
KIT MARKER MARGIN INK (KITS) ×2 IMPLANT
NEEDLE HYPO 25X1 1.5 SAFETY (NEEDLE) ×2 IMPLANT
NS IRRIG 1000ML POUR BTL (IV SOLUTION) ×2 IMPLANT
PACK BASIN DAY SURGERY FS (CUSTOM PROCEDURE TRAY) ×2 IMPLANT
PENCIL BUTTON HOLSTER BLD 10FT (ELECTRODE) ×2 IMPLANT
SLEEVE SCD COMPRESS KNEE MED (MISCELLANEOUS) ×2 IMPLANT
SPONGE LAP 4X18 RFD (DISPOSABLE) ×2 IMPLANT
SUT MNCRL AB 4-0 PS2 18 (SUTURE) ×2 IMPLANT
SUT SILK 2 0 SH (SUTURE) ×2 IMPLANT
SUT VICRYL 3-0 CR8 SH (SUTURE) ×2 IMPLANT
SYR CONTROL 10ML LL (SYRINGE) ×2 IMPLANT
TOWEL GREEN STERILE FF (TOWEL DISPOSABLE) ×2 IMPLANT
TRAY FAXITRON CT DISP (TRAY / TRAY PROCEDURE) ×2 IMPLANT
TUBE CONNECTING 20X1/4 (TUBING) IMPLANT
YANKAUER SUCT BULB TIP NO VENT (SUCTIONS) IMPLANT

## 2019-04-20 NOTE — H&P (Signed)
[place]: [facility] Patient #: [id number] DOB: 1971-02-19 Married / Language: English / Race: White Female  History of Present Illness Patient words: Patient sent at the request of Nicole. Philis Pique for screening detected mammographic abnormality of her right breast. Calcifications are noted in the right breast upper outer quadrant. 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 Area measured 2.5 cm mammography. She has no complaints of pain, nipple discharge or breast mass.      ADDITIONAL INFORMATION: Immunostain for E-cadherin is positive, consistent with a ductal phenotype. Nicole Folds MD Pathologist, Electronic Signature ( Signed 03/01/2019) PROGNOSTIC INDICATORS Results: IMMUNOHISTOCHEMICAL AND MORPHOMETRIC ANALYSIS PERFORMED MANUALLY Estrogen Receptor: 95%, POSITIVE, STRONG STAINING INTENSITY Progesterone Receptor: 95%, POSITIVE, STRONG STAINING INTENSITY REFERENCE RANGE ESTROGEN RECEPTOR NEGATIVE 0% POSITIVE =>1% REFERENCE RANGE PROGESTERONE RECEPTOR NEGATIVE 0% POSITIVE =>1% All controls stained appropriately Nicole Males MD Pathologist, Electronic Signature ( Signed 02/28/2019) FINAL DIAGNOSIS 1 of 2 FINAL for Nicole Schwartz, Nicole Schwartz ([id number]) Diagnosis Breast, right, needle core biopsy, upper outer quadrant at middle depth - MAMMARY CARCINOMA IN SITU, INTERMEDIATE NUCLEAR GRADE WITH CALCIFICATIONS. SEE NOTE. Diagnosis Note Mammary carcinoma in situ measures 5.5 mm in greatest dimension. Immunohistochemical stain for E-cadherin is pending for characterization of the tumor phenotype. Nicole Schwartz has reviewed this case and concurs with the above interpretation. A breast prognostic profile (ER and PR) is pending and will be reported in an addendum. The [place] was notified on 02/25/2019. (NK:ecj 02/25/2019) Nicole Folds MD Pathologist, Electronic Signature (Case signed 02/25/2019) Specimen Gross and  Clinical Information Specimen Comment Extracted < 5 minutes; screening detected suspicious approximate 2.5cm group of calcifications involving the upper outer quadrant of the right breast at middle depth Specimen(s) Obtained: Breast, right, needle core biopsy, upper outer quadrant at middle depth Specimen Clinical Information ADH/DCIS vs lobular neoplasia and less likely [facility] or fibroadenomatoid changes          Screening recall for right breast calcifications.  EXAM: DIGITAL DIAGNOSTIC RIGHT MAMMOGRAM WITH CAD  COMPARISON: Previous exam(s).  ACR Breast Density Category b: There are scattered areas of fibroglandular density.  FINDINGS: In the lateral right breast, middle to posterior depth there is a 2.5 cm group of punctate calcifications.  Mammographic images were processed with CAD.  IMPRESSION: Indeterminate 2.5 cm group of punctate calcifications in the lateral right breast.  RECOMMENDATION: Stereotactic biopsy is recommended for the right breast calcifications. The procedure has been scheduled for 02/24/2019 at 8:30 a.m.  I have discussed the findings and recommendations with the patient. Results were also provided in writing at the conclusion of the visit. If applicable, a reminder letter will be sent to the patient regarding the next appointment.  BI-RADS CATEGORY 4: Suspicious.   Electronically Signed By: Nicole Schwartz M.D. On: 02/22/2019 14:20.  The patient is a 48 year old female.   Past Surgical History Nicole Schwartz, CMA; 03/03/2019 9:26 AM) Breast Biopsy Right. Cesarean Section - Multiple  Diagnostic Studies History Nicole Schwartz, CMA; 03/03/2019 9:26 AM) Colonoscopy never Mammogram within last year Pap Smear 1-5 years ago  Schwartz (Nicole Schwartz [ No Known Drug Schwartz : Schwartz Reconciled  Medication History (Nicole Schwartz, CMA;  Synthroid (50MCG Tablet, Oral) Active. Diclofenac  Potassium (50MG  Tablet, Oral) Active. Lisinopril (2.5MG  Tablet, Oral) Active. metFORMIN HCl ER (750MG  Tablet ER 24HR, Oral) Active. Medications Reconciled  Social History Nicole Schwartz, CMA;  Caffeine use Carbonated beverages, Tea. No alcohol use No drug use Tobacco use Never smoker.  Family History Sales executive  [place], CMA;  Hypertension Mother. Thyroid problems Brother, Mother, Son.  Pregnancy / Birth History ( Age at menarche 43 years. Gravida 2 Length (months) of breastfeeding 3-6 Maternal age 22-20 Para 2 Regular periods  Other Problems ([place], CMA;  Arthritis High blood pressure Thyroid Disease     Review of Systems  General Not Present- Appetite Loss, Chills, Fatigue, Fever, Night Sweats, Weight Gain and Weight Loss. Skin Not Present- Change in Wart/Mole, Dryness, Hives, Jaundice, New Lesions, Non-Healing Wounds, Rash and Ulcer. HEENT Not Present- Earache, Hearing Loss, Hoarseness, Nose Bleed, Oral Ulcers, Ringing in the Ears, Seasonal Schwartz, Sinus Pain, Sore Throat, Visual Disturbances, Wears glasses/contact lenses and Yellow Eyes. Respiratory Not Present- Bloody sputum, Chronic Cough, Difficulty Breathing, Snoring and Wheezing. Breast Not Present- Breast Mass, Breast Pain, Nipple Discharge and Skin Changes. Cardiovascular Not Present- Chest Pain, Difficulty Breathing Lying Down, Leg Cramps, Palpitations, Rapid Heart Rate, Shortness of Breath and Swelling of Extremities. Gastrointestinal Not Present- Abdominal Pain, Bloating, Bloody Stool, Change in Bowel Habits, Chronic diarrhea, Constipation, Difficulty Swallowing, Excessive gas, Gets full quickly at meals, Hemorrhoids, Indigestion, Nausea, Rectal Pain and Vomiting. Female Genitourinary Not Present- Frequency, Nocturia, Painful Urination, Pelvic Pain and Urgency. Musculoskeletal Not Present- Back Pain, Joint Pain, Joint Stiffness, Muscle Pain, Muscle Weakness and Swelling of  Extremities. Neurological Not Present- Decreased Memory, Fainting, Headaches, Numbness, Seizures, Tingling, Tremor, Trouble walking and Weakness. Psychiatric Not Present- Anxiety, Bipolar, Change in Sleep Pattern, Depression, Fearful and Frequent crying. Endocrine Not Present- Cold Intolerance, Excessive Hunger, Hair Changes, Heat Intolerance, Hot flashes and New Diabetes. Hematology Not Present- Blood Thinners, Easy Bruising, Excessive bleeding, Gland problems, HIV and Persistent Infections.  Vitals   Weight: 249 lb Height: 61in Body Surface Area: 2.07 m Body Mass Index: 47.05 kg/m  Temp.: 98.30F(Oral)  Pulse: 111 (Regular)  P.OX: 97% (Room air) BP: 162/90 (Sitting, Left Arm, Standard)      Physical Exam   General Mental Status-Alert. General Appearance-Consistent with stated age. Hydration-Well hydrated. Voice-Normal.  Head and Neck Head-normocephalic, atraumatic with no lesions or palpable masses. Trachea-midline. Thyroid Gland Characteristics - normal size and consistency.  Chest and Lung Exam Chest and lung exam reveals -quiet, even and easy respiratory effort with no use of accessory muscles and on auscultation, normal breath sounds, no adventitious sounds and normal vocal resonance. Inspection Chest Wall - Normal. Back - normal.  Breast Breast - Left-Symmetric, Non Tender, No Biopsy scars, no Dimpling, No Inflammation, No Lumpectomy scars, No Mastectomy scars, No Peau d' Orange. Breast - Right-Symmetric, Non Tender, No Biopsy scars, no Dimpling, No Inflammation, No Lumpectomy scars, No Mastectomy scars, No Peau d' Orange. Breast Lump-No Palpable Breast Mass.  Cardiovascular Cardiovascular examination reveals -normal heart sounds, regular rate and rhythm with no murmurs and normal pedal pulses bilaterally.  Neurologic Neurologic evaluation reveals -alert and oriented x 3 with no impairment of recent or remote  memory. Mental Status-Normal.  Musculoskeletal Normal Exam - Left-Upper Extremity Strength Normal and Lower Extremity Strength Normal. Normal Exam - Right-Upper Extremity Strength Normal and Lower Extremity Strength Normal.  Lymphatic Head & Neck  General Head & Neck Lymphatics: Bilateral - Description - Normal. Axillary  General Axillary Region: Bilateral - Description - Normal. Tenderness - Non Tender.    Assessment & Plan   BREAST NEOPLASM, TIS (DCIS), RIGHT (D05.11) Impression: GENETICS  Risk of lumpectomy include bleeding, infection, seroma, more surgery, use of seed/wire, wound care, cosmetic deformity and the need for other treatments, death , blood clots, death. Pt agrees to proceed.  RIGHT BREAST SEED LUMPECTOMY  Discussed all treatment options as well as the need for genetics, radiation therapy, possible medical oncology and reviewed options of breast conservation versus mastectomy and reconstruction.  Current Plans Pt Education - [facility] - General recommendations Pt Education - Education: Pathology Report given to patient You are being scheduled for surgery- Our schedulers will call you.  You should hear from our office's scheduling department within 5 working days about the location, date, and time of surgery. We try to make accommodations for patient's preferences in scheduling surgery, but sometimes the OR schedule or the surgeon's schedule prevents [place] from making those accommodations.  If you have not heard from our office [phone]) in 5 working days, call the office and ask for your surgeon's nurse.  If you have other questions about your diagnosis, plan, or surgery, call the office and ask for your surgeon's nurse.  We discussed the staging and pathophysiology of breast cancer. We discussed all of the different options for treatment for breast cancer including surgery, chemotherapy, radiation therapy, Herceptin, and antiestrogen  therapy. We discussed a sentinel lymph node biopsy as she does not appear to having lymph node involvement right now. We discussed the performance of that with injection of radioactive tracer and blue dye. We discussed that she would have an incision underneath her axillary hairline. We discussed that there is a bout a 10-20% chance of having a positive node with a sentinel lymph node biopsy and we will await the permanent pathology to make any other first further decisions in terms of her treatment. One of these options might be to return to the operating room to perform an axillary lymph node dissection. We discussed about a 1-2% risk lifetime of chronic shoulder pain as well as lymphedema associated with a sentinel lymph node biopsy. We discussed the options for treatment of the breast cancer which included lumpectomy versus a mastectomy. We discussed the performance of the lumpectomy with a wire placement. We discussed a 10-20% chance of a positive margin requiring reexcision in the operating room. We also discussed that she may need radiation therapy or antiestrogen therapy or both if she undergoes lumpectomy. We discussed the mastectomy and the postoperative care for that as well. We discussed that there is no difference in her survival whether she undergoes lumpectomy with radiation therapy or antiestrogen therapy versus a mastectomy. There is a slight difference in the local recurrence rate being 3-5% with lumpectomy and about 1% with a mastectomy. We discussed the risks of operation including bleeding, infection, possible reoperation. She understands her further therapy will be based on what her stages at the time of her operation.  Pt Education - [facility] Breast Biopsy HCI: discussed with patient and provided

## 2019-04-20 NOTE — Interval H&P Note (Signed)
History and Physical Interval Note:  04/20/2019 8:22 AM  Nicole Schwartz  has presented today for surgery, with the diagnosis of DUCTAL CARCINOMA IN SITU RIGHT BREAST.  The various methods of treatment have been discussed with the patient and family. After consideration of risks, benefits and other options for treatment, the patient has consented to  Procedure(s): RIGHT BREAST LUMPECTOMY WITH RADIOACTIVE SEED LOCALIZATION (Right) as a surgical intervention.  The patient's history has been reviewed, patient examined, no change in status, stable for surgery.  I have reviewed the patient's chart and labs.  Questions were answered to the patient's satisfaction.     [place]

## 2019-04-20 NOTE — Discharge Instructions (Signed)
[facility] °Office Phone Number [phone] ° °BREAST BIOPSY/ PARTIAL MASTECTOMY: POST OP INSTRUCTIONS ° °Always review your discharge instruction sheet given to you by the facility where your surgery was performed. ° °IF YOU HAVE DISABILITY OR FAMILY LEAVE FORMS, YOU MUST BRING THEM TO THE OFFICE FOR PROCESSING.  DO NOT GIVE THEM TO YOUR DOCTOR. ° °1. A prescription for pain medication may be given to you upon discharge.  Take your pain medication as prescribed, if needed.  If narcotic pain medicine is not needed, then you may take acetaminophen (Tylenol) or ibuprofen (Advil) as needed. °2. Take your usually prescribed medications unless otherwise directed °3. If you need a refill on your pain medication, please contact your pharmacy.  They will contact our office to request authorization.  Prescriptions will not be filled after 5pm or on week-ends. °4. You should eat very light the first 24 hours after surgery, such as soup, crackers, pudding, etc.  Resume your normal diet the day after surgery. °5. Most patients will experience some swelling and bruising in the breast.  Ice packs and a good support bra will help.  Swelling and bruising can take several days to resolve.  °6. It is common to experience some constipation if taking pain medication after surgery.  Increasing fluid intake and taking a stool softener will usually help or prevent this problem from occurring.  A mild laxative (Milk of Magnesia or Miralax) should be taken according to package directions if there are no bowel movements after 48 hours. °7. Unless discharge instructions indicate otherwise, you may remove your bandages 24-48 hours after surgery, and you may shower at that time.  You may have steri-strips (small skin tapes) in place directly over the incision.  These strips should be left on the skin for 7-10 days.  If your surgeon used skin glue on the incision, you may shower in 24 hours.  The glue will flake off over the  next 2-3 weeks.  Any sutures or staples will be removed at the office during your follow-up visit. °8. ACTIVITIES:  You may resume regular daily activities (gradually increasing) beginning the next day.  Wearing a good support bra or sports bra minimizes pain and swelling.  You may have sexual intercourse when it is comfortable. °a. You may drive when you no longer are taking prescription pain medication, you can comfortably wear a seatbelt, and you can safely maneuver your car and apply brakes. °b. RETURN TO WORK:  ______________________________________________________________________________________ °9. You should see your doctor in the office for a follow-up appointment approximately two weeks after your surgery.  Your doctor’s nurse will typically make your follow-up appointment when she calls you with your pathology report.  Expect your pathology report 2-3 business days after your surgery.  You may call to check if you do not hear from us after three days. °10. OTHER INSTRUCTIONS: _______________________________________________________________________________________________ _____________________________________________________________________________________________________________________________________ °_____________________________________________________________________________________________________________________________________ °_____________________________________________________________________________________________________________________________________ ° °WHEN TO CALL YOUR DOCTOR: °1. Fever over 101.0 °2. Nausea and/or vomiting. °3. Extreme swelling or bruising. °4. Continued bleeding from incision. °5. Increased pain, redness, or drainage from the incision. ° °The clinic staff is available to answer your questions during regular business hours.  Please don’t hesitate to call and ask to speak to one of the nurses for clinical concerns.  If you have a medical emergency, go to the nearest  emergency room or call 911.  A surgeon from [facility] is always on call at the hospital. ° °For further questions, please visit [email or web address]  ° ° ° ° °  Post Anesthesia Home Care Instructions ° °Activity: °Get plenty of rest for the remainder of the day. A responsible individual must stay with you for 24 hours following the procedure.  °For the next 24 hours, DO NOT: °-Drive a car °-Operate machinery °-Drink alcoholic beverages °-Take any medication unless instructed by your physician °-Make any legal decisions or sign important papers. ° °Meals: °Start with liquid foods such as gelatin or soup. Progress to regular foods as tolerated. Avoid greasy, spicy, heavy foods. If nausea and/or vomiting occur, drink only clear liquids until the nausea and/or vomiting subsides. Call your physician if vomiting continues. ° °Special Instructions/Symptoms: °Your throat may feel dry or sore from the anesthesia or the breathing tube placed in your throat during surgery. If this causes discomfort, gargle with warm salt water. The discomfort should disappear within 24 hours. ° °If you had a scopolamine patch placed behind your ear for the management of post- operative nausea and/or vomiting: ° °1. The medication in the patch is effective for 72 hours, after which it should be removed.  Wrap patch in a tissue and discard in the trash. Wash hands thoroughly with soap and water. °2. You may remove the patch earlier than 72 hours if you experience unpleasant side effects which may include dry mouth, dizziness or visual disturbances. °3. Avoid touching the patch. Wash your hands with soap and water after contact with the patch. °  ° °

## 2019-04-20 NOTE — Transfer of Care (Signed)
Immediate Anesthesia Transfer of Care Note  Patient: Nicole Schwartz  Procedure(s) Performed: RIGHT BREAST LUMPECTOMY WITH RADIOACTIVE SEED LOCALIZATION (Right Breast)  Patient Location: PACU  Anesthesia Type:General  Level of Consciousness: drowsy and patient cooperative  Airway & Oxygen Therapy: Patient Spontanous Breathing and Patient connected to nasal cannula oxygen  Post-op Assessment: Report given to RN and Post -op Vital signs reviewed and stable  Post vital signs: Reviewed and stable  Last Vitals:  Vitals Value Taken Time  BP 84/56 04/20/2019  9:30 AM  Temp    Pulse 80 04/20/2019  9:31 AM  Resp 12 04/20/2019  9:31 AM  SpO2 98 % 04/20/2019  9:31 AM  Vitals shown include unvalidated device data.  Last Pain:  Vitals:   04/20/19 0705  TempSrc: Oral  PainSc: 0-No pain         Complications: No apparent anesthesia complications

## 2019-04-20 NOTE — Anesthesia Postprocedure Evaluation (Signed)
Anesthesia Post Note  Patient: Nicole Schwartz  Procedure(s) Performed: RIGHT BREAST LUMPECTOMY WITH RADIOACTIVE SEED LOCALIZATION (Right Breast)     Patient location during evaluation: PACU Anesthesia Type: General Level of consciousness: awake and alert Pain management: pain level controlled Vital Signs Assessment: post-procedure vital signs reviewed and stable Respiratory status: spontaneous breathing, nonlabored ventilation and respiratory function stable Cardiovascular status: blood pressure returned to baseline and stable Postop Assessment: no apparent nausea or vomiting Anesthetic complications: no    Last Vitals:  Vitals:   04/20/19 1015 04/20/19 1030  BP: 111/77 120/74  Pulse: 87 85  Resp: 13 (!) 8  Temp:    SpO2: 98% 96%    Last Pain:  Vitals:   04/20/19 1030  TempSrc:   PainSc: [place]

## 2019-04-20 NOTE — Op Note (Signed)
Preoperative diagnosis: Right breast ductal carcinoma in situ  Postoperative diagnosis: Same  Procedure: Right breast seed lumpectomy  Surgeon: Erroll Luna, MD  Anesthesia: LMA with local consisting of 0.25% Sensorcaine with epinephrine  EBL: Minimal  Specimen: Right breast tissue with seed and clip verified by Faxitron  Drains: None  IV fluids: Per anesthesia record  Indications for procedure: The patient is a 48 year old female who presents for right breast lumpectomy for DCIS.  Options of lumpectomy versus mastectomy were discussed preoperatively and she chose breast conservation understanding future treatment plans.The procedure has been discussed with the patient. Alternatives to surgery have been discussed with the patient.  Risks of surgery include bleeding,  Infection,  Seroma formation, death,  and the need for further surgery.   The patient understands and wishes to proceed.   Description of procedure: The patient was met in the holding area and questions were answered.  The right breast was marked as the correct side and neoprobe used to verify seed location.  She was taken back to the operating room.  She was placed supine upon the operating room table.  After induction of LMA anesthesia, the right breast was prepped and draped in sterile fashion timeout was performed.  Antibiotics were administered.  Neoprobe was used to identify the seed location in the right breast upper outer quadrant.  Curvilinear incision was made over the seed location.  Dissection was carried down and all tissue around the seed clip were excised with a grossly negative margin.  Hemostasis achieved.  The cavity was irrigated and found to be hemostatic.  Clips were placed and then closed with 3-0 Vicryl and 4-0 Monocryl.  Dermabond applied.  All final counts found to be correct.  Breast binder placed.  The patient was then awoke extubated taken to recovery in satisfactory condition.

## 2019-04-20 NOTE — Anesthesia Procedure Notes (Signed)
Procedure Name: LMA Insertion Date/Time: 04/20/2019 8:33 AM Performed by: Signe Colt, CRNA Pre-anesthesia Checklist: Patient identified, Emergency Drugs available, Suction available and Patient being monitored Patient Re-evaluated:Patient Re-evaluated prior to induction Oxygen Delivery Method: Circle system utilized Preoxygenation: Pre-oxygenation with 100% oxygen Induction Type: IV induction Ventilation: Mask ventilation without difficulty LMA: LMA inserted LMA Size: 4.0 Number of attempts: 1 Airway Equipment and Method: Bite block Placement Confirmation: positive ETCO2 Tube secured with: Tape Dental Injury: Teeth and Oropharynx as per pre-operative assessment

## 2019-04-21 ENCOUNTER — Encounter: Payer: Self-pay | Admitting: *Deleted

## 2019-04-21 ENCOUNTER — Telehealth: Payer: Self-pay | Admitting: Hematology and Oncology

## 2019-04-21 ENCOUNTER — Encounter (HOSPITAL_BASED_OUTPATIENT_CLINIC_OR_DEPARTMENT_OTHER): Payer: Self-pay | Admitting: Surgery

## 2019-04-21 NOTE — Telephone Encounter (Signed)
Called patient regarding upcoming Webex appointment, patient is notified and e-mail has been sent. °

## 2019-04-26 NOTE — Progress Notes (Signed)
HEMATOLOGY-ONCOLOGY [place] VISIT PROGRESS NOTE  I connected with Nicole Schwartz on 04/27/2019 at  2:45 PM EDT by Webex video conference and verified that I am speaking with the correct person using two identifiers.  I discussed the limitations, risks, security and privacy concerns of performing an evaluation and management service by Webex and the availability of in person appointments.  I also discussed with the patient that there may be a patient responsible charge related to this service. The patient expressed understanding and agreed to proceed.  Patient's Location: Home Physician Location: Clinic  CHIEF COMPLIANT: Follow-up s/p lumpectomy to review pathology and discuss further treatment  INTERVAL HISTORY: Nicole Schwartz is a 48 y.o. female with above-mentioned history of DCIS of the right breast. She underwent a lumpectomy on 04/20/19 with Dr. 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 She presents today over Webex to review the pathology report and discuss further treatment.     Malignant neoplasm of upper-outer quadrant of right breast in female, estrogen receptor positive ([place])   03/01/2019 Initial Diagnosis    Diagnostic mammogram detected 2.5cm calcifications in right breast. 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     03/09/2019 Cancer Staging    Staging form: Breast, AJCC 8th Edition - Clinical stage from 03/09/2019: Stage 0 (cTis (DCIS), cN0, cM0, ER+, PR+) - Signed by Gardenia Phlegm, NP on 03/09/2019    04/06/2019 Genetic Testing    Negative genetic testing on the common hereditary cancer panel.  The Common Hereditary Gene Panel offered by [facility] includes sequencing and/or deletion duplication testing of the following 48 genes: APC, ATM, AXIN2, BARD1, BMPR1A, BRCA1, BRCA2, BRIP1, CDH1, CDK4, CDKN2A (p14ARF), CDKN2A (p16INK4a), CHEK2, CTNNA1, DICER1, EPCAM (Deletion/duplication testing only),  GREM1 (promoter region deletion/duplication testing only), KIT, MEN1, MLH1, MSH2, MSH3, MSH6, MUTYH, NBN, NF1, NHTL1, PALB2, PDGFRA, PMS2, POLD1, POLE, PTEN, RAD50, RAD51C, RAD51D, RNF43, SDHB, SDHC, SDHD, SMAD4, SMARCA4. STK11, TP53, TSC1, TSC2, and VHL.  The following genes were evaluated for sequence changes only: SDHA and HOXB13 c.251G>A variant only. The report date is Apr 06, 2019.    04/20/2019 Surgery    Right lumpectomy (Cornett): DCIS, 1.5cm, intermediate grade, ER+ 95%, PR+ 95%, clear margins.      REVIEW OF SYSTEMS:   Constitutional: Denies fevers, chills or abnormal weight loss Eyes: Denies blurriness of vision Ears, nose, mouth, throat, and face: Denies mucositis or sore throat Respiratory: Denies cough, dyspnea or wheezes Cardiovascular: Denies palpitation, chest discomfort Gastrointestinal:  Denies nausea, heartburn or change in bowel habits Skin: Denies abnormal skin rashes Lymphatics: Denies new lymphadenopathy or easy bruising Neurological:Denies numbness, tingling or new weaknesses Behavioral/Psych: Mood is stable, no new changes  Extremities: No lower extremity edema Breast: denies any pain or lumps or nodules in either breasts All other systems were reviewed with the patient and are negative.  Observations/Objective:  There were no vitals filed for this visit. There is no height or weight on file to calculate BMI.  I have reviewed the data as listed CMP Latest Ref Rng & Units 04/15/2019 02/15/2019 10/18/2018  Glucose 70 - 99 mg/dL 110(H) 108(H) 104(H)  BUN 6 - 20 mg/dL '10 14 13  ' Creatinine 0.44 - 1.00 mg/dL 0.78 0.68 0.73  Sodium 135 - 145 mmol/L 137 139 136  Potassium 3.5 - 5.1 mmol/L 3.5 3.7 3.7  Chloride 98 - 111 mmol/L 99 103 100  CO2 22 - 32 mmol/L '26 28 26  ' Calcium  8.9 - 10.3 mg/dL 9.7 9.7 9.9  Total Protein 6.0 - 8.3 g/dL - 7.0 7.6  Total Bilirubin 0.2 - 1.2 mg/dL - 0.4 0.6  Alkaline Phos 39 - 117 U/L - 55 53  AST 0 - 37 U/L - 14 15  ALT 0 - 35 U/L - 16  17    Lab Results  Component Value Date   WBC 5.4 04/15/2019   HGB 14.3 04/15/2019   HCT 41.6 04/15/2019   MCV 86.1 04/15/2019   PLT 281 04/15/2019      Assessment Plan:  Malignant neoplasm of upper-outer quadrant of right breast in female, estrogen receptor positive ([place]) Antiestrogen treatment plan3/31/2020:Diagnostic mammogram detected 2.5cm calcifications in right breast. 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  I counseled her regarding the pathology report and email her a copy of this report.  Treatment plan: 1. Adjuvant radiation therapy 2. Followed by antiestrogen therapy with tamoxifen 5 years  Return to clinic at the end of radiation to discuss antiestrogen treatment plan  I discussed the assessment and treatment plan with the patient. The patient was provided an opportunity to ask questions and all were answered. The patient agreed with the plan and demonstrated an understanding of the instructions. The patient was advised to call back or seek an in-person evaluation if the symptoms worsen or if the condition fails to improve as anticipated.   I provided 15 minutes of face-to-face Web Ex time during this encounter.    Rulon Eisenmenger, MD 04/27/2019   I, Cloyde Reams Dorshimer, am acting as scribe for Nicholas Lose, MD.  I have reviewed the above documentation for accuracy and completeness, and I agree with the above.

## 2019-04-27 ENCOUNTER — Inpatient Hospital Stay (HOSPITAL_BASED_OUTPATIENT_CLINIC_OR_DEPARTMENT_OTHER): Payer: 59 | Admitting: Hematology and Oncology

## 2019-04-27 DIAGNOSIS — C50411 Malignant neoplasm of upper-outer quadrant of right female breast: Secondary | ICD-10-CM | POA: Diagnosis not present

## 2019-04-27 DIAGNOSIS — Z923 Personal history of irradiation: Secondary | ICD-10-CM | POA: Diagnosis not present

## 2019-04-27 DIAGNOSIS — Z170 Estrogen receptor positive status [ER+]: Secondary | ICD-10-CM | POA: Diagnosis not present

## 2019-04-27 DIAGNOSIS — Z79810 Long term (current) use of selective estrogen receptor modulators (SERMs): Secondary | ICD-10-CM | POA: Diagnosis not present

## 2019-04-27 NOTE — Assessment & Plan Note (Addendum)
Antiestrogen treatment plan3/31/2020:Diagnostic mammogram detected 2.5cm calcifications in right breast. 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   Treatment plan: 1. Adjuvant radiation therapy 2. Followed by antiestrogen therapy with tamoxifen 5 years  Return to clinic at the end of radiation to discuss antiestrogen treatment plan

## 2019-04-29 ENCOUNTER — Ambulatory Visit: Payer: 59 | Admitting: Endocrinology

## 2019-05-04 NOTE — Progress Notes (Signed)
Location of Breast Cancer: Malignant neoplasm of upper outer quadrant of right breast- DCIS  SIM the week of June 15th, start the week of June 22nd.  Staging form: Breast, AJCC 8th Edition - Clinical stage from 03/09/2019: Stage 0 (cTis (DCIS), cN0, cM0, ER+, PR+) - Signed by Gardenia Phlegm, NP on 03/09/2019  Did patient present with symptoms (if so, please note symptoms) or was this found on screening mammography?: Mammogram detected 2.5 cm calcifications in the right breast.  Diagnostic Mammogram 02/22/2019: In the lateral right breast, middle to posterior depth there is a 2.5 cm group of punctate calcifications.  Right Lumpectomy 04/20/2019    Histology per Pathology Report: Right Breast 02/24/2019  Receptor Status: ER(95% +), PR (95% +), Her2-neu (), Ki-()   Past/Anticipated interventions by surgeon, if any: Dr. Brantley Stage -Lumpectomy 04/20/2019: DCIS, clear margins  Past/Anticipated interventions by medical oncology, if any:  Dr. Lindi Adie 04/27/2019 -Treatment Plan: Adjuvant radiation therapy followed by antiestrogen therapy with tamoxifen x 5 years. -Return to clinic at the end of radiation to discuss antiestrogen treatment plan.   Lymphedema issues, if any:  No  Pain issues, if any:  No  SAFETY ISSUES:  Prior radiation? No  Pacemaker/ICD? No  Possible current pregnancy? No  Is the patient on methotrexate? No  Current Complaints / other details:   -Genetic Testing 04/06/2019: Negative    Cori Razor, RN 05/04/2019,10:22 AM

## 2019-05-05 ENCOUNTER — Ambulatory Visit
Admission: RE | Admit: 2019-05-05 | Discharge: 2019-05-05 | Disposition: A | Discharge status: Home / Self Care | Payer: 59 | Source: Ambulatory Visit | Attending: Radiation Oncology | Admitting: Radiation Oncology

## 2019-05-05 ENCOUNTER — Telehealth: Payer: 59 | Admitting: Radiation Oncology

## 2019-05-05 ENCOUNTER — Institutional Professional Consult (permissible substitution): Payer: 59 | Admitting: Radiation Oncology

## 2019-05-05 ENCOUNTER — Ambulatory Visit: Payer: 59 | Admitting: Hematology and Oncology

## 2019-05-05 ENCOUNTER — Other Ambulatory Visit: Payer: Self-pay

## 2019-05-05 ENCOUNTER — Encounter: Payer: Self-pay | Admitting: Radiation Oncology

## 2019-05-05 ENCOUNTER — Telehealth: Payer: 59

## 2019-05-05 VITALS — Ht 61.5 in | Wt 247.0 lb

## 2019-05-05 DIAGNOSIS — C50411 Malignant neoplasm of upper-outer quadrant of right female breast: Secondary | ICD-10-CM

## 2019-05-05 DIAGNOSIS — Z170 Estrogen receptor positive status [ER+]: Secondary | ICD-10-CM

## 2019-05-05 NOTE — Progress Notes (Signed)
Radiation Oncology         [phone]) [phone] ________________________________  Name: Nicole Schwartz        MRN: [id number]  Date of Service: 05/05/2019 DOB: June 09, 1971  GX:QJJHER, Barth Kirks, PA-C  Erroll Luna, MD     REFERRING PHYSICIAN: Erroll Luna, MD   DIAGNOSIS: The encounter diagnosis was Malignant neoplasm of upper-outer quadrant of right breast in female, estrogen receptor positive ([place]).   HISTORY OF PRESENT ILLNESS: Nicole Schwartz is a 48 y.o. female seen for a new diagnosis of right breast cancer. The patient was noted to have calcifications in the right breast on screening mammography. Diagnostic imaging revealed a group of calcifications measuring 25 cm, and on 02/24/2019 a biopsy revealed intermediate grade DCIS. Her cancer was ER/PR positive. She was counseled on lumpectomy and underwent this procedure on  04/20/2019. Final pathology revealed Intermediate grade carcinoma measuring 1.5 cm.  Superior posterior margin less than 1 mm.  She comes today to discuss adjuvant radiotherapy.    PREVIOUS RADIATION THERAPY: No   PAST MEDICAL HISTORY:  Past Medical History:  Diagnosis Date   Arthritis    RA   Family history of breast cancer    Hypertension    Hypothyroidism        PAST SURGICAL HISTORY: Past Surgical History:  Procedure Laterality Date   BREAST LUMPECTOMY WITH RADIOACTIVE SEED LOCALIZATION Right 04/20/2019   Procedure: RIGHT BREAST LUMPECTOMY WITH RADIOACTIVE SEED LOCALIZATION;  Surgeon: Erroll Luna, MD;  Location: [place];  Service: General;  Laterality: Right;   CESAREAN SECTION     x2     FAMILY HISTORY:  Family History  Problem Relation Age of Onset   Thyroid disease Mother    Diabetes Maternal Grandfather    Hypertension Maternal Grandfather    Diabetes Maternal Grandmother    Breast cancer Paternal Grandmother      SOCIAL HISTORY:  reports that she has never smoked. She has never used smokeless  tobacco. She reports previous alcohol use. She reports that she does not use drugs. 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    ALLERGIES: Patient has no known allergies.   MEDICATIONS:  Current Outpatient Medications  Medication Sig Dispense Refill   diclofenac (VOLTAREN) 75 MG EC tablet Take 75 mg by mouth 2 (two) times daily.  2   HYDROcodone-acetaminophen (NORCO/VICODIN) 5-325 MG tablet Take 1 tablet by mouth every 6 (six) hours as needed for moderate pain. 15 tablet 0   hydroxychloroquine (PLAQUENIL) 200 MG tablet TAKE 1 TABLET BY MOUTH TWICE A DAY WITH FOOD OR MILK  2   ibuprofen (ADVIL) 800 MG tablet Take 1 tablet (800 mg total) by mouth every 8 (eight) hours as needed. 30 tablet 0   lisinopril-hydrochlorothiazide (PRINZIDE,ZESTORETIC) 10-12.5 MG tablet TAKE 1 TABLET BY MOUTH DAILY. 90 tablet 1   metFORMIN (GLUCOPHAGE-XR) 750 MG 24 hr tablet Take 2 tablets daily (Patient taking differently: Take 750 mg by mouth daily with breakfast. TAKE 1 TABLET BY MOUTH ONCE DAILY.) 180 tablet 2   SYNTHROID 75 MCG tablet TAKE 1 TABLET BY MOUTH DAILY BEFORE BREAKFAST 90 tablet 2   No current facility-administered medications for this encounter.      REVIEW OF SYSTEMS: On review of systems, the patient reports that she is doing well overall. She denies any chest pain, shortness of breath, cough, fevers, chills, night sweats, unintended weight changes. She denies any bowel or bladder disturbances, and denies abdominal pain,  or vomiting. She denies any new musculoskeletal or joint aches or pains. A complete review of systems is obtained and is otherwise negative. ° °  ° °PHYSICAL EXAM:  °Wt Readings from Last 3 Encounters:  °04/20/19 250 lb 7.1 oz (113.6 kg)  °02/18/19 257 lb 3.2 oz (116.7 kg)  °10/22/18 252 lb (114.3 kg)  ° °Temp Readings from Last 3 Encounters:  °04/20/19 98.3 °F (36.8 °C)  °02/18/19 98.7 °F (37.1 °C) (Oral)  °02/06/16 98.3 °F (36.8 °C)   ° °BP Readings from Last 3 Encounters:  °04/20/19 125/79  °02/18/19 122/76  °10/22/18 118/72  ° °Pulse Readings from Last 3 Encounters:  °04/20/19 97  °02/18/19 93  °10/22/18 86  ° ° ° °In general this is a well appearing caucasian female in no acute distress. She's alert and oriented x4 and appropriate throughout the examination. Cardiopulmonary assessment is negative for acute distress and she exhibits normal effort. Breast exam is deferred. ° ° ° °ECOG = 0 ° °0 - Asymptomatic (Fully active, able to carry on all predisease activities without restriction) ° °1 - Symptomatic but completely ambulatory (Restricted in physically strenuous activity but ambulatory and able to carry out work of a light or sedentary nature. For example, light housework, office work) ° °2 - Symptomatic, <50% in bed during the day (Ambulatory and capable of all self care but unable to carry out any work activities. Up and about more than 50% of waking hours) ° °3 - Symptomatic, >50% in bed, but not bedbound (Capable of only limited self-care, confined to bed or chair 50% or more of waking hours) ° °4 - Bedbound (Completely disabled. Cannot carry on any self-care. Totally confined to bed or chair) ° °5 - Death ° ° Oken MM, Creech RH, Tormey DC, et al. (1982). "Toxicity and response criteria of the Eastern Cooperative Oncology Group". Am. J. Clin. Oncol. 5 (6): 649-55 ° ° ° °LABORATORY DATA:  °Lab Results  °Component Value Date  ° WBC 5.4 04/15/2019  ° HGB 14.3 04/15/2019  ° HCT 41.6 04/15/2019  ° MCV 86.1 04/15/2019  ° PLT 281 04/15/2019  ° °Lab Results  °Component Value Date  ° NA 137 04/15/2019  ° K 3.5 04/15/2019  ° CL 99 04/15/2019  ° CO2 26 04/15/2019  ° °Lab Results  °Component Value Date  ° ALT 16 02/15/2019  ° AST 14 02/15/2019  ° ALKPHOS 55 02/15/2019  ° BILITOT 0.4 02/15/2019  ° °  ° °RADIOGRAPHY: Mm Breast Surgical Specimen ° °Result Date: 04/20/2019 °CLINICAL DATA:  Status post surgical excision following radioactive seed  localization of a right breast carcinoma. EXAM: SPECIMEN RADIOGRAPH OF THE RIGHT BREAST COMPARISON:  Previous exam(s). FINDINGS: Status post excision of the right breast. The radioactive seed and biopsy marker clip are present, completely intact, and were marked for pathology. IMPRESSION: Specimen radiograph of the right breast. Electronically Signed   By: David  Ormond M.D.   On: 04/20/2019 09:34  ° °Mm Rt Radioactive Seed Loc Mammo Guide ° °Result Date: 04/18/2019 °CLINICAL DATA:  Recently diagnosed RIGHT breast DCIS scheduled for breast conservation surgery requiring preoperative radioactive seed localization. EXAM: MAMMOGRAPHIC GUIDED RADIOACTIVE SEED LOCALIZATION OF THE RIGHT BREAST COMPARISON:  Previous exam(s). FINDINGS: Patient presents for radioactive seed localization prior to breast conservation surgery. I met with the patient and we discussed the procedure of seed localization including benefits and alternatives. We discussed the high likelihood of a successful procedure. We discussed the risks of the procedure including infection, bleeding, tissue injury   and further surgery. We discussed the low dose of radioactivity involved in the procedure. Informed, written consent was given. The usual time-out protocol was performed immediately prior to the procedure. Using mammographic guidance, sterile technique, 1% lidocaine and an I-125 radioactive seed, the residual calcifications within the RIGHT breast, approximately 1 cm below the level of the coil shaped biopsy clip, was localized using a lateral approach. The follow-up mammogram images confirm the seed in the expected location and were marked for Dr. Cornett. Follow-up survey of the patient confirms presence of the radioactive seed. Order number of I-125 seed:  [id number]. Total activity:  0.252 millicurie reference Date: 03/14/2019 The patient tolerated the procedure well and was released from the [facility]. She was given instructions regarding seed  removal. IMPRESSION: Radioactive seed localization right breast. No apparent complications. Electronically Signed   By: Stan  Maynard M.D.   On: 04/18/2019 10:01  ° °   ° °IMPRESSION/PLAN: °1. ER/PR positive DCIS of the right breast. Dr. Moody discusses the pathology findings and reviews the nature of DCIS and the recommendations for adjuvant radiotherapy to reduce the risk of local recurrence. We discussed the risks, benefits, short, and long term effects of radiotherapy, and the patient is interested in proceeding. Dr. Moody discusses the delivery and logistics of radiotherapy and anticipates a course of 6 1/2 weeks of radiotherapy. We will have our simulation staff contact her to proceed with simulation in about 2 weeks time. She is in agreement with this plan. ° °This encounter was provided by telemedicine platform Mychart.  °The patient has given verbal consent for this type of encounter and has been advised to only accept a meeting of this type in a secure network environment. °The time spent during this encounter was 60 minutes. °The attendants for this meeting include Latoya Silva, RN, Dr. Moody, Alison Claire Perkins  and Crislyn Nichole Devoss.  °During the encounter,  Latoya Silva, RN, and Dr. Moody  were located at [facility] Radiation Oncology Department. °Lalana Nichole Packham was located at home. °I was also remotely working from home. ° ° °The above documentation reflects my direct findings during this shared patient visit. Please see the separate note by Dr. Moody on this date for the remainder of the patient's plan of care. ° ° ° °Alison C. Perkins, PAC ° °

## 2019-05-20 ENCOUNTER — Ambulatory Visit
Admission: RE | Admit: 2019-05-20 | Discharge: 2019-05-20 | Disposition: A | Discharge status: Home / Self Care | Payer: 59 | Source: Ambulatory Visit | Attending: Radiation Oncology | Admitting: Radiation Oncology

## 2019-05-20 ENCOUNTER — Other Ambulatory Visit: Payer: Self-pay

## 2019-05-20 DIAGNOSIS — C50411 Malignant neoplasm of upper-outer quadrant of right female breast: Secondary | ICD-10-CM | POA: Insufficient documentation

## 2019-05-20 DIAGNOSIS — Z510 Encounter for antineoplastic radiation therapy: Secondary | ICD-10-CM | POA: Diagnosis not present

## 2019-05-20 DIAGNOSIS — Z170 Estrogen receptor positive status [ER+]: Secondary | ICD-10-CM | POA: Diagnosis not present

## 2019-05-23 ENCOUNTER — Other Ambulatory Visit: Payer: Self-pay | Admitting: Endocrinology

## 2019-05-24 ENCOUNTER — Telehealth: Payer: Self-pay | Admitting: Hematology and Oncology

## 2019-05-24 NOTE — Telephone Encounter (Signed)
Scheduled appt per 6/22sch message - f/u on 8/10 - reminder letter mailed with appt date and time

## 2019-05-25 ENCOUNTER — Other Ambulatory Visit: Payer: Self-pay | Admitting: Endocrinology

## 2019-05-26 ENCOUNTER — Telehealth: Payer: Self-pay | Admitting: Endocrinology

## 2019-05-26 ENCOUNTER — Other Ambulatory Visit: Payer: Self-pay

## 2019-05-26 DIAGNOSIS — E063 Autoimmune thyroiditis: Secondary | ICD-10-CM

## 2019-05-26 MED ORDER — SYNTHROID 75 MCG PO TABS: 75.0000 ug | ORAL_TABLET | Freq: Every day | ORAL | 0 refills | Status: DC

## 2019-05-26 NOTE — Telephone Encounter (Signed)
SYNTHROID 75 MCG tablet 30 tablet 0 05/26/2019    Sig - Route: Take 1 tablet (75 mcg total) by mouth daily. - Oral   Sent to pharmacy as: SYNTHROID 75 MCG tablet   E-Prescribing Status: Receipt confirmed by pharmacy (05/26/2019 2:26 PM EDT)

## 2019-05-26 NOTE — Telephone Encounter (Signed)
MEDICATION: SYNTHROID 75 MCG tablet  PHARMACY:   [id number] [place], [place] - [address] [phone] (Phone) [phone] (Fax)     IS THIS A 90 DAY SUPPLY : Yes if possible  IS PATIENT OUT OF MEDICATION:  No  IF NOT; HOW MUCH IS LEFT: 3 tablets  LAST APPOINTMENT DATE: @6 /24/2020  NEXT APPOINTMENT DATE:@8 /06/2019  DO WE HAVE YOUR PERMISSION TO LEAVE A DETAILED MESSAGE:Yes  OTHER COMMENTS:    **Let patient know to contact pharmacy at the end of the day to make sure medication is ready. **  ** Please notify patient to allow 48-72 hours to process**  **Encourage patient to contact the pharmacy for refills or they can request refills through [facility]**

## 2019-05-27 DIAGNOSIS — C50411 Malignant neoplasm of upper-outer quadrant of right female breast: Secondary | ICD-10-CM | POA: Diagnosis not present

## 2019-05-30 ENCOUNTER — Other Ambulatory Visit: Payer: Self-pay

## 2019-05-30 ENCOUNTER — Ambulatory Visit
Admission: RE | Admit: 2019-05-30 | Discharge: 2019-05-30 | Disposition: A | Discharge status: Home / Self Care | Payer: 59 | Source: Ambulatory Visit | Attending: Radiation Oncology | Admitting: Radiation Oncology

## 2019-05-30 DIAGNOSIS — C50411 Malignant neoplasm of upper-outer quadrant of right female breast: Secondary | ICD-10-CM | POA: Diagnosis not present

## 2019-05-31 ENCOUNTER — Ambulatory Visit
Admission: RE | Admit: 2019-05-31 | Discharge: 2019-05-31 | Disposition: A | Discharge status: Home / Self Care | Payer: 59 | Source: Ambulatory Visit | Attending: Radiation Oncology | Admitting: Radiation Oncology

## 2019-05-31 ENCOUNTER — Other Ambulatory Visit: Payer: Self-pay

## 2019-05-31 DIAGNOSIS — C50411 Malignant neoplasm of upper-outer quadrant of right female breast: Secondary | ICD-10-CM | POA: Diagnosis not present

## 2019-06-01 ENCOUNTER — Other Ambulatory Visit: Payer: Self-pay

## 2019-06-01 ENCOUNTER — Ambulatory Visit
Admission: RE | Admit: 2019-06-01 | Discharge: 2019-06-01 | Disposition: A | Discharge status: Home / Self Care | Payer: 59 | Source: Ambulatory Visit | Attending: Radiation Oncology | Admitting: Radiation Oncology

## 2019-06-01 DIAGNOSIS — Z510 Encounter for antineoplastic radiation therapy: Secondary | ICD-10-CM | POA: Diagnosis not present

## 2019-06-01 DIAGNOSIS — Z170 Estrogen receptor positive status [ER+]: Secondary | ICD-10-CM | POA: Insufficient documentation

## 2019-06-01 DIAGNOSIS — C50411 Malignant neoplasm of upper-outer quadrant of right female breast: Secondary | ICD-10-CM | POA: Diagnosis not present

## 2019-06-02 ENCOUNTER — Other Ambulatory Visit: Payer: Self-pay

## 2019-06-02 ENCOUNTER — Ambulatory Visit
Admission: RE | Admit: 2019-06-02 | Discharge: 2019-06-02 | Disposition: A | Discharge status: Home / Self Care | Payer: 59 | Source: Ambulatory Visit | Attending: Radiation Oncology | Admitting: Radiation Oncology

## 2019-06-02 DIAGNOSIS — C50411 Malignant neoplasm of upper-outer quadrant of right female breast: Secondary | ICD-10-CM

## 2019-06-02 DIAGNOSIS — Z170 Estrogen receptor positive status [ER+]: Secondary | ICD-10-CM

## 2019-06-02 MED ORDER — RADIAPLEXRX EX GEL: Freq: Once | CUTANEOUS | Status: DC

## 2019-06-02 MED ORDER — ALRA NON-METALLIC DEODORANT (RAD-ONC): 1.0000 "application " | Freq: Once | TOPICAL | Status: DC

## 2019-06-06 ENCOUNTER — Ambulatory Visit
Admission: RE | Admit: 2019-06-06 | Discharge: 2019-06-06 | Disposition: A | Discharge status: Home / Self Care | Payer: 59 | Source: Ambulatory Visit | Attending: Radiation Oncology | Admitting: Radiation Oncology

## 2019-06-06 ENCOUNTER — Ambulatory Visit: Payer: 59 | Admitting: Endocrinology

## 2019-06-06 ENCOUNTER — Other Ambulatory Visit: Payer: Self-pay

## 2019-06-06 DIAGNOSIS — C50411 Malignant neoplasm of upper-outer quadrant of right female breast: Secondary | ICD-10-CM | POA: Diagnosis not present

## 2019-06-07 ENCOUNTER — Other Ambulatory Visit: Payer: Self-pay

## 2019-06-07 ENCOUNTER — Ambulatory Visit
Admission: RE | Admit: 2019-06-07 | Discharge: 2019-06-07 | Disposition: A | Discharge status: Home / Self Care | Payer: 59 | Source: Ambulatory Visit | Attending: Radiation Oncology | Admitting: Radiation Oncology

## 2019-06-07 DIAGNOSIS — C50411 Malignant neoplasm of upper-outer quadrant of right female breast: Secondary | ICD-10-CM | POA: Diagnosis not present

## 2019-06-08 ENCOUNTER — Ambulatory Visit
Admission: RE | Admit: 2019-06-08 | Discharge: 2019-06-08 | Disposition: A | Discharge status: Home / Self Care | Payer: 59 | Source: Ambulatory Visit | Attending: Radiation Oncology | Admitting: Radiation Oncology

## 2019-06-08 ENCOUNTER — Other Ambulatory Visit: Payer: Self-pay

## 2019-06-08 DIAGNOSIS — C50411 Malignant neoplasm of upper-outer quadrant of right female breast: Secondary | ICD-10-CM | POA: Diagnosis not present

## 2019-06-09 ENCOUNTER — Ambulatory Visit
Admission: RE | Admit: 2019-06-09 | Discharge: 2019-06-09 | Disposition: A | Discharge status: Home / Self Care | Payer: 59 | Source: Ambulatory Visit | Attending: Radiation Oncology | Admitting: Radiation Oncology

## 2019-06-09 ENCOUNTER — Other Ambulatory Visit: Payer: Self-pay

## 2019-06-09 DIAGNOSIS — C50411 Malignant neoplasm of upper-outer quadrant of right female breast: Secondary | ICD-10-CM | POA: Diagnosis not present

## 2019-06-10 ENCOUNTER — Ambulatory Visit
Admission: RE | Admit: 2019-06-10 | Discharge: 2019-06-10 | Disposition: A | Discharge status: Home / Self Care | Payer: 59 | Source: Ambulatory Visit | Attending: Radiation Oncology | Admitting: Radiation Oncology

## 2019-06-10 ENCOUNTER — Other Ambulatory Visit: Payer: Self-pay

## 2019-06-10 DIAGNOSIS — C50411 Malignant neoplasm of upper-outer quadrant of right female breast: Secondary | ICD-10-CM | POA: Diagnosis not present

## 2019-06-13 ENCOUNTER — Ambulatory Visit (INDEPENDENT_AMBULATORY_CARE_PROVIDER_SITE_OTHER): Payer: 59 | Admitting: Endocrinology

## 2019-06-13 ENCOUNTER — Other Ambulatory Visit: Payer: Self-pay

## 2019-06-13 ENCOUNTER — Ambulatory Visit
Admission: RE | Admit: 2019-06-13 | Discharge: 2019-06-13 | Disposition: A | Discharge status: Home / Self Care | Payer: 59 | Source: Ambulatory Visit | Attending: Radiation Oncology | Admitting: Radiation Oncology

## 2019-06-13 DIAGNOSIS — C50411 Malignant neoplasm of upper-outer quadrant of right female breast: Secondary | ICD-10-CM | POA: Diagnosis not present

## 2019-06-13 DIAGNOSIS — E782 Mixed hyperlipidemia: Secondary | ICD-10-CM

## 2019-06-13 DIAGNOSIS — E063 Autoimmune thyroiditis: Secondary | ICD-10-CM | POA: Diagnosis not present

## 2019-06-13 NOTE — Progress Notes (Addendum)
Patient ID: Nicole Schwartz, female   DOB: 08/24/1971, 48 y.o.   MRN: [id number]    Chief complaint: Followup of various problems  Today's office visit was provided via telemedicine using video technique The patient was explained the limitations of evaluation and management by telemedicine and the availability of in person appointments.  The patient understood the limitations and agreed to proceed. Patient also understood that the telehealth visit is billable. . Location of the patient: Patient's home . Location of the provider: Physician office Only the patient and myself were participating in the encounter    History of Present Illness:  OBESITY:    In 8/16 she was started on Belviq and with this she had previously lost over 20 pounds with baseline weight of 258 However her insurance does not cover this or any other weight loss medications including Saxenda Did not tolerate low-dose phentermine  Her weight was increased further in 01/2019 and she was recommended Contrave She was also asked to plan her meals better, eliminate junk food and increase exercise  However she did not get the prescription for Contrave filled She also currently is being treated for breast cancer and she has not been focusing on her weight However with her radiation diet is decreased and weight is down 10 pounds She has not done much exercise because of fatigue   Wt Readings from Last 3 Encounters:  05/05/19 247 lb (112 kg)  04/20/19 250 lb 7.1 oz (113.6 kg)  02/18/19 257 lb 3.2 oz (116.7 kg)    HYPERTENSION: She has had hypertension since at least 2004. Has been on ACE inhibitor since about 2005 Because of swelling of her legs her ACE inhibitor was changed to low-dose Lotensin HCTZ and subsequently to hold lisinopril HCT 10/12.5  Blood pressure consistently well-controlled, she has checked her readings at home and these are usually around 782 systolic or lower and 95A diastolic  BP  Readings from Last 3 Encounters:  04/20/19 125/79  02/18/19 122/76  10/22/18 118/72      IMPAIRED FASTING GLUCOSE: Her glucose is now 108 fasting and has been as high as 113  She does try to watch her diet although not consistently watching saturated fats She has been  on metformin ER 750 mg, taking 1 tablet twice daily   A1c has been quite normal below the pre-diabetic range consistently   Lab Results  Component Value Date   HGBA1C 5.3 10/18/2018   HGBA1C 5.3 01/18/2018   HGBA1C 5.2 05/14/2017   Lab Results  Component Value Date   LDLCALC 98 06/10/2018   CREATININE 0.78 04/15/2019    HYPOTHYROIDISM:  She has had a goiter since at least 1998. Baseline TSH was 5.4 when she was started on levothyroxine in 2000 and has required small doses of levothyroxine Has not required a change in her 75 g dose for the last few years TSH on the last visit was normal   Lab Results  Component Value Date   TSH 1.77 02/15/2019   TSH 2.11 10/18/2018   TSH 1.89 01/18/2018   FREET4 1.07 01/31/2016   FREET4 0.81 03/07/2014   FREET4 0.84 09/09/2013     HYPERLIPIDEMIA: She has had diabetic dyslipidemia with HDL as low as 23 and high triglycerides. Previously on Crestor and fenofibrate combination  Since LDL was relatively higher she was started back on Crestor and this was increased in 2/19 up to 10 mg since LDL was 117  Tends to have persistently high triglycerides  Lab Results  Component Value Date   CHOL 189 02/15/2019   CHOL 183 10/18/2018   CHOL 179 06/10/2018   Lab Results  Component Value Date   HDL 40.60 02/15/2019   HDL 38.80 (L) 10/18/2018   HDL 42.40 06/10/2018   Lab Results  Component Value Date   LDLCALC 98 06/10/2018   LDLCALC 105 (H) 05/14/2017   LDLCALC 92 10/06/2016   Lab Results  Component Value Date   TRIG 217.0 (H) 02/15/2019   TRIG 230.0 (H) 10/18/2018   TRIG 195.0 (H) 06/10/2018   Lab Results  Component Value Date   CHOLHDL 5 02/15/2019    CHOLHDL 5 10/18/2018   CHOLHDL 4 06/10/2018   Lab Results  Component Value Date   LDLDIRECT 113.0 02/15/2019   LDLDIRECT 122.0 10/18/2018   LDLDIRECT 119.0 01/18/2018      Allergies as of 06/13/2019   No Known Allergies     Medication List       Accurate as of June 13, 2019  3:52 PM. If you have any questions, ask your nurse or doctor.        diclofenac 75 MG EC tablet Commonly known as: VOLTAREN Take 75 mg by mouth 2 (two) times daily.   HYDROcodone-acetaminophen 5-325 MG tablet Commonly known as: NORCO/VICODIN Take 1 tablet by mouth every 6 (six) hours as needed for moderate pain.   hydroxychloroquine 200 MG tablet Commonly known as: PLAQUENIL TAKE 1 TABLET BY MOUTH TWICE A DAY WITH FOOD OR MILK   ibuprofen 800 MG tablet Commonly known as: ADVIL Take 1 tablet (800 mg total) by mouth every 8 (eight) hours as needed.   lisinopril-hydrochlorothiazide 10-12.5 MG tablet Commonly known as: ZESTORETIC TAKE 1 TABLET BY MOUTH DAILY.   metFORMIN 750 MG 24 hr tablet Commonly known as: GLUCOPHAGE-XR Take 2 tablets daily What changed:   how much to take  how to take this  when to take this  additional instructions   Synthroid 75 MCG tablet Generic drug: levothyroxine Take 1 tablet (75 mcg total) by mouth daily.       Allergies: No Known Allergies  Past Medical History:  Diagnosis Date  . Arthritis    RA  . Family history of breast cancer   . Hypertension   . Hypothyroidism     Past Surgical History:  Procedure Laterality Date  . BREAST LUMPECTOMY WITH RADIOACTIVE SEED LOCALIZATION Right 04/20/2019   Procedure: RIGHT BREAST LUMPECTOMY WITH RADIOACTIVE SEED LOCALIZATION;  Surgeon: Erroll Luna, MD;  Location: [facility];  Service: General;  Laterality: Right;  . CESAREAN SECTION     x2    Family History  Problem Relation Age of Onset  . Thyroid disease Mother   . Diabetes Maternal Grandfather   . Hypertension Maternal  Grandfather   . Diabetes Maternal Grandmother   . Breast cancer Paternal Grandmother     Social History:  reports that she has never smoked. She has never used smokeless tobacco. She reports previous alcohol use. She reports that she does not use drugs.  Review of Systems   She has been told to have rheumatoid arthritis, On Plaquenil   History of PCOS: Her menstrual cycles are regular Free testosterone last normal  Labs:  No visits with results within 1 Week(s) from this visit.  Latest known visit with results is:  Admission on 04/20/2019, Discharged on 04/20/2019  Component Date Value Ref Range Status  . Sodium 04/15/2019 137  135 - 145 mmol/L Final  .  Potassium 04/15/2019 3.5  3.5 - 5.1 mmol/L Final  . Chloride 04/15/2019 99  98 - 111 mmol/L Final  . CO2 04/15/2019 26  22 - 32 mmol/L Final  . Glucose, Bld 04/15/2019 110* 70 - 99 mg/dL Final  . BUN 04/15/2019 10  6 - 20 mg/dL Final  . Creatinine, Ser 04/15/2019 0.78  0.44 - 1.00 mg/dL Final  . Calcium 04/15/2019 9.7  8.9 - 10.3 mg/dL Final  . GFR calc non Af Amer 04/15/2019 >60  >60 mL/min Final  . GFR calc Af Amer 04/15/2019 >60  >60 mL/min Final  . Anion gap 04/15/2019 12  5 - 15 Final   Performed at [facility], [place] [address], [place], [place] [address]  . WBC 04/15/2019 5.4  4.0 - 10.5 K/uL Final  . RBC 04/15/2019 4.83  3.87 - 5.11 MIL/uL Final  . Hemoglobin 04/15/2019 14.3  12.0 - 15.0 g/dL Final  . HCT 04/15/2019 41.6  36.0 - 46.0 % Final  . MCV 04/15/2019 86.1  80.0 - 100.0 fL Final  . MCH 04/15/2019 29.6  26.0 - 34.0 pg Final  . MCHC 04/15/2019 34.4  30.0 - 36.0 g/dL Final  . RDW 04/15/2019 12.3  11.5 - 15.5 % Final  . Platelets 04/15/2019 281  150 - 400 K/uL Final  . nRBC 04/15/2019 0.0  0.0 - 0.2 % Final   Performed at [facility], [place] [address] [place], [place] [address]  . Preg Test, Ur 04/20/2019 NEGATIVE  NEGATIVE Final   Comment:        THE SENSITIVITY OF THIS METHODOLOGY IS >24 mIU/mL    . Glucose-Capillary 04/20/2019 92  70 - 99 mg/dL Final      EXAM:  There were no vitals taken for this visit.     Assessment/Plan:   OBESITY:  Currently she is losing weight from decreased appetite related to her treatment for breast cancer Will defer any weight loss medications at this time However encouraged her to start walking as much as tolerated and explained the advantages of exercise for improving energy level, stress reduction and conditioning  Hypothyroidism: Adequately controlled with 75 mcg and will check on the next visit  HYPERTENSION: This is well-controlled on Zestoretic  History of PCOS: Controlled and her menstrual cycles are regular, she will continue metformin   There are no Patient Instructions on file for this visit.     Elayne Snare 06/13/2019, 3:52 PM

## 2019-06-14 ENCOUNTER — Encounter: Payer: Self-pay | Admitting: Endocrinology

## 2019-06-14 ENCOUNTER — Ambulatory Visit
Admission: RE | Admit: 2019-06-14 | Discharge: 2019-06-14 | Disposition: A | Discharge status: Home / Self Care | Payer: 59 | Source: Ambulatory Visit | Attending: Radiation Oncology | Admitting: Radiation Oncology

## 2019-06-14 ENCOUNTER — Other Ambulatory Visit: Payer: Self-pay

## 2019-06-14 DIAGNOSIS — C50411 Malignant neoplasm of upper-outer quadrant of right female breast: Secondary | ICD-10-CM | POA: Diagnosis not present

## 2019-06-15 ENCOUNTER — Ambulatory Visit
Admission: RE | Admit: 2019-06-15 | Discharge: 2019-06-15 | Disposition: A | Discharge status: Home / Self Care | Payer: 59 | Source: Ambulatory Visit | Attending: Radiation Oncology | Admitting: Radiation Oncology

## 2019-06-15 ENCOUNTER — Other Ambulatory Visit: Payer: Self-pay

## 2019-06-15 DIAGNOSIS — C50411 Malignant neoplasm of upper-outer quadrant of right female breast: Secondary | ICD-10-CM | POA: Diagnosis not present

## 2019-06-16 ENCOUNTER — Ambulatory Visit
Admission: RE | Admit: 2019-06-16 | Discharge: 2019-06-16 | Disposition: A | Discharge status: Home / Self Care | Payer: 59 | Source: Ambulatory Visit | Attending: Radiation Oncology | Admitting: Radiation Oncology

## 2019-06-16 ENCOUNTER — Other Ambulatory Visit: Payer: Self-pay

## 2019-06-16 DIAGNOSIS — C50411 Malignant neoplasm of upper-outer quadrant of right female breast: Secondary | ICD-10-CM | POA: Diagnosis not present

## 2019-06-17 ENCOUNTER — Ambulatory Visit
Admission: RE | Admit: 2019-06-17 | Discharge: 2019-06-17 | Disposition: A | Discharge status: Home / Self Care | Payer: 59 | Source: Ambulatory Visit | Attending: Radiation Oncology | Admitting: Radiation Oncology

## 2019-06-17 ENCOUNTER — Other Ambulatory Visit: Payer: Self-pay

## 2019-06-17 DIAGNOSIS — C50411 Malignant neoplasm of upper-outer quadrant of right female breast: Secondary | ICD-10-CM | POA: Diagnosis not present

## 2019-06-18 ENCOUNTER — Other Ambulatory Visit: Payer: Self-pay | Admitting: Endocrinology

## 2019-06-18 DIAGNOSIS — E063 Autoimmune thyroiditis: Secondary | ICD-10-CM

## 2019-06-20 ENCOUNTER — Ambulatory Visit
Admission: RE | Admit: 2019-06-20 | Discharge: 2019-06-20 | Disposition: A | Discharge status: Home / Self Care | Payer: 59 | Source: Ambulatory Visit | Attending: Radiation Oncology | Admitting: Radiation Oncology

## 2019-06-20 ENCOUNTER — Other Ambulatory Visit: Payer: Self-pay

## 2019-06-20 DIAGNOSIS — C50411 Malignant neoplasm of upper-outer quadrant of right female breast: Secondary | ICD-10-CM | POA: Diagnosis not present

## 2019-06-21 ENCOUNTER — Other Ambulatory Visit: Payer: Self-pay

## 2019-06-21 ENCOUNTER — Ambulatory Visit
Admission: RE | Admit: 2019-06-21 | Discharge: 2019-06-21 | Disposition: A | Discharge status: Home / Self Care | Payer: 59 | Source: Ambulatory Visit | Attending: Radiation Oncology | Admitting: Radiation Oncology

## 2019-06-21 DIAGNOSIS — C50411 Malignant neoplasm of upper-outer quadrant of right female breast: Secondary | ICD-10-CM | POA: Diagnosis not present

## 2019-06-22 ENCOUNTER — Ambulatory Visit
Admission: RE | Admit: 2019-06-22 | Discharge: 2019-06-22 | Disposition: A | Discharge status: Home / Self Care | Payer: 59 | Source: Ambulatory Visit | Attending: Radiation Oncology | Admitting: Radiation Oncology

## 2019-06-22 ENCOUNTER — Other Ambulatory Visit: Payer: Self-pay

## 2019-06-22 DIAGNOSIS — C50411 Malignant neoplasm of upper-outer quadrant of right female breast: Secondary | ICD-10-CM | POA: Diagnosis not present

## 2019-06-23 ENCOUNTER — Ambulatory Visit
Admission: RE | Admit: 2019-06-23 | Discharge: 2019-06-23 | Disposition: A | Discharge status: Home / Self Care | Payer: 59 | Source: Ambulatory Visit | Attending: Radiation Oncology | Admitting: Radiation Oncology

## 2019-06-23 ENCOUNTER — Other Ambulatory Visit: Payer: Self-pay

## 2019-06-23 DIAGNOSIS — C50411 Malignant neoplasm of upper-outer quadrant of right female breast: Secondary | ICD-10-CM | POA: Diagnosis not present

## 2019-06-24 ENCOUNTER — Ambulatory Visit
Admission: RE | Admit: 2019-06-24 | Discharge: 2019-06-24 | Disposition: A | Discharge status: Home / Self Care | Payer: 59 | Source: Ambulatory Visit | Attending: Radiation Oncology | Admitting: Radiation Oncology

## 2019-06-24 ENCOUNTER — Other Ambulatory Visit: Payer: Self-pay

## 2019-06-24 DIAGNOSIS — C50411 Malignant neoplasm of upper-outer quadrant of right female breast: Secondary | ICD-10-CM | POA: Diagnosis not present

## 2019-06-27 ENCOUNTER — Ambulatory Visit
Admission: RE | Admit: 2019-06-27 | Discharge: 2019-06-27 | Disposition: A | Discharge status: Home / Self Care | Payer: 59 | Source: Ambulatory Visit | Attending: Radiation Oncology | Admitting: Radiation Oncology

## 2019-06-27 ENCOUNTER — Other Ambulatory Visit: Payer: Self-pay

## 2019-06-27 DIAGNOSIS — C50411 Malignant neoplasm of upper-outer quadrant of right female breast: Secondary | ICD-10-CM | POA: Diagnosis not present

## 2019-06-28 ENCOUNTER — Ambulatory Visit
Admission: RE | Admit: 2019-06-28 | Discharge: 2019-06-28 | Disposition: A | Discharge status: Home / Self Care | Payer: 59 | Source: Ambulatory Visit | Attending: Radiation Oncology | Admitting: Radiation Oncology

## 2019-06-28 ENCOUNTER — Other Ambulatory Visit: Payer: Self-pay

## 2019-06-28 DIAGNOSIS — C50411 Malignant neoplasm of upper-outer quadrant of right female breast: Secondary | ICD-10-CM | POA: Diagnosis not present

## 2019-06-29 ENCOUNTER — Ambulatory Visit
Admission: RE | Admit: 2019-06-29 | Discharge: 2019-06-29 | Disposition: A | Discharge status: Home / Self Care | Payer: 59 | Source: Ambulatory Visit | Attending: Radiation Oncology | Admitting: Radiation Oncology

## 2019-06-29 ENCOUNTER — Other Ambulatory Visit: Payer: Self-pay

## 2019-06-29 DIAGNOSIS — C50411 Malignant neoplasm of upper-outer quadrant of right female breast: Secondary | ICD-10-CM | POA: Diagnosis not present

## 2019-06-30 ENCOUNTER — Other Ambulatory Visit: Payer: Self-pay

## 2019-06-30 ENCOUNTER — Ambulatory Visit
Admission: RE | Admit: 2019-06-30 | Discharge: 2019-06-30 | Disposition: A | Discharge status: Home / Self Care | Payer: 59 | Source: Ambulatory Visit | Attending: Radiation Oncology | Admitting: Radiation Oncology

## 2019-06-30 DIAGNOSIS — C50411 Malignant neoplasm of upper-outer quadrant of right female breast: Secondary | ICD-10-CM | POA: Diagnosis not present

## 2019-07-01 ENCOUNTER — Ambulatory Visit: Payer: 59 | Admitting: Radiation Oncology

## 2019-07-01 ENCOUNTER — Ambulatory Visit
Admission: RE | Admit: 2019-07-01 | Discharge: 2019-07-01 | Disposition: A | Discharge status: Home / Self Care | Payer: 59 | Source: Ambulatory Visit | Attending: Radiation Oncology | Admitting: Radiation Oncology

## 2019-07-01 ENCOUNTER — Other Ambulatory Visit: Payer: Self-pay

## 2019-07-01 DIAGNOSIS — C50411 Malignant neoplasm of upper-outer quadrant of right female breast: Secondary | ICD-10-CM | POA: Diagnosis not present

## 2019-07-04 ENCOUNTER — Other Ambulatory Visit: Payer: Self-pay

## 2019-07-04 ENCOUNTER — Ambulatory Visit
Admission: RE | Admit: 2019-07-04 | Discharge: 2019-07-04 | Disposition: A | Discharge status: Home / Self Care | Payer: 59 | Source: Ambulatory Visit | Attending: Radiation Oncology | Admitting: Radiation Oncology

## 2019-07-04 DIAGNOSIS — Z170 Estrogen receptor positive status [ER+]: Secondary | ICD-10-CM | POA: Diagnosis not present

## 2019-07-04 DIAGNOSIS — C50411 Malignant neoplasm of upper-outer quadrant of right female breast: Secondary | ICD-10-CM | POA: Diagnosis not present

## 2019-07-04 DIAGNOSIS — Z510 Encounter for antineoplastic radiation therapy: Secondary | ICD-10-CM | POA: Insufficient documentation

## 2019-07-04 NOTE — Assessment & Plan Note (Signed)
Antiestrogen treatment plan3/31/2020:Diagnostic mammogram detected 2.5cm calcifications in right breast. 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   Treatment plan: 1. Adjuvant radiation therapy started 05/31/2019 2. Followed by antiestrogen therapy with tamoxifen 5 years  Tamoxifen counseling: We discussed the risks and benefits of tamoxifen. These include but not limited to insomnia, hot flashes, mood changes, vaginal dryness, and weight gain. Although rare, serious side effects including endometrial cancer, risk of blood clots were also discussed. We strongly believe that the benefits far outweigh the risks. Patient understands these risks and consented to starting treatment. Planned treatment duration is 10 years.  Return to clinic in 3 months for survivorship care plan visit

## 2019-07-05 ENCOUNTER — Other Ambulatory Visit: Payer: Self-pay

## 2019-07-05 ENCOUNTER — Ambulatory Visit
Admission: RE | Admit: 2019-07-05 | Discharge: 2019-07-05 | Disposition: A | Discharge status: Home / Self Care | Payer: 59 | Source: Ambulatory Visit | Attending: Radiation Oncology | Admitting: Radiation Oncology

## 2019-07-05 DIAGNOSIS — C50411 Malignant neoplasm of upper-outer quadrant of right female breast: Secondary | ICD-10-CM | POA: Diagnosis not present

## 2019-07-06 ENCOUNTER — Other Ambulatory Visit: Payer: Self-pay

## 2019-07-06 ENCOUNTER — Ambulatory Visit
Admission: RE | Admit: 2019-07-06 | Discharge: 2019-07-06 | Disposition: A | Discharge status: Home / Self Care | Payer: 59 | Source: Ambulatory Visit | Attending: Radiation Oncology | Admitting: Radiation Oncology

## 2019-07-06 DIAGNOSIS — C50411 Malignant neoplasm of upper-outer quadrant of right female breast: Secondary | ICD-10-CM | POA: Diagnosis not present

## 2019-07-07 ENCOUNTER — Ambulatory Visit
Admission: RE | Admit: 2019-07-07 | Discharge: 2019-07-07 | Disposition: A | Discharge status: Home / Self Care | Payer: 59 | Source: Ambulatory Visit | Attending: Radiation Oncology | Admitting: Radiation Oncology

## 2019-07-07 DIAGNOSIS — C50411 Malignant neoplasm of upper-outer quadrant of right female breast: Secondary | ICD-10-CM | POA: Diagnosis not present

## 2019-07-08 ENCOUNTER — Ambulatory Visit: Payer: 59 | Admitting: Endocrinology

## 2019-07-08 ENCOUNTER — Other Ambulatory Visit: Payer: Self-pay

## 2019-07-08 ENCOUNTER — Ambulatory Visit
Admission: RE | Admit: 2019-07-08 | Discharge: 2019-07-08 | Disposition: A | Discharge status: Home / Self Care | Payer: 59 | Source: Ambulatory Visit | Attending: Radiation Oncology | Admitting: Radiation Oncology

## 2019-07-08 DIAGNOSIS — C50411 Malignant neoplasm of upper-outer quadrant of right female breast: Secondary | ICD-10-CM

## 2019-07-08 DIAGNOSIS — Z170 Estrogen receptor positive status [ER+]: Secondary | ICD-10-CM

## 2019-07-08 MED ORDER — SONAFINE EX EMUL
1.0000 "application " | Freq: Two times a day (BID) | CUTANEOUS | Status: DC
  Administered 2019-07-08: 1 via TOPICAL

## 2019-07-10 NOTE — Progress Notes (Signed)
Patient Care Team: Lennie Odor, PA-C as PCP - General (Nurse Practitioner) Mauro Kaufmann, RN as Oncology Nurse Navigator Rockwell Germany, RN as Oncology Nurse Navigator  DIAGNOSIS:    ICD-10-CM   1. Malignant neoplasm of upper-outer quadrant of right breast in female, estrogen receptor positive ([place])  C50.411    Z17.0     SUMMARY OF ONCOLOGIC HISTORY: Oncology History  Malignant neoplasm of upper-outer quadrant of right breast in female, estrogen receptor positive ([place])  03/01/2019 Initial Diagnosis   Diagnostic mammogram detected 2.5cm calcifications in right breast. 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    03/09/2019 Cancer Staging   Staging form: Breast, AJCC 8th Edition - Clinical stage from 03/09/2019: Stage 0 (cTis (DCIS), cN0, cM0, ER+, PR+) - Signed by Gardenia Phlegm, NP on 03/09/2019   04/06/2019 Genetic Testing   Negative genetic testing on the common hereditary cancer panel.  The Common Hereditary Gene Panel offered by [facility] includes sequencing and/or deletion duplication testing of the following 48 genes: APC, ATM, AXIN2, BARD1, BMPR1A, BRCA1, BRCA2, BRIP1, CDH1, CDK4, CDKN2A (p14ARF), CDKN2A (p16INK4a), CHEK2, CTNNA1, DICER1, EPCAM (Deletion/duplication testing only), GREM1 (promoter region deletion/duplication testing only), KIT, MEN1, MLH1, MSH2, MSH3, MSH6, MUTYH, NBN, NF1, NHTL1, PALB2, PDGFRA, PMS2, POLD1, POLE, PTEN, RAD50, RAD51C, RAD51D, RNF43, SDHB, SDHC, SDHD, SMAD4, SMARCA4. STK11, TP53, TSC1, TSC2, and VHL.  The following genes were evaluated for sequence changes only: SDHA and HOXB13 c.251G>A variant only. The report date is Apr 06, 2019.   04/20/2019 Surgery   Right lumpectomy ([facility]): DCIS, 1.5cm, intermediate grade, ER+ 95%, PR+ 95%, clear margins.    05/31/2019 -  Radiation Therapy   Adjuvant XRT     CHIEF COMPLIANT: Follow-up during radiation to discuss antiestrogen therapy  INTERVAL HISTORY: Nicole Schwartz is a  48 y.o. with above-mentioned history of right breast DCIS who underwent a lumpectomy and is currently on radiation therapy. She presents to the clinic today to discuss anti-estrogen therapy with tamoxifen.  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  REVIEW OF SYSTEMS:   Constitutional: Denies fevers, chills or abnormal weight loss Eyes: Denies blurriness of vision Ears, nose, mouth, throat, and face: Denies mucositis or sore throat Respiratory: Denies cough, dyspnea or wheezes Cardiovascular: Denies palpitation, chest discomfort Gastrointestinal: Denies nausea, heartburn or change in bowel habits Skin: Denies abnormal skin rashes Lymphatics: Denies new lymphadenopathy or easy bruising Neurological: Denies numbness, tingling or new weaknesses Behavioral/Psych: Mood is stable, no new changes  Extremities: No lower extremity edema Breast: Radiation dermatitis right breast All other systems were reviewed with the patient and are negative.  I have reviewed the past medical history, past surgical history, social history and family history with the patient and they are unchanged from previous note.  ALLERGIES:  has No Known Allergies.  MEDICATIONS:  Current Outpatient Medications  Medication Sig Dispense Refill  . diclofenac (VOLTAREN) 75 MG EC tablet Take 75 mg by mouth 2 (two) times daily.  2  . HYDROcodone-acetaminophen (NORCO/VICODIN) 5-325 MG tablet Take 1 tablet by mouth every 6 (six) hours as needed for moderate pain. 15 tablet 0  . hydroxychloroquine (PLAQUENIL) 200 MG tablet TAKE 1 TABLET BY MOUTH TWICE A DAY WITH FOOD OR MILK  2  . ibuprofen (ADVIL) 800 MG tablet Take 1 tablet (800 mg total) by mouth every 8 (eight) hours as needed. 30 tablet 0  . lisinopril-hydrochlorothiazide (ZESTORETIC) 10-12.5 MG tablet TAKE 1 TABLET BY MOUTH DAILY. [place]  tablet 1  . metFORMIN (GLUCOPHAGE-XR) 750 MG 24 hr tablet Take 2 tablets daily (Patient taking differently:  Take 750 mg by mouth daily with breakfast. TAKE 1 TABLET BY MOUTH ONCE DAILY.) 180 tablet 2  . SYNTHROID 75 MCG tablet TAKE 1 TABLET BY MOUTH EVERY DAY 90 tablet 1   No current facility-administered medications for this visit.     PHYSICAL EXAMINATION: ECOG PERFORMANCE STATUS: 1 - Symptomatic but completely ambulatory  Vitals:   07/11/19 0901  BP: (!) 147/91  Pulse: 94  Resp: 18  Temp: 98.5 F (36.9 C)  SpO2: 99%   Filed Weights   07/11/19 0901  Weight: 253 lb 3.2 oz (114.9 kg)    GENERAL: alert, no distress and comfortable SKIN: skin color, texture, turgor are normal, no rashes or significant lesions EYES: normal, Conjunctiva are pink and non-injected, sclera clear OROPHARYNX: no exudate, no erythema and lips, buccal mucosa, and tongue normal  NECK: supple, thyroid normal size, non-tender, without nodularity LYMPH: no palpable lymphadenopathy in the cervical, axillary or inguinal LUNGS: clear to auscultation and percussion with normal breathing effort HEART: regular rate & rhythm and no murmurs and no lower extremity edema ABDOMEN: abdomen soft, non-tender and normal bowel sounds MUSCULOSKELETAL: no cyanosis of digits and no clubbing  NEURO: alert & oriented x 3 with fluent speech, no focal motor/sensory deficits EXTREMITIES: No lower extremity edema  LABORATORY DATA:  I have reviewed the data as listed CMP Latest Ref Rng & Units 04/15/2019 02/15/2019 10/18/2018  Glucose 70 - 99 mg/dL 110(H) 108(H) 104(H)  BUN 6 - 20 mg/dL '10 14 13  ' Creatinine 0.44 - 1.00 mg/dL 0.78 0.68 0.73  Sodium 135 - 145 mmol/L 137 139 136  Potassium 3.5 - 5.1 mmol/L 3.5 3.7 3.7  Chloride 98 - 111 mmol/L 99 103 100  CO2 22 - 32 mmol/L '26 28 26  ' Calcium 8.9 - 10.3 mg/dL 9.7 9.7 9.9  Total Protein 6.0 - 8.3 g/dL - 7.0 7.6  Total Bilirubin 0.2 - 1.2 mg/dL - 0.4 0.6  Alkaline Phos 39 - 117 U/L - 55 53  AST 0 - 37 U/L - 14 15  ALT 0 - 35 U/L - 16 17    Lab Results  Component Value Date   WBC  5.4 04/15/2019   HGB 14.3 04/15/2019   HCT 41.6 04/15/2019   MCV 86.1 04/15/2019   PLT 281 04/15/2019    ASSESSMENT & PLAN:  Malignant neoplasm of upper-outer quadrant of right breast in female, estrogen receptor positive ([facility]) Antiestrogen treatment plan3/31/2020:Diagnostic mammogram detected 2.5cm calcifications in right breast. 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   Treatment plan: 1. Adjuvant radiation therapy started 05/31/2019 2. Followed by antiestrogen therapy with tamoxifen 5 years, will start 08/02/2019  Tamoxifen counseling: We discussed the risks and benefits of tamoxifen. These include but not limited to insomnia, hot flashes, mood changes, vaginal dryness, and weight gain. Although rare, serious side effects including endometrial cancer, risk of blood clots were also discussed. We strongly believe that the benefits far outweigh the risks. Patient understands these risks and consented to starting treatment. Planned treatment duration is 5 years.  Return to clinic in 3 months for survivorship care plan visit  No orders of the defined types were placed in this encounter.  The patient has a good understanding of the overall plan. she agrees with it. she will call with any problems  that may develop before the next visit here.  Nicholas Lose, MD 07/11/2019  Julious Oka Dorshimer am acting as scribe for Dr. Nicholas Lose.  I have reviewed the above documentation for accuracy and completeness, and I agree with the above.

## 2019-07-11 ENCOUNTER — Other Ambulatory Visit: Payer: Self-pay

## 2019-07-11 ENCOUNTER — Telehealth: Payer: Self-pay | Admitting: Hematology and Oncology

## 2019-07-11 ENCOUNTER — Ambulatory Visit
Admission: RE | Admit: 2019-07-11 | Discharge: 2019-07-11 | Disposition: A | Discharge status: Home / Self Care | Payer: 59 | Source: Ambulatory Visit | Attending: Radiation Oncology | Admitting: Radiation Oncology

## 2019-07-11 ENCOUNTER — Inpatient Hospital Stay: Payer: 59 | Attending: Hematology and Oncology | Admitting: Hematology and Oncology

## 2019-07-11 DIAGNOSIS — Z170 Estrogen receptor positive status [ER+]: Secondary | ICD-10-CM | POA: Diagnosis not present

## 2019-07-11 DIAGNOSIS — C50411 Malignant neoplasm of upper-outer quadrant of right female breast: Secondary | ICD-10-CM | POA: Diagnosis not present

## 2019-07-11 MED ORDER — TAMOXIFEN CITRATE 20 MG PO TABS: 20.0000 mg | ORAL_TABLET | Freq: Every day | ORAL | 3 refills | Status: DC

## 2019-07-11 NOTE — Telephone Encounter (Signed)
I left a message regarding schedule  

## 2019-07-12 ENCOUNTER — Other Ambulatory Visit: Payer: Self-pay

## 2019-07-12 ENCOUNTER — Ambulatory Visit
Admission: RE | Admit: 2019-07-12 | Discharge: 2019-07-12 | Disposition: A | Discharge status: Home / Self Care | Payer: 59 | Source: Ambulatory Visit | Attending: Radiation Oncology | Admitting: Radiation Oncology

## 2019-07-12 DIAGNOSIS — C50411 Malignant neoplasm of upper-outer quadrant of right female breast: Secondary | ICD-10-CM | POA: Diagnosis not present

## 2019-07-13 ENCOUNTER — Ambulatory Visit
Admission: RE | Admit: 2019-07-13 | Discharge: 2019-07-13 | Disposition: A | Discharge status: Home / Self Care | Payer: 59 | Source: Ambulatory Visit | Attending: Radiation Oncology | Admitting: Radiation Oncology

## 2019-07-13 ENCOUNTER — Other Ambulatory Visit: Payer: Self-pay

## 2019-07-13 DIAGNOSIS — C50411 Malignant neoplasm of upper-outer quadrant of right female breast: Secondary | ICD-10-CM | POA: Diagnosis not present

## 2019-07-14 ENCOUNTER — Other Ambulatory Visit: Payer: Self-pay

## 2019-07-14 ENCOUNTER — Encounter: Payer: Self-pay | Admitting: Radiation Oncology

## 2019-07-14 ENCOUNTER — Encounter: Payer: Self-pay | Admitting: *Deleted

## 2019-07-14 ENCOUNTER — Ambulatory Visit
Admission: RE | Admit: 2019-07-14 | Discharge: 2019-07-14 | Disposition: A | Discharge status: Home / Self Care | Payer: 59 | Source: Ambulatory Visit | Attending: Radiation Oncology | Admitting: Radiation Oncology

## 2019-07-14 DIAGNOSIS — C50411 Malignant neoplasm of upper-outer quadrant of right female breast: Secondary | ICD-10-CM | POA: Diagnosis not present

## 2019-07-14 NOTE — Progress Notes (Signed)
  Radiation Oncology         [phone]) [phone] ________________________________  Name: Nicole Schwartz MRN: [id number]  Date: 05/20/2019  DOB: [birth year]  Optical Surface Tracking Plan:  Since intensity modulated radiotherapy (IMRT) and 3D conformal radiation treatment methods are predicated on accurate and precise positioning for treatment, intrafraction motion monitoring is medically necessary to ensure accurate and safe treatment delivery.  The ability to quantify intrafraction motion without excessive ionizing radiation dose can only be performed with optical surface tracking. Accordingly, surface imaging offers the opportunity to obtain 3D measurements of patient position throughout IMRT and 3D treatments without excessive radiation exposure.  I am ordering optical surface tracking for this patient's upcoming course of radiotherapy. ________________________________  Kyung Rudd, MD 07/14/2019 5:46 PM    Reference:   Ursula Alert, J, et al. Surface imaging-based analysis of intrafraction motion for breast radiotherapy patients.Journal of [place], n. 6, nov. 2014. ISSN [id number].   Available at: <http://www.jacmp.org/index.php/jacmp/article/view/4957>.

## 2019-07-14 NOTE — Progress Notes (Signed)
  Radiation Oncology         [phone]) [phone] ________________________________  Name: Nicole Schwartz MRN: [id number]  Date: 05/20/2019  DOB: May 02, 1971  DIAGNOSIS:     ICD-10-CM   1. Malignant neoplasm of upper-outer quadrant of right breast in female, estrogen receptor positive ([place])  C50.411    Z17.0      SIMULATION AND TREATMENT PLANNING NOTE  The patient presented for simulation prior to beginning her course of radiation treatment for her diagnosis of right-sided breast cancer. The patient was placed in a supine position on a breast board. A customized vac-lock bag was constructed and this complex treatment device will be used on a daily basis during her treatment. In this fashion, a CT scan was obtained through the chest area and an isocenter was placed near the chest wall within the breast.  The patient will be planned to receive a course of radiation initially to a dose of 50.4 Gy. This will consist of a whole breast radiotherapy technique. To accomplish this, 2 customized blocks have been designed which will correspond to medial and lateral whole breast tangent fields. This treatment will be accomplished at 1.8 Gy per fraction. A forward planning technique will also be evaluated to determine if this approach improves the plan. It is anticipated that the patient will then receive a 10 Gy boost to the seroma cavity which has been contoured. This will be accomplished at 2 Gy per fraction.   This initial treatment will consist of a 3-D conformal technique. The seroma has been contoured as the primary target structure. Additionally, dose volume histograms of both this target as well as the lungs and heart will also be evaluated. Such an approach is necessary to ensure that the target area is adequately covered while the nearby critical normal structures are adequately spared.  Plan:  The final anticipated total dose therefore will correspond to 60.4 Gy.    _______________________________    Jodelle Gross, MD, PhD

## 2019-07-25 ENCOUNTER — Encounter: Payer: Self-pay | Admitting: Adult Health

## 2019-07-25 ENCOUNTER — Telehealth: Payer: Self-pay | Admitting: *Deleted

## 2019-07-25 ENCOUNTER — Encounter: Payer: Self-pay | Admitting: Radiation Oncology

## 2019-07-25 ENCOUNTER — Other Ambulatory Visit: Payer: Self-pay | Admitting: Radiation Oncology

## 2019-07-25 MED ORDER — SULFAMETHOXAZOLE-TRIMETHOPRIM 800-160 MG PO TABS: 1.0000 | ORAL_TABLET | Freq: Two times a day (BID) | ORAL | 0 refills | Status: AC

## 2019-07-25 NOTE — Progress Notes (Signed)
Pt noticed erythematous area on abdomen. She recently completed radiotherapy but the site of her lesion which she took a picture and sent in is outside of the radiation field. It appears to have cellulitis and an rx for Bactrim DS #14, one bid for 7 days was sent into her pharmacy. She was instructed to draw the borders of the erythema and let us know if the erythema extends past the borders she is drawing.

## 2019-07-25 NOTE — Telephone Encounter (Signed)
Spoke with the patient to let her know that the picture has been received and reviewed.  She was informed that the area did not look like it was radiation related but instead looked like a bug bite that is infected.  She was informed that a prescription for Bactrim was called in to her pharmacy.  She was asked to use a marker to circle the red area and to monitor it to ensure that the area is not growing.  In the event the area is to grow then she was advised to contact her PCP.  She verbalized all directions.  Will continue to follow as necessary.  Gloriajean Dell. Leonie Green, BSN

## 2019-08-10 ENCOUNTER — Telehealth: Payer: Self-pay | Admitting: Radiation Oncology

## 2019-08-10 NOTE — Progress Notes (Signed)
  Radiation Oncology         [phone]) [phone] ________________________________  Name: Nicole Schwartz MRN: [id number]  Date: 07/14/2019  DOB: 08-25-1971  End of Treatment Note  Diagnosis:   right-sided breast cancer     Indication for treatment:  Curative       Radiation treatment dates:   05/30/2019 through 07/14/2019  Site/dose:   The patient initially received a dose of 50.4 Gy in 28 fractions to the breast using whole-breast tangent fields. This was delivered using a 3-D conformal technique. The patient then received a boost to the seroma. This delivered an additional 10 Gy in 5 fractions using a 3 field boost technique. The total dose was 60.4 Gy.  Narrative: The patient tolerated radiation treatment relatively well.   The patient had some expected skin irritation as she progressed during treatment. Moist desquamation was not present at the end of treatment.  Plan: The patient has completed radiation treatment. The patient will return to radiation oncology clinic for routine followup in one month. I advised the patient to call or return sooner if they have any questions or concerns related to their recovery or treatment. ________________________________  Jodelle Gross, M.D., Ph.D.

## 2019-08-10 NOTE — Telephone Encounter (Signed)
  Radiation Oncology         [phone]) [phone] ________________________________  Name: Nicole Schwartz MRN: [id number]  Date of Service: 08/10/2019  DOB: 07-Aug-1971  Post Treatment Telephone Note  Diagnosis:   ER/PR positive DCIS of the right breast  Interval Since Last Radiation:  4 weeks   05/30/2019-07/14/2019: The right breast was treated to 50.4 Gy in 28 fractions and also received a 10 Gy boost in 5 fractions the lumpectomy site.   Narrative:  The patient was contacted today for routine follow-up. During treatment she did very well with radiotherapy and did not have significant desquamation. She reports she is doing well overall and her skin is healing nicely. She denies any fatigue at this time.   Impression/Plan: 1. ER/PR positive DCIS of the right breast. The patient has been doing well since completion of radiotherapy. We discussed that we would be happy to continue to follow her as needed, but she will also continue to follow up with Dr. Lindi Adie in medical oncology. She was counseled on skin care as well as measures to avoid sun exposure to this area.  2. Survivorship. We discussed the importance of survivorship evaluation and was given the phone number for [facility] Stain [phone]) to be added to the list to receive the monthly resource calendar for the cancer center.     Carola Rhine, PAC

## 2019-09-06 ENCOUNTER — Other Ambulatory Visit: Payer: Self-pay | Admitting: Endocrinology

## 2019-09-21 ENCOUNTER — Telehealth: Payer: Self-pay

## 2019-09-21 NOTE — Telephone Encounter (Signed)
Does pt need to be on Synthroid name brand? If so, PA is required.

## 2019-09-21 NOTE — Telephone Encounter (Signed)
We can try switching her to Unithroid brand name same dose

## 2019-09-22 ENCOUNTER — Other Ambulatory Visit: Payer: Self-pay

## 2019-09-22 MED ORDER — UNITHROID 75 MCG PO TABS: ORAL_TABLET | ORAL | 2 refills | Status: DC

## 2019-09-22 NOTE — Telephone Encounter (Signed)
Rx sent 

## 2019-10-05 ENCOUNTER — Other Ambulatory Visit: Payer: Self-pay

## 2019-10-05 ENCOUNTER — Other Ambulatory Visit (INDEPENDENT_AMBULATORY_CARE_PROVIDER_SITE_OTHER): Payer: 59

## 2019-10-05 DIAGNOSIS — E782 Mixed hyperlipidemia: Secondary | ICD-10-CM

## 2019-10-05 DIAGNOSIS — E063 Autoimmune thyroiditis: Secondary | ICD-10-CM

## 2019-10-05 LAB — LIPID PANEL
Cholesterol: 154 mg/dL (ref 0–200)
HDL: 35.5 mg/dL — ABNORMAL LOW (ref >39.00)
LDL Cholesterol: 84 mg/dL (ref 0–99)
NonHDL: 118.87
Total CHOL/HDL Ratio: 4
Triglycerides: 172 mg/dL — ABNORMAL HIGH (ref 0.0–149.0)
VLDL: 34.4 mg/dL (ref 0.0–40.0)

## 2019-10-05 LAB — COMPREHENSIVE METABOLIC PANEL
ALT: 35 U/L (ref 0–35)
AST: 22 U/L (ref 0–37)
Albumin: 4 g/dL (ref 3.5–5.2)
Alkaline Phosphatase: 68 U/L (ref 39–117)
BUN: 10 mg/dL (ref 6–23)
CO2: 28 mEq/L (ref 19–32)
Calcium: 9.8 mg/dL (ref 8.4–10.5)
Chloride: 101 mEq/L (ref 96–112)
Creatinine, Ser: 0.77 mg/dL (ref 0.40–1.20)
GFR: 80.1 mL/min (ref >60.00)
Glucose, Bld: 101 mg/dL — ABNORMAL HIGH (ref 70–99)
Potassium: 3.7 mEq/L (ref 3.5–5.1)
Sodium: 137 mEq/L (ref 135–145)
Total Bilirubin: 0.4 mg/dL (ref 0.2–1.2)
Total Protein: 7.4 g/dL (ref 6.0–8.3)

## 2019-10-05 LAB — TSH: TSH: 3.12 u[IU]/mL (ref 0.35–4.50)

## 2019-10-10 ENCOUNTER — Other Ambulatory Visit: Payer: 59

## 2019-10-11 ENCOUNTER — Other Ambulatory Visit: Payer: Self-pay

## 2019-10-13 ENCOUNTER — Ambulatory Visit (INDEPENDENT_AMBULATORY_CARE_PROVIDER_SITE_OTHER): Payer: 59 | Admitting: Endocrinology

## 2019-10-13 ENCOUNTER — Encounter: Payer: Self-pay | Admitting: Endocrinology

## 2019-10-13 VITALS — BP 126/70 | HR 86 | Ht 61.5 in | Wt 249.4 lb

## 2019-10-13 DIAGNOSIS — R7301 Impaired fasting glucose: Secondary | ICD-10-CM | POA: Diagnosis not present

## 2019-10-13 DIAGNOSIS — E782 Mixed hyperlipidemia: Secondary | ICD-10-CM | POA: Diagnosis not present

## 2019-10-13 DIAGNOSIS — E063 Autoimmune thyroiditis: Secondary | ICD-10-CM | POA: Diagnosis not present

## 2019-10-13 MED ORDER — METFORMIN HCL ER 750 MG PO TB24: ORAL_TABLET | ORAL | 2 refills | Status: DC

## 2019-10-13 NOTE — Progress Notes (Signed)
Patient ID: Nicole Schwartz, female   DOB: 04-17-1971, 48 y.o.   MRN: [id number]    Chief complaint: Followup of various problems   History of Present Illness:  OBESITY:    In 8/16 she was started on Belviq and with this she had previously lost over 20 pounds with baseline weight of 258 However her insurance does not cover this or any other weight loss medications including Saxenda Did not tolerate low-dose phentermine  Her weight was increased further in 01/2019 and she was recommended Contrave However she did not get the prescription for Contrave filled  She appears to have lost a little weight, about 8 pounds since March This is partly from changes in appetite after radiation  However she is trying to eat more baked foods, take lunches from home and generally avoiding high-fat foods  She again has not done any exercise because of fatigue   Wt Readings from Last 3 Encounters:  10/13/19 249 lb 6.4 oz (113.1 kg)  07/11/19 253 lb 3.2 oz (114.9 kg)  05/05/19 247 lb (112 kg)    HYPERTENSION:  She has had hypertension since at least 2004. Has been on ACE inhibitor since about 2005 Because of swelling of her legs her ACE inhibitor was changed to low-dose Lotensin HCTZ and subsequently to hold lisinopril HCT 10/12.5  Blood pressure consistently well-controlled She also monitors at home   BP Readings from Last 3 Encounters:  10/13/19 126/70  07/11/19 (!) 147/91  04/20/19 125/79      IMPAIRED FASTING GLUCOSE: Her glucose is now 101 fasting and has been as high as 113 This appears improved with her weight loss Has been doing better with her diet  She has been  on metformin ER 750 mg, taking 1 tablet twice daily   A1c has been quite normal below the pre-diabetic range consistently   Lab Results  Component Value Date   HGBA1C 5.3 10/18/2018   HGBA1C 5.3 01/18/2018   HGBA1C 5.2 05/14/2017   Lab Results  Component Value Date   LDLCALC 84 10/05/2019   CREATININE 0.77 10/05/2019    HYPOTHYROIDISM:  She has had a goiter since at least 1998. Baseline TSH was 5.4 when she was started on levothyroxine in 2000 and has required small doses of levothyroxine  She is complaining of fatigue for the last several months but has had other issues  Has not required a change in her 75 g dose for the last few years TSH again   Lab Results  Component Value Date   TSH 3.12 10/05/2019   TSH 1.77 02/15/2019   TSH 2.11 10/18/2018   FREET4 1.07 01/31/2016   FREET4 0.81 03/07/2014   FREET4 0.84 09/09/2013     HYPERLIPIDEMIA: She has had diabetic dyslipidemia with HDL as low as 23 and high triglycerides. Previously on Crestor and fenofibrate combination  Since LDL was relatively higher she was started back on Crestor and this was increased in 2/19 up to 10 mg since LDL was 117 However after her last visit she appears not to have renewed her Crestor Despite this her LDL is below 100, her diet appears to be better  Tends to have persistently high triglycerides and these are now below 200  Lab Results  Component Value Date   CHOL 154 10/05/2019   CHOL 189 02/15/2019   CHOL 183 10/18/2018   Lab Results  Component Value Date   HDL 35.50 (L) 10/05/2019   HDL 40.60 02/15/2019   HDL  38.80 (L) 10/18/2018   Lab Results  Component Value Date   LDLCALC 84 10/05/2019   LDLCALC 98 06/10/2018   LDLCALC 105 (H) 05/14/2017   Lab Results  Component Value Date   TRIG 172.0 (H) 10/05/2019   TRIG 217.0 (H) 02/15/2019   TRIG 230.0 (H) 10/18/2018   Lab Results  Component Value Date   CHOLHDL 4 10/05/2019   CHOLHDL 5 02/15/2019   CHOLHDL 5 10/18/2018   Lab Results  Component Value Date   LDLDIRECT 113.0 02/15/2019   LDLDIRECT 122.0 10/18/2018   LDLDIRECT 119.0 01/18/2018      Allergies as of 10/13/2019   No Known Allergies     Medication List       Accurate as of October 13, 2019  3:32 PM. If you have any questions, ask your nurse or  doctor.        STOP taking these medications   HYDROcodone-acetaminophen 5-325 MG tablet Commonly known as: NORCO/VICODIN Stopped by: Elayne Snare, MD     TAKE these medications   diclofenac 75 MG EC tablet Commonly known as: VOLTAREN Take 75 mg by mouth 2 (two) times daily.   hydroxychloroquine 200 MG tablet Commonly known as: PLAQUENIL TAKE 1 TABLET BY MOUTH TWICE A DAY WITH FOOD OR MILK   ibuprofen 800 MG tablet Commonly known as: ADVIL Take 1 tablet (800 mg total) by mouth every 8 (eight) hours as needed.   lisinopril-hydrochlorothiazide 10-12.5 MG tablet Commonly known as: ZESTORETIC TAKE 1 TABLET BY MOUTH DAILY.   metFORMIN 750 MG 24 hr tablet Commonly known as: GLUCOPHAGE-XR Take 2 tablets daily What changed:   how much to take  how to take this  when to take this  additional instructions   tamoxifen 20 MG tablet Commonly known as: NOLVADEX Take 1 tablet (20 mg total) by mouth daily.   Unithroid 75 MCG tablet Generic drug: levothyroxine Take 1 tab by mouth once daily. Replaces name brand Synthroid       Allergies: No Known Allergies  Past Medical History:  Diagnosis Date  . Arthritis    RA  . Family history of breast cancer   . Hypertension   . Hypothyroidism     Past Surgical History:  Procedure Laterality Date  . BREAST LUMPECTOMY WITH RADIOACTIVE SEED LOCALIZATION Right 04/20/2019   Procedure: RIGHT BREAST LUMPECTOMY WITH RADIOACTIVE SEED LOCALIZATION;  Surgeon: Erroll Luna, MD;  Location: [place];  Service: General;  Laterality: Right;  . CESAREAN SECTION     x2    Family History  Problem Relation Age of Onset  . Thyroid disease Mother   . Diabetes Maternal Grandfather   . Hypertension Maternal Grandfather   . Diabetes Maternal Grandmother   . Breast cancer Paternal Grandmother     Social History:  reports that she has never smoked. She has never used smokeless tobacco. She reports previous alcohol use. She  reports that she does not use drugs.  Review of Systems   She has been told to have rheumatoid arthritis, On Plaquenil   History of PCOS: Her menstrual cycles are regular Free testosterone was last normal  Labs:  No visits with results within 1 Week(s) from this visit.  Latest known visit with results is:  Lab on 10/05/2019  Component Date Value Ref Range Status  . Sodium 10/05/2019 137  135 - 145 mEq/L Final  . Potassium 10/05/2019 3.7  3.5 - 5.1 mEq/L Final  . Chloride 10/05/2019 101  96 - 112 mEq/L  Final  . CO2 10/05/2019 28  19 - 32 mEq/L Final  . Glucose, Bld 10/05/2019 101* 70 - 99 mg/dL Final  . BUN 10/05/2019 10  6 - 23 mg/dL Final  . Creatinine, Ser 10/05/2019 0.77  0.40 - 1.20 mg/dL Final  . Total Bilirubin 10/05/2019 0.4  0.2 - 1.2 mg/dL Final  . Alkaline Phosphatase 10/05/2019 68  39 - 117 U/L Final  . AST 10/05/2019 22  0 - 37 U/L Final  . ALT 10/05/2019 35  0 - 35 U/L Final  . Total Protein 10/05/2019 7.4  6.0 - 8.3 g/dL Final  . Albumin 10/05/2019 4.0  3.5 - 5.2 g/dL Final  . Calcium 10/05/2019 9.8  8.4 - 10.5 mg/dL Final  . GFR 10/05/2019 80.10  >60.00 mL/min Final  . Cholesterol 10/05/2019 154  0 - 200 mg/dL Final   ATP III Classification       Desirable:  < 200 mg/dL               Borderline High:  200 - 239 mg/dL          High:  > = 240 mg/dL  . Triglycerides 10/05/2019 172.0* 0.0 - 149.0 mg/dL Final   Normal:  <150 mg/dLBorderline High:  150 - 199 mg/dL  . HDL 10/05/2019 35.50* >39.00 mg/dL Final  . VLDL 10/05/2019 34.4  0.0 - 40.0 mg/dL Final  . LDL Cholesterol 10/05/2019 84  0 - 99 mg/dL Final  . Total CHOL/HDL Ratio 10/05/2019 4   Final                  Men          Women1/2 Average Risk     3.4          3.3Average Risk          5.0          4.42X Average Risk          9.6          7.13X Average Risk          15.0          11.0                      . NonHDL 10/05/2019 118.87   Final   NOTE:  Non-HDL goal should be 30 mg/dL higher than patient's LDL goal  (i.e. LDL goal of < 70 mg/dL, would have non-HDL goal of < 100 mg/dL)  . TSH 10/05/2019 3.12  0.35 - 4.50 uIU/mL Final      EXAM:  BP 126/70 (BP Location: Left Arm, Patient Position: Sitting, Cuff Size: Normal)   Pulse 86   Ht 5' 1.5" (1.562 m)   Wt 249 lb 6.4 oz (113.1 kg)   SpO2 99%   BMI 46.36 kg/m      Assessment/Plan:   OBESITY:  She has managed to lose weight and keep it obviously Her diet is excellent usually However she is not exercising but does not have the energy to do so recently   Hypothyroidism: Adequately controlled with 75 mcg Likely her fatigue is from other reasons  Impaired fasting glucose: Relatively better fasting reading and this is only 101 compared to previous readings She will continue Metformin  LIPIDS: Likely from better diet and some weight loss consistently and her LDL is below 100 and triglycerides below 200 We will continue to monitor without any medications for now, previously on Crestor  HYPERTENSION: This is well-controlled on Zestoretic  History of PCOS: Controlled and her menstrual cycles are regular, she will continue metformin   There are no Patient Instructions on file for this visit.     Elayne Snare 10/13/2019, 3:32 PM

## 2019-10-25 ENCOUNTER — Telehealth: Payer: Self-pay | Admitting: Adult Health

## 2019-10-25 NOTE — Telephone Encounter (Signed)
I left a message regarding video visit also if she had any question or concerns

## 2019-11-01 ENCOUNTER — Encounter: Payer: Self-pay | Admitting: Adult Health

## 2019-11-01 ENCOUNTER — Inpatient Hospital Stay: Payer: 59 | Attending: Adult Health | Admitting: Adult Health

## 2019-11-01 DIAGNOSIS — Z170 Estrogen receptor positive status [ER+]: Secondary | ICD-10-CM

## 2019-11-01 DIAGNOSIS — Z79810 Long term (current) use of selective estrogen receptor modulators (SERMs): Secondary | ICD-10-CM

## 2019-11-01 DIAGNOSIS — C50411 Malignant neoplasm of upper-outer quadrant of right female breast: Secondary | ICD-10-CM

## 2019-11-01 NOTE — Progress Notes (Signed)
SURVIVORSHIP VIRTUAL VISIT:  I connected with Nicole Schwartz on 11/01/19 at 10:00 AM EST by my chart video and verified that I am speaking with the correct person using two identifiers.   I discussed the limitations, risks, security and privacy concerns of performing an evaluation and management service virtually and the availability of in person appointments. I also discussed with the patient that there may be a patient responsible charge related to this service. The patient expressed understanding and agreed to proceed.   Patient location: at work in private room Provider location: office  BRIEF ONCOLOGIC HISTORY:  Oncology History  Malignant neoplasm of upper-outer quadrant of right breast in female, estrogen receptor positive ([place])  03/01/2019 Initial Diagnosis   Diagnostic mammogram detected 2.5cm calcifications in right breast. 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    03/09/2019 Cancer Staging   Staging form: Breast, AJCC 8th Edition - Clinical stage from 03/09/2019: Stage 0 (cTis (DCIS), cN0, cM0, ER+, PR+) - Signed by Gardenia Phlegm, NP on 03/09/2019   04/06/2019 Genetic Testing   Negative genetic testing on the common hereditary cancer panel.  The Common Hereditary Gene Panel offered by [facility] includes sequencing and/or deletion duplication testing of the following 48 genes: APC, ATM, AXIN2, BARD1, BMPR1A, BRCA1, BRCA2, BRIP1, CDH1, CDK4, CDKN2A (p14ARF), CDKN2A (p16INK4a), CHEK2, CTNNA1, DICER1, EPCAM (Deletion/duplication testing only), GREM1 (promoter region deletion/duplication testing only), KIT, MEN1, MLH1, MSH2, MSH3, MSH6, MUTYH, NBN, NF1, NHTL1, PALB2, PDGFRA, PMS2, POLD1, POLE, PTEN, RAD50, RAD51C, RAD51D, RNF43, SDHB, SDHC, SDHD, SMAD4, SMARCA4. STK11, TP53, TSC1, TSC2, and VHL.  The following genes were evaluated for sequence changes only: SDHA and HOXB13 c.251G>A variant only. The report date is Apr 06, 2019.   04/20/2019 Surgery   Right lumpectomy  ([facility]): DCIS, 1.5cm, intermediate grade, ER+ 95%, PR+ 95%, clear margins.    04/20/2019 Cancer Staging   Staging form: Breast, AJCC 8th Edition - Pathologic stage from 04/20/2019: Stage 0 (pTis (DCIS), pN0, cM0, ER+, PR+) - Signed by Gardenia Phlegm, NP on 10/23/2019   05/31/2019 -  Radiation Therapy   Adjuvant XRT   08/2019 -  Anti-estrogen oral therapy   Tamoxifen daily     INTERVAL HISTORY:  Nicole Schwartz to review her survivorship care plan detailing her treatment course for breast cancer, as well as monitoring long-term side effects of that treatment, education regarding health maintenance, screening, and overall wellness and health promotion.     Overall, Nicole Schwartz reports feeling quite well.  She is taking Tamoxifen and tolerating it well.  She denies any hot flashes, vaginal discharge or arthralgias.    Nicole Schwartz is back at work at [employer].  She works as an Primary school teacher.  She is able to follow appropriate pandemic precautions.    REVIEW OF SYSTEMS:  Review of Systems  Constitutional: Negative for appetite change, chills, fatigue, fever and unexpected weight change.  HENT:   Negative for hearing loss, lump/mass, sore throat and trouble swallowing.   Eyes: Negative for eye problems and icterus.  Respiratory: Negative for chest tightness, cough and shortness of breath.   Cardiovascular: Negative for chest pain, leg swelling and palpitations.  Gastrointestinal: Negative for abdominal distention, abdominal pain, constipation, diarrhea, nausea and vomiting.  Endocrine: Negative for hot flashes.  Genitourinary: Negative for difficulty urinating.   Musculoskeletal: Negative for arthralgias.  Skin: Negative for itching and rash.  Neurological: Negative for dizziness, extremity weakness, headaches and numbness.  Hematological: Negative for adenopathy. Does not bruise/bleed easily.  Psychiatric/Behavioral: Negative for depression. The patient is not  nervous/anxious.    Breast: Denies any new nodularity, masses, tenderness, nipple changes, or nipple discharge.      ONCOLOGY TREATMENT TEAM:  1. Surgeon:  Dr. Brantley Stage at [facility] 2. Medical Oncologist: Dr. Lindi Adie  3. Radiation Oncologist: Dr. Lisbeth Renshaw    PAST MEDICAL/SURGICAL HISTORY:  Past Medical History:  Diagnosis Date  . Arthritis    RA  . Family history of breast cancer   . Hypertension   . Hypothyroidism    Past Surgical History:  Procedure Laterality Date  . BREAST LUMPECTOMY WITH RADIOACTIVE SEED LOCALIZATION Right 04/20/2019   Procedure: RIGHT BREAST LUMPECTOMY WITH RADIOACTIVE SEED LOCALIZATION;  Surgeon: Erroll Luna, MD;  Location: [place];  Service: General;  Laterality: Right;  . CESAREAN SECTION     x2     ALLERGIES:  No Known Allergies   CURRENT MEDICATIONS:  Outpatient Encounter Medications as of 11/01/2019  Medication Sig Note  . diclofenac (VOLTAREN) 75 MG EC tablet Take 75 mg by mouth 2 (two) times daily. 02/07/2015: Received from: External Pharmacy  . hydroxychloroquine (PLAQUENIL) 200 MG tablet TAKE 1 TABLET BY MOUTH TWICE A DAY WITH FOOD OR MILK 06/09/2016: Received from: External Pharmacy  . ibuprofen (ADVIL) 800 MG tablet Take 1 tablet (800 mg total) by mouth every 8 (eight) hours as needed.   Marland Kitchen lisinopril-hydrochlorothiazide (ZESTORETIC) 10-12.5 MG tablet TAKE 1 TABLET BY MOUTH DAILY.   . metFORMIN (GLUCOPHAGE-XR) 750 MG 24 hr tablet Take 2 tablets daily   . tamoxifen (NOLVADEX) 20 MG tablet Take 1 tablet (20 mg total) by mouth daily.   Marland Kitchen UNITHROID 75 MCG tablet Take 1 tab by mouth once daily. Replaces name brand Synthroid    No facility-administered encounter medications on file as of 11/01/2019.      ONCOLOGIC FAMILY HISTORY:  Family History  Problem Relation Age of Onset  . Thyroid disease Mother   . Diabetes Maternal Grandfather   . Hypertension Maternal Grandfather   . Diabetes Maternal Grandmother    . Breast cancer Paternal Grandmother      GENETIC COUNSELING/TESTING:see above  SOCIAL HISTORY:  Social History   Socioeconomic History  . Marital status: Unknown    Spouse name: Not on file  . Number of children: Not on file  . Years of education: Not on file  . Highest education level: Not on file  Occupational History  . Not on file  Social Needs  . Financial resource strain: Not on file  . Food insecurity    Worry: Not on file    Inability: Not on file  . Transportation needs    Medical: No    Non-medical: No  Tobacco Use  . Smoking status: Never Smoker  . Smokeless tobacco: Never Used  Substance and Sexual Activity  . Alcohol use: Not Currently    Frequency: Never  . Drug use: Never  . Sexual activity: Yes    Comment: nothing husband unable to produce children  Lifestyle  . Physical activity    Days per week: Not on file    Minutes per session: Not on file  . Stress: Not on file  Relationships  . Social Herbalist on phone: Not on file    Gets together: Not on file    Attends religious service: Not on file    Active member of club or organization: Not on file    Attends meetings of clubs or organizations: Not  on file    Relationship status: Not on file  . Intimate partner violence    Fear of current or ex partner: Not on file    Emotionally abused: Not on file    Physically abused: Not on file    Forced sexual activity: Not on file  Other Topics Concern  . Not on file  Social History Narrative  . Not on file     OBSERVATIONS/OBJECTIVE:   LABORATORY DATA:  None for this visit.  DIAGNOSTIC IMAGING:  None for this visit.      ASSESSMENT AND PLAN:  Nicole Schwartz is a pleasant 48 y.o. female with Stage 0 right breast DCIS, ER+/PR+, diagnosed in 01/2019, treated with lumpectomy, adjuvant radiation therapy, and anti-estrogen therapy with Tamxofein beginning in 08/2019.  She presents to the Survivorship Clinic for our initial meeting and  routine follow-up post-completion of treatment for breast cancer.    1. Stage 0 right breast cancer:  Nicole Schwartz is continuing to recover from definitive treatment for breast cancer. She will follow-up with her medical oncologist, Dr. Lindi Adie in 05/2020 with history and physical exam per surveillance protocol.  She will continue her anti-estrogen therapy with Tamoxifen. Thus far, she is tolerating the Tamoxifen well, with minimal side effects. She was instructed to make Dr. Lindi Adie or myself aware if she begins to experience any worsening side effects of the medication and I could see her back in clinic to help manage those side effects, as needed. Her mammogram is due 01/2020; orders placed today. Today, a comprehensive survivorship care plan and treatment summary was reviewed with the patient today detailing her breast cancer diagnosis, treatment course, potential late/long-term effects of treatment, appropriate follow-up care with recommendations for the future, and patient education resources.  A copy of this summary, along with a letter will be sent to the patient's primary care provider via mail/fax/In Basket message after today's visit.    2 Bone health:  Given Nicole Schwartz history of breast cancer, she is at risk for bone demineralization.  I counseled her that tamoxifen has a protective effect on her bones. She was given education on specific activities to promote bone health.  3. Cancer screening:  Due to Nicole Schwartz's history and her age, she should receive screening for skin cancers, colon cancer, and gynecologic cancers.  The information and recommendations are listed on the patient's comprehensive care plan/treatment summary and were reviewed in detail with the patient.    4. Health maintenance and wellness promotion: Nicole Schwartz was encouraged to consume 5-7 servings of fruits and vegetables per day. We reviewed the "Nutrition Rainbow" handout, as well as the handout "Take Control of Your Health and  Reduce Your Cancer Risk" from the [place].  She was also encouraged to engage in moderate to vigorous exercise for 30 minutes per day most days of the week. We discussed the [employer] fitness program, which is designed for cancer survivors to help them become more physically fit after cancer treatments.  She was instructed to limit her alcohol consumption and continue to abstain from tobacco use.     5. Support services/counseling: It is not uncommon for this period of the patient's cancer care trajectory to be one of many emotions and stressors.  We discussed how this can be increasingly difficult during the times of quarantine and social distancing due to the COVID-19 pandemic.   She was given information regarding our available services and encouraged to contact me with any questions or for  help enrolling in any of our support group/programs.    Follow up instructions:    -Return to cancer center for f/u with Dr. Lindi Adie in 05/2020  -Mammogram due in 01/2020 -Follow up with Dr. Brantley Stage in 11/2019 -She is welcome to return back to the Survivorship Clinic at any time; no additional follow-up needed at this time.  -Consider referral back to survivorship as a long-term survivor for continued surveillance  The patient was provided an opportunity to ask questions and all were answered. The patient agreed with the plan and demonstrated an understanding of the instructions.   The patient was advised to call back or seek an in-person evaluation if the symptoms worsen or if the condition fails to improve as anticipated.   I provided 25 minutes of face-to-face video visit time during this encounter, and > 50% was spent counseling as documented under my assessment & plan.  Scot Dock, NP

## 2019-11-16 IMAGING — MG MM PLC BREAST LOC DEV 1ST LESION INC*R*
3 series · 3 of 3 positions shown · non-contrast
Comparison: Previous exam(s).

CLINICAL DATA: Recently diagnosed RIGHT breast DCIS scheduled for
breast conservation surgery requiring preoperative radioactive seed
localization.

EXAM:
MAMMOGRAPHIC GUIDED RADIOACTIVE SEED LOCALIZATION OF THE RIGHT
BREAST

[R CC (1 of 2)]
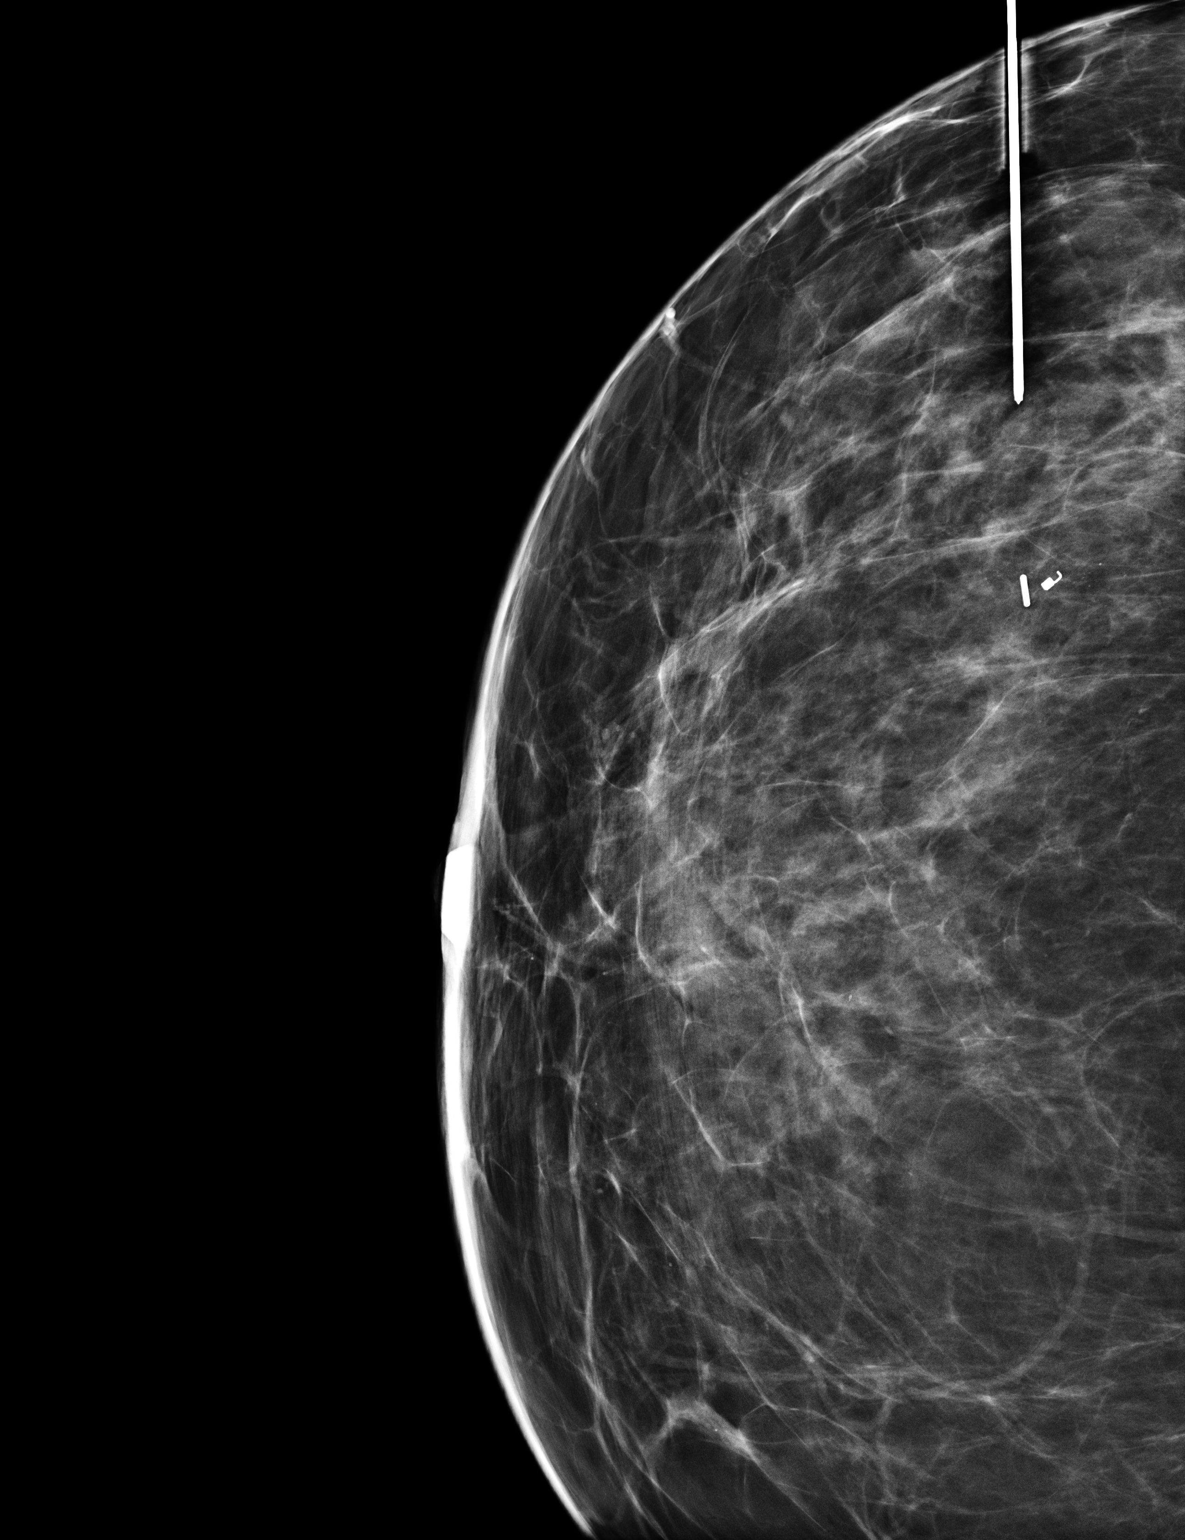

[R CC (2 of 2)]
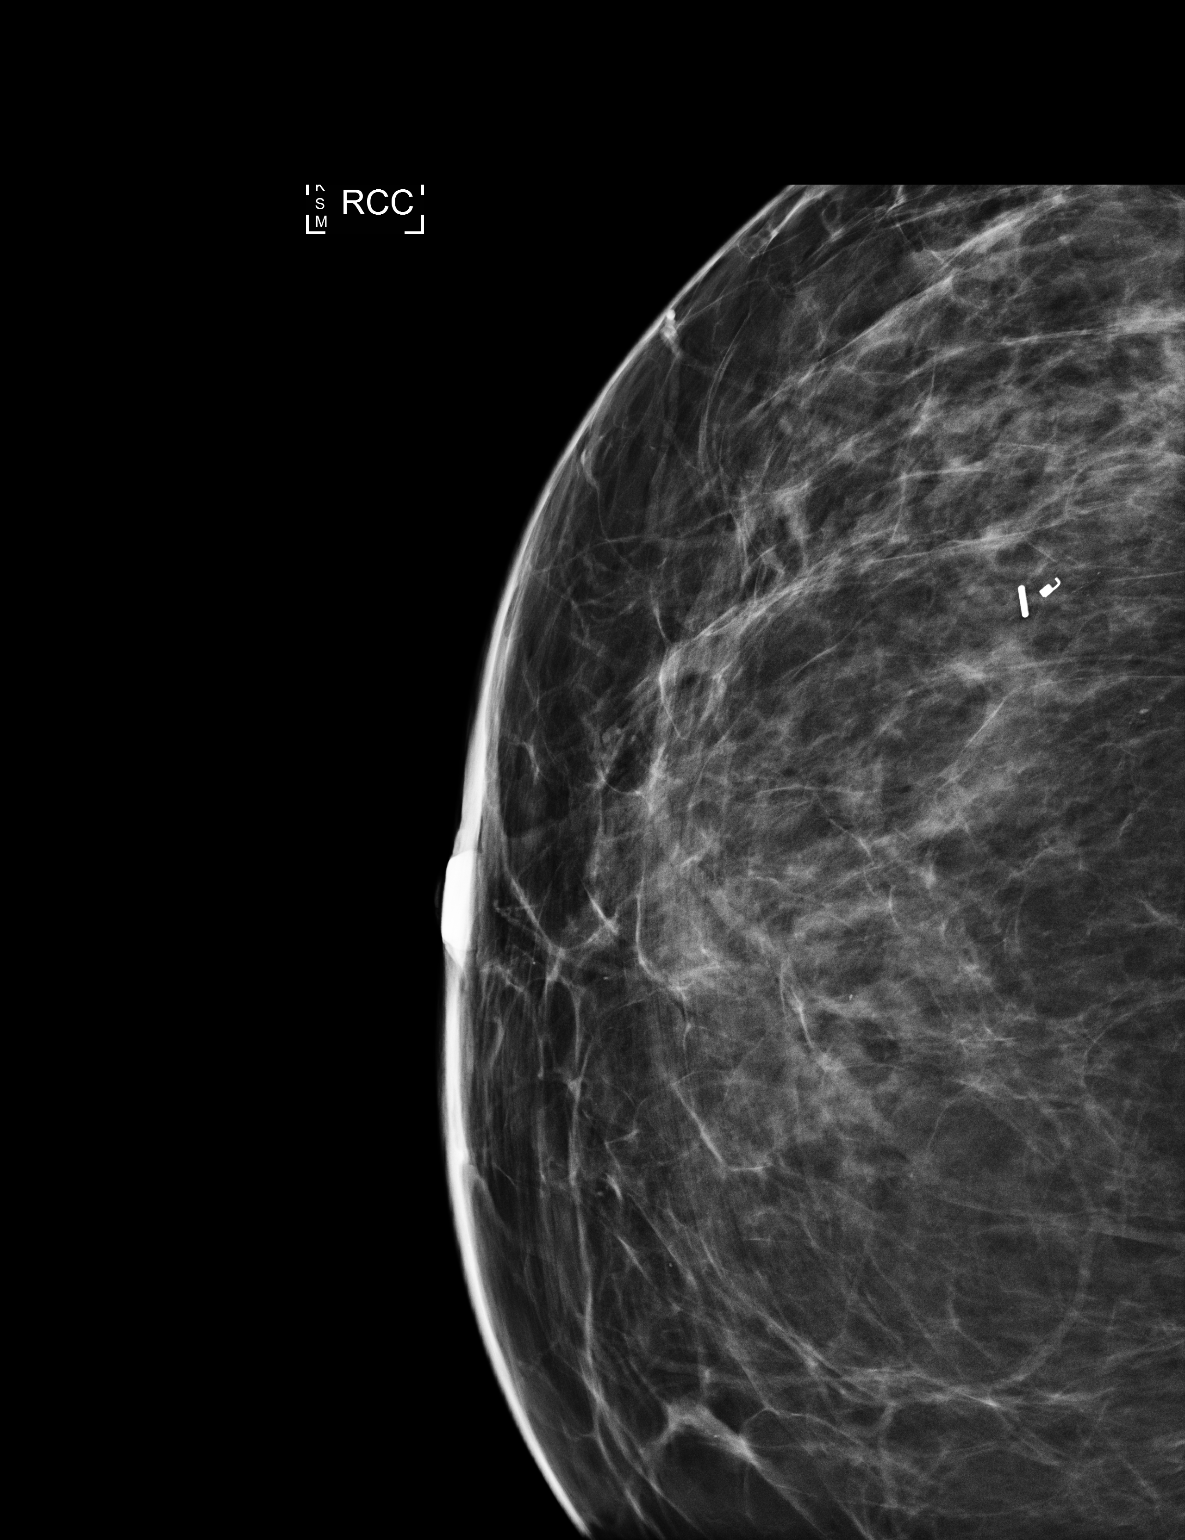

[R LM]
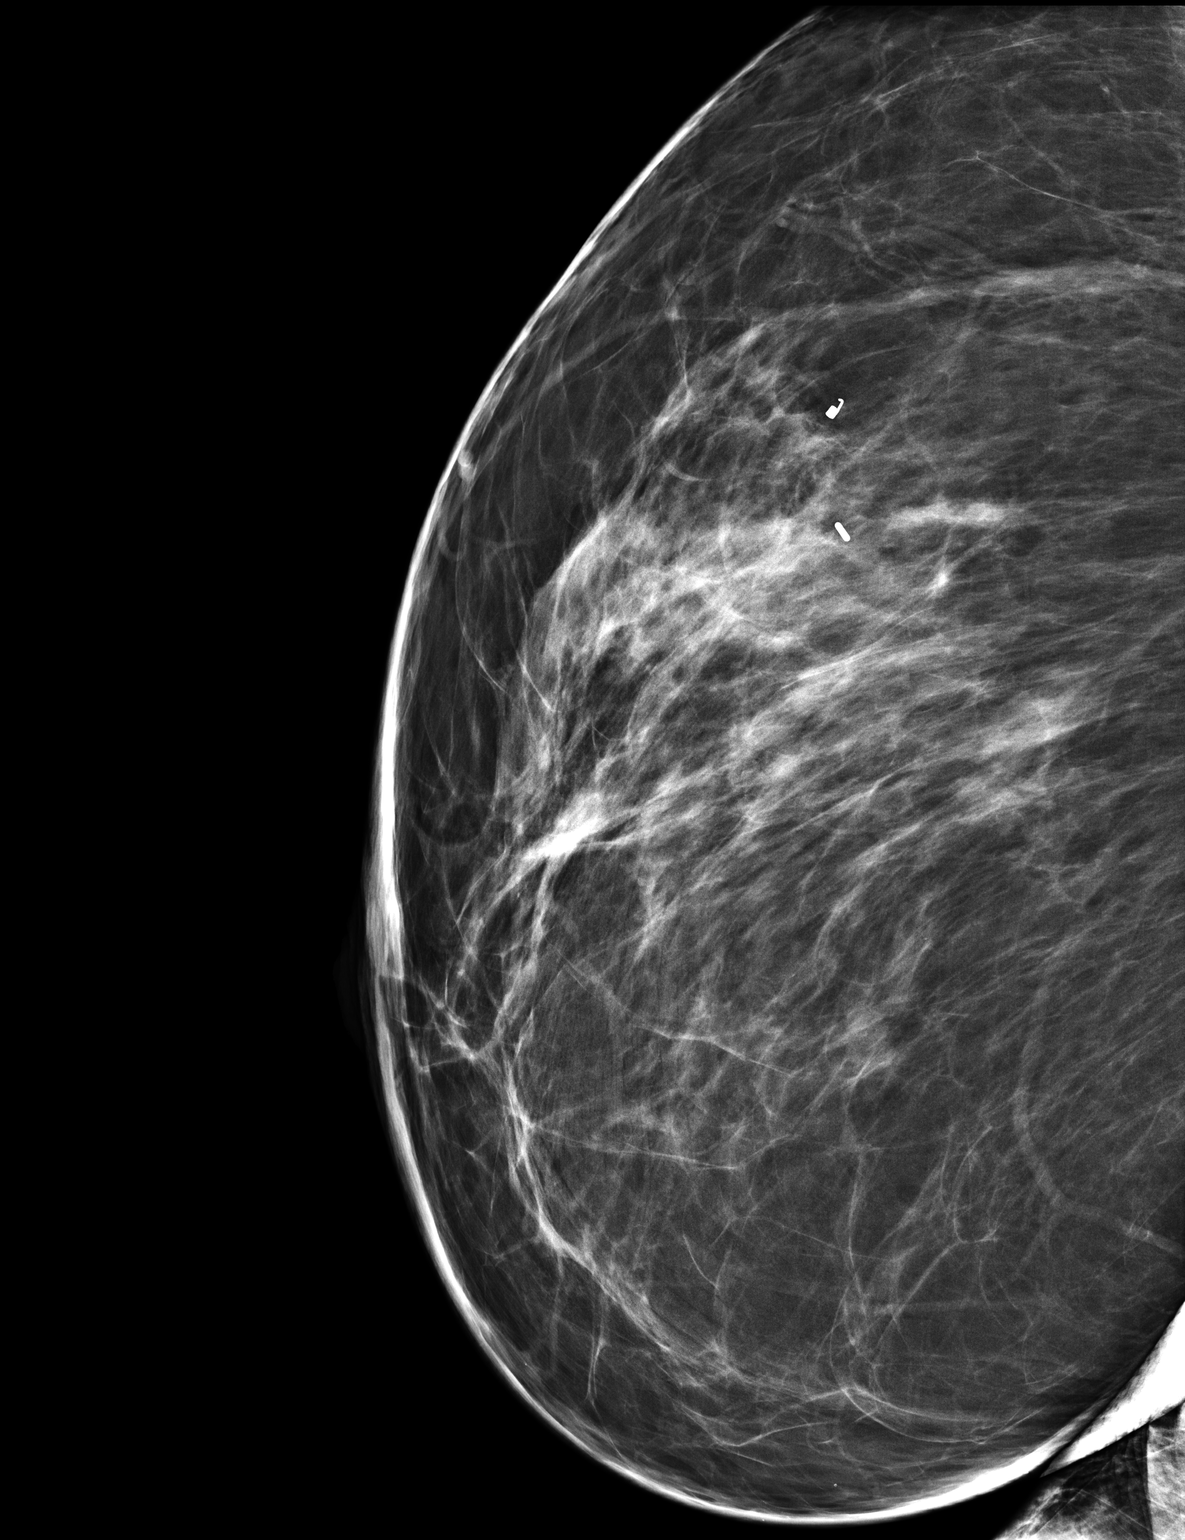

[3 of 3 positions shown; findings below may reference images not displayed]

FINDINGS: Patient presents for radioactive seed localization prior to breast
conservation surgery. I met with the patient and we discussed the
procedure of seed localization including benefits and alternatives.
We discussed the high likelihood of a successful procedure. We
discussed the risks of the procedure including infection, bleeding,
tissue injury and further surgery. We discussed the low dose of
radioactivity involved in the procedure. Informed, written consent
was given.

The usual time-out protocol was performed immediately prior to the
procedure.

Using mammographic guidance, sterile technique, 1% lidocaine and an
3-HJL radioactive seed, the residual calcifications within the RIGHT
breast, approximately 1 cm below the level of the coil shaped biopsy
clip, was localized using a lateral approach. The follow-up
mammogram images confirm the seed in the expected location and were
marked for Dr. Nya.

Follow-up survey of the patient confirms presence of the radioactive
seed.

Order number of 3-HJL seed:  [id number].

Total activity:  0.252 millicurie reference Date: 03/14/2019

The patient tolerated the procedure well and was released from the
[REDACTED]. She was given instructions regarding seed removal.
IMPRESSION: Radioactive seed localization right breast. No apparent
complications.

## 2019-11-18 IMAGING — DX MM BREAST SURGICAL SPECIMEN
1 series · 2 of 2 positions shown · non-contrast
Comparison: Previous exam(s).

CLINICAL DATA: Status post surgical excision following radioactive
seed localization of a right breast carcinoma.

EXAM:
SPECIMEN RADIOGRAPH OF THE RIGHT BREAST

[Series 2: specimen digital x-ray, derived · right · 2 of 2 slices shown]
[im 1/2]
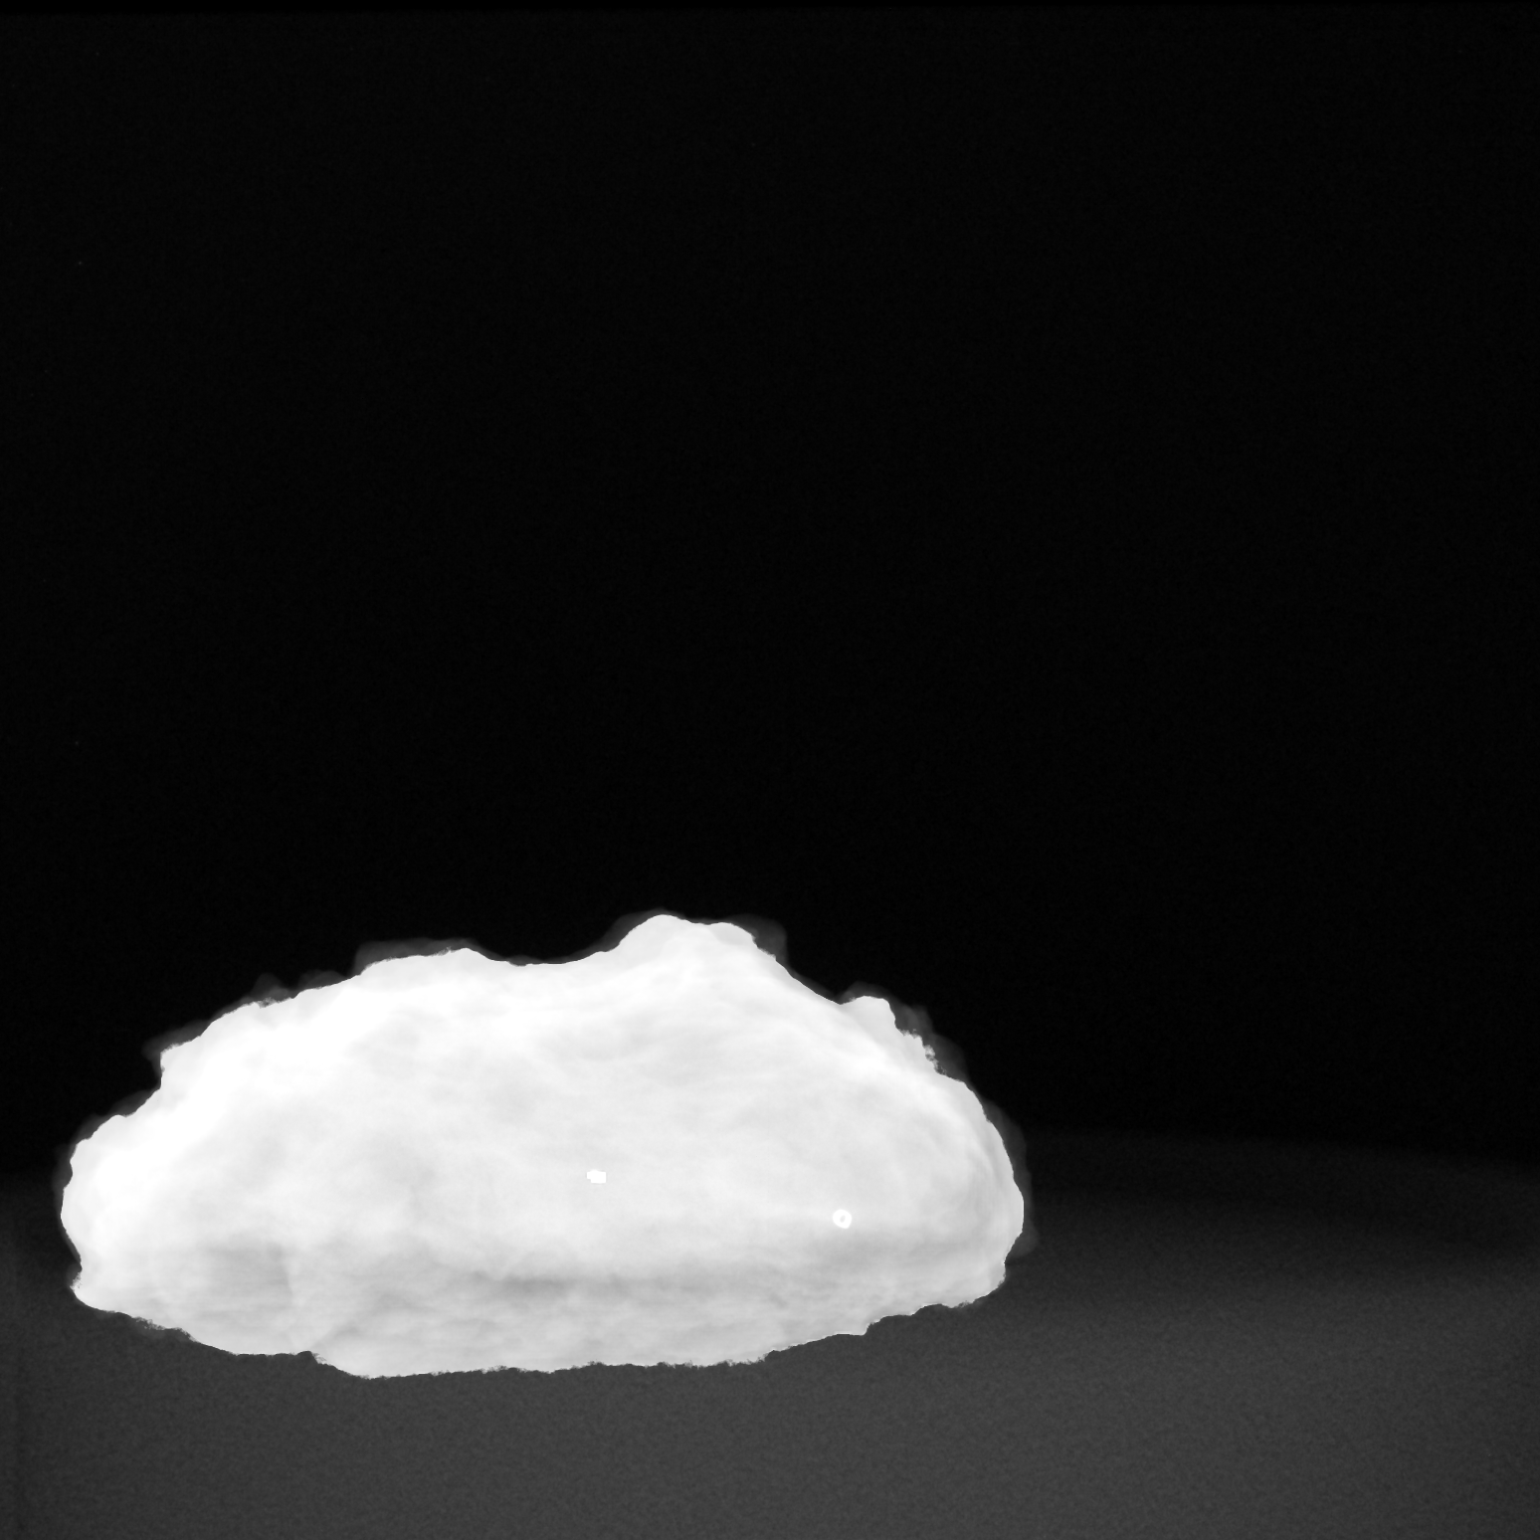
[im 2/2]
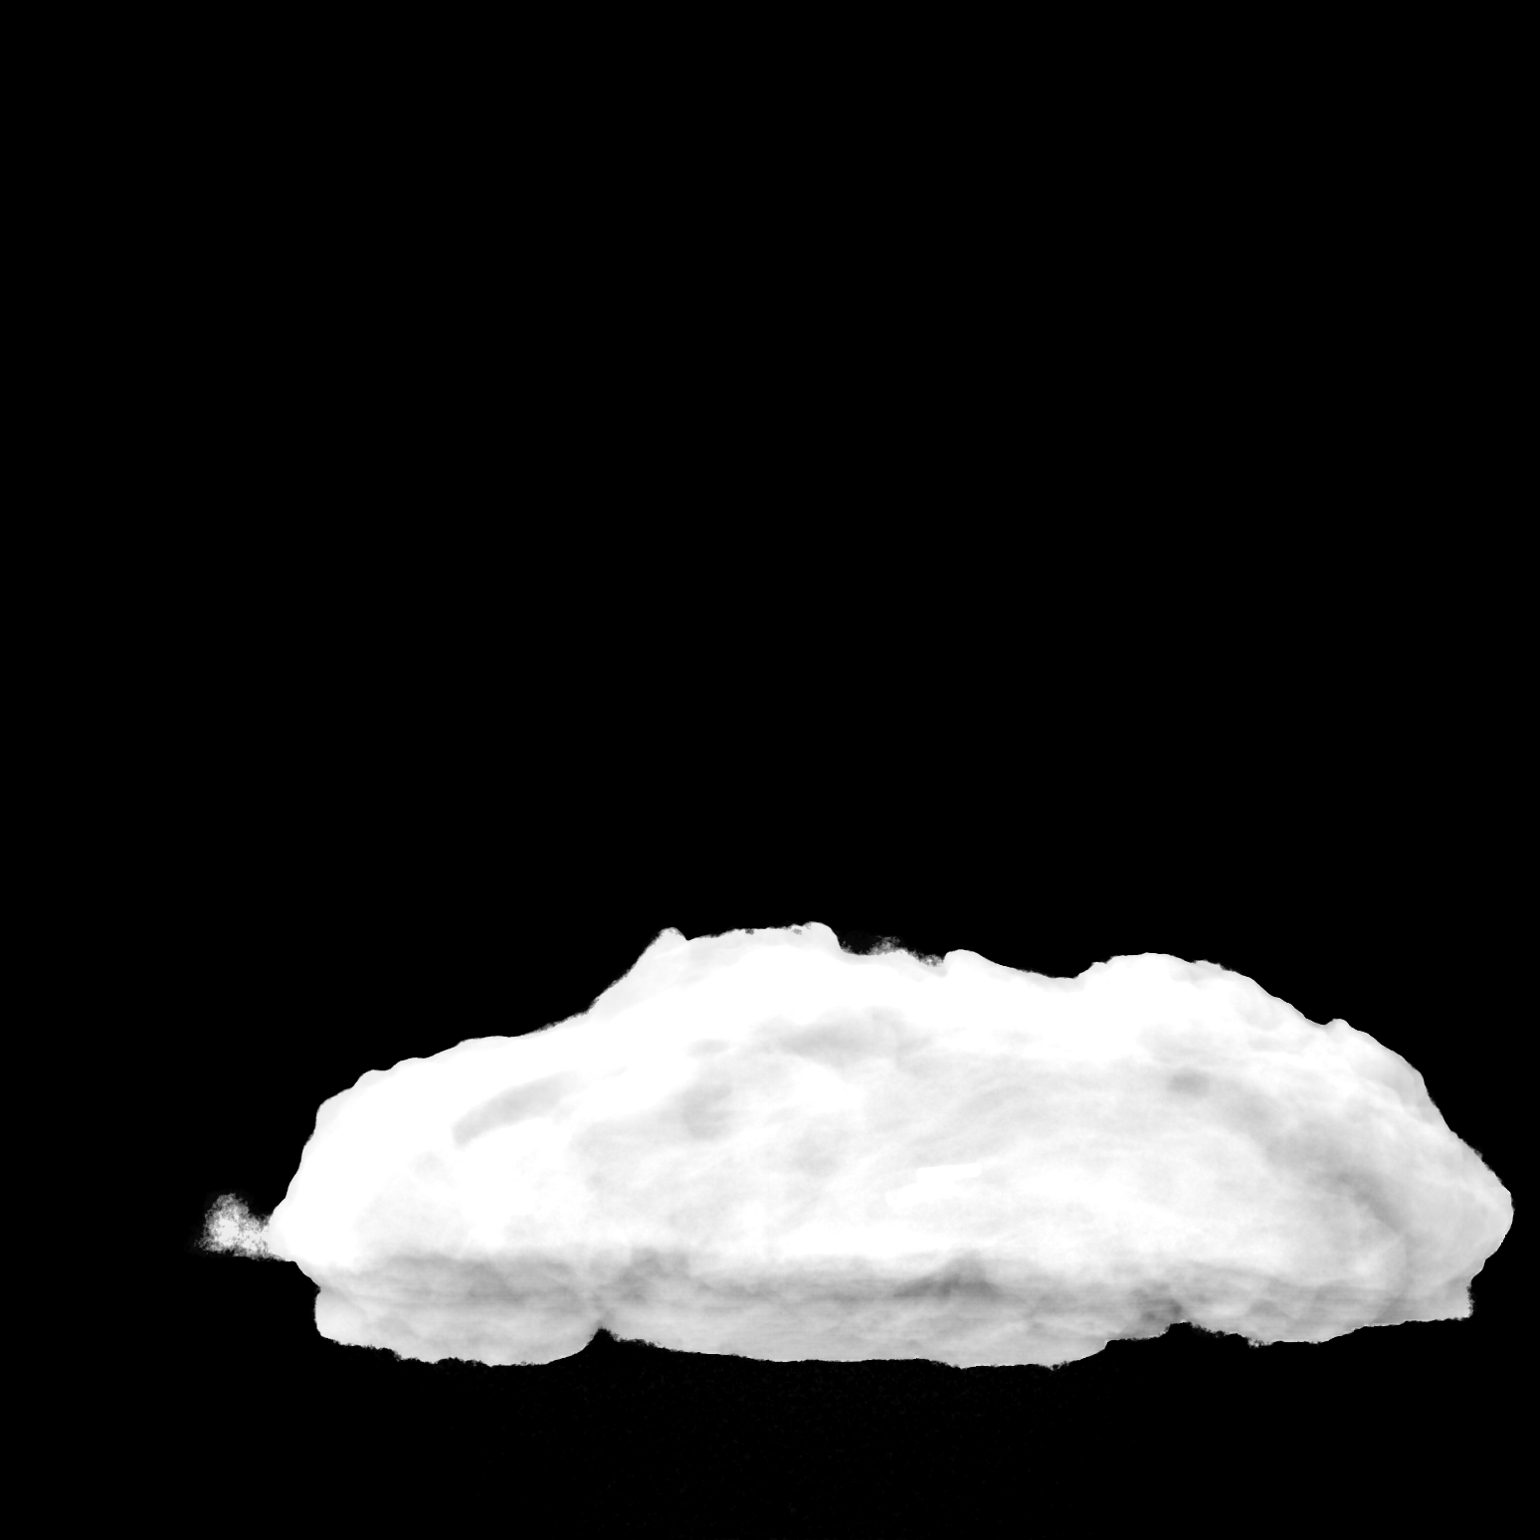

[2 of 2 positions shown; findings below may reference images not displayed]

FINDINGS: Status post excision of the right breast. The radioactive seed and
biopsy marker clip are present, completely intact, and were marked
for pathology.
IMPRESSION: Specimen radiograph of the right breast.

## 2019-11-25 ENCOUNTER — Other Ambulatory Visit: Payer: Self-pay | Admitting: Endocrinology

## 2019-12-16 ENCOUNTER — Encounter: Payer: Self-pay | Admitting: Adult Health

## 2019-12-20 ENCOUNTER — Other Ambulatory Visit: Payer: Self-pay | Admitting: Endocrinology

## 2020-02-24 ENCOUNTER — Ambulatory Visit
Admission: RE | Admit: 2020-02-24 | Discharge: 2020-02-24 | Disposition: A | Discharge status: Home / Self Care | Payer: 59 | Source: Ambulatory Visit | Attending: Adult Health | Admitting: Adult Health

## 2020-02-24 ENCOUNTER — Other Ambulatory Visit: Payer: Self-pay

## 2020-02-24 DIAGNOSIS — C50411 Malignant neoplasm of upper-outer quadrant of right female breast: Secondary | ICD-10-CM

## 2020-03-17 ENCOUNTER — Other Ambulatory Visit: Payer: Self-pay | Admitting: Endocrinology

## 2020-04-09 ENCOUNTER — Other Ambulatory Visit: Payer: Self-pay

## 2020-04-09 ENCOUNTER — Other Ambulatory Visit (INDEPENDENT_AMBULATORY_CARE_PROVIDER_SITE_OTHER): Payer: 59

## 2020-04-09 DIAGNOSIS — R7301 Impaired fasting glucose: Secondary | ICD-10-CM

## 2020-04-09 DIAGNOSIS — E782 Mixed hyperlipidemia: Secondary | ICD-10-CM | POA: Diagnosis not present

## 2020-04-09 DIAGNOSIS — E063 Autoimmune thyroiditis: Secondary | ICD-10-CM | POA: Diagnosis not present

## 2020-04-09 LAB — COMPREHENSIVE METABOLIC PANEL
ALT: 15 U/L (ref 0–35)
AST: 13 U/L (ref 0–37)
Albumin: 3.8 g/dL (ref 3.5–5.2)
Alkaline Phosphatase: 38 U/L — ABNORMAL LOW (ref 39–117)
BUN: 13 mg/dL (ref 6–23)
CO2: 28 mEq/L (ref 19–32)
Calcium: 9.1 mg/dL (ref 8.4–10.5)
Chloride: 103 mEq/L (ref 96–112)
Creatinine, Ser: 0.67 mg/dL (ref 0.40–1.20)
GFR: 93.85 mL/min (ref >60.00)
Glucose, Bld: 105 mg/dL — ABNORMAL HIGH (ref 70–99)
Potassium: 3.7 mEq/L (ref 3.5–5.1)
Sodium: 139 mEq/L (ref 135–145)
Total Bilirubin: 0.4 mg/dL (ref 0.2–1.2)
Total Protein: 6.5 g/dL (ref 6.0–8.3)

## 2020-04-09 LAB — LIPID PANEL
Cholesterol: 145 mg/dL (ref 0–200)
HDL: 28.4 mg/dL — ABNORMAL LOW (ref >39.00)
NonHDL: 116.64
Total CHOL/HDL Ratio: 5
Triglycerides: 262 mg/dL — ABNORMAL HIGH (ref 0.0–149.0)
VLDL: 52.4 mg/dL — ABNORMAL HIGH (ref 0.0–40.0)

## 2020-04-09 LAB — HEMOGLOBIN A1C: Hgb A1c MFr Bld: 5.1 % (ref 4.6–6.5)

## 2020-04-09 LAB — LDL CHOLESTEROL, DIRECT: Direct LDL: 73 mg/dL

## 2020-04-09 LAB — TSH: TSH: 1.77 u[IU]/mL (ref 0.35–4.50)

## 2020-04-09 LAB — T4, FREE: Free T4: 1.04 ng/dL (ref 0.60–1.60)

## 2020-04-12 ENCOUNTER — Telehealth (INDEPENDENT_AMBULATORY_CARE_PROVIDER_SITE_OTHER): Payer: 59 | Admitting: Endocrinology

## 2020-04-12 ENCOUNTER — Encounter: Payer: Self-pay | Admitting: Endocrinology

## 2020-04-12 ENCOUNTER — Other Ambulatory Visit: Payer: Self-pay

## 2020-04-12 DIAGNOSIS — R7301 Impaired fasting glucose: Secondary | ICD-10-CM

## 2020-04-12 DIAGNOSIS — E782 Mixed hyperlipidemia: Secondary | ICD-10-CM

## 2020-04-12 DIAGNOSIS — E282 Polycystic ovarian syndrome: Secondary | ICD-10-CM

## 2020-04-12 DIAGNOSIS — E063 Autoimmune thyroiditis: Secondary | ICD-10-CM

## 2020-04-12 MED ORDER — FENOFIBRATE 145 MG PO TABS: 145.0000 mg | ORAL_TABLET | Freq: Every day | ORAL | 1 refills | Status: DC

## 2020-04-12 NOTE — Progress Notes (Signed)
Patient ID: [name], female   DOB: Feb 02, 1971, 49 y.o.   MRN: [id number]    Chief complaint: Followup of various problems   History of Present Illness:  OBESITY:    In 8/16 she was started on Belviq and with this she had previously lost over 20 pounds with baseline weight of 258 However her insurance does not cover this or any other weight loss medications including Saxenda Did not tolerate low-dose phentermine  Her weight was increased further in 01/2019 and she was recommended Contrave However she did not get the prescription for Contrave filled likely because of the cost  She thinks her weight is about 242 now which is lower than before However it appears to have plateaued  As before is trying to eat more salads, baked foods, take lunches from home  She again has not done any formal exercise except some gardening activities and walking around the house Does not have a treadmill at home   Wt Readings from Last 3 Encounters:  10/13/19 249 lb 6.4 oz (113.1 kg)  07/11/19 253 lb 3.2 oz (114.9 kg)  05/05/19 247 lb (112 kg)    HYPERTENSION:  She has had hypertension since at least 2004. Has been on ACE inhibitor since about 2005 Because of swelling of her legs her ACE inhibitor was changed to low-dose Lotensin HCTZ and subsequently to hold lisinopril HCT 10/12.5  Blood pressure consistently well-controlled She monitors at home, recent reading 115/70   BP Readings from Last 3 Encounters:  10/13/19 126/70  07/11/19 (!) 147/91  04/20/19 125/79      IMPAIRED FASTING GLUCOSE:  Her glucose is again above 100 fasting Now 105 compared to 101 has been as high as 113 Has improved with her weight loss   She has been  on metformin ER 750 mg, taking 1 tablet twice daily   A1c has been below the pre-diabetic range consistently   Lab Results  Component Value Date   HGBA1C 5.1 04/09/2020   HGBA1C 5.3 10/18/2018   HGBA1C 5.3 01/18/2018   Lab Results    Component Value Date   LDLCALC 84 10/05/2019   CREATININE 0.67 04/09/2020    HYPOTHYROIDISM:  She has had a goiter since at least 1998. Baseline TSH was 5.4 when she was started on levothyroxine in 2000 and has required small doses of levothyroxine  She is complaining of fatigue for the last several months but has had other issues  Has not required a change in her 75 g dose for the last few years TSH again in the normal range at 1.8   Lab Results  Component Value Date   TSH 1.77 04/09/2020   TSH 3.12 10/05/2019   TSH 1.77 02/15/2019   FREET4 1.04 04/09/2020   FREET4 1.07 01/31/2016   FREET4 0.81 03/07/2014     HYPERLIPIDEMIA: She has had diabetic dyslipidemia with HDL as low as 23 and high triglycerides. Previously on Crestor and fenofibrate combination  She was on Crestor and 2/19 was on 10 mg but subsequently she did not refill her prescription Crestor was not restarted on her last visit since LDL was normal  Tends to have persistently high triglycerides and these are now higher at 262, last time were below 200  Lab Results  Component Value Date   CHOL 145 04/09/2020   CHOL 154 10/05/2019   CHOL 189 02/15/2019   Lab Results  Component Value Date   HDL 28.40 (L) 04/09/2020   HDL 35.50 (  L) 10/05/2019   HDL 40.60 02/15/2019   Lab Results  Component Value Date   LDLCALC 84 10/05/2019   LDLCALC 98 06/10/2018   LDLCALC 105 (H) 05/14/2017   Lab Results  Component Value Date   TRIG 262.0 (H) 04/09/2020   TRIG 172.0 (H) 10/05/2019   TRIG 217.0 (H) 02/15/2019   Lab Results  Component Value Date   CHOLHDL 5 04/09/2020   CHOLHDL 4 10/05/2019   CHOLHDL 5 02/15/2019   Lab Results  Component Value Date   LDLDIRECT 73.0 04/09/2020   LDLDIRECT 113.0 02/15/2019   LDLDIRECT 122.0 10/18/2018      Allergies as of 04/12/2020   No Known Allergies     Medication List       Accurate as of Apr 12, 2020  1:05 PM. If you have any questions, ask your nurse or  doctor.        diclofenac 75 MG EC tablet Commonly known as: VOLTAREN Take 75 mg by mouth 2 (two) times daily.   hydroxychloroquine 200 MG tablet Commonly known as: PLAQUENIL TAKE 1 TABLET BY MOUTH TWICE A DAY WITH FOOD OR MILK   ibuprofen 800 MG tablet Commonly known as: ADVIL Take 1 tablet (800 mg total) by mouth every 8 (eight) hours as needed.   lisinopril-hydrochlorothiazide 10-12.5 MG tablet Commonly known as: ZESTORETIC TAKE 1 TABLET BY MOUTH DAILY.   metFORMIN 750 MG 24 hr tablet Commonly known as: GLUCOPHAGE-XR Take 2 tablets daily   tamoxifen 20 MG tablet Commonly known as: NOLVADEX Take 1 tablet (20 mg total) by mouth daily.   Unithroid 75 MCG tablet Generic drug: levothyroxine TAKE 1 TAB BY MOUTH ONCE DAILY. REPLACES NAME BRAND SYNTHROID       Allergies: No Known Allergies  Past Medical History:  Diagnosis Date  . Arthritis    RA  . Breast cancer ([place]) 01/2019   Right Breast Cancer  . Family history of breast cancer   . Hypertension   . Hypothyroidism   . Personal history of radiation therapy 2020   Right Breast Cancer    Past Surgical History:  Procedure Laterality Date  . BREAST LUMPECTOMY Right 04/20/2019  . BREAST LUMPECTOMY WITH RADIOACTIVE SEED LOCALIZATION Right 04/20/2019   Procedure: RIGHT BREAST LUMPECTOMY WITH RADIOACTIVE SEED LOCALIZATION;  Surgeon: Erroll Luna, MD;  Location: [facility];  Service: General;  Laterality: Right;  . CESAREAN SECTION     x2    Family History  Problem Relation Age of Onset  . Thyroid disease Mother   . Diabetes Maternal Grandfather   . Hypertension Maternal Grandfather   . Diabetes Maternal Grandmother   . Breast cancer Paternal Grandmother     Social History:  reports that she has never smoked. She has never used smokeless tobacco. She reports previous alcohol use. She reports that she does not use drugs.  Review of Systems   She has been told to have rheumatoid arthritis,  On Plaquenil   History of PCOS: Her menstrual cycles are regular every month  Free testosterone was last normal  Labs:  Lab on 04/09/2020  Component Date Value Ref Range Status  . Free T4 04/09/2020 1.04  0.60 - 1.60 ng/dL Final   Comment: Specimens from patients who are undergoing biotin therapy and /or ingesting biotin supplements may contain high levels of biotin.  The higher biotin concentration in these specimens interferes with this Free T4 assay.  Specimens that contain high levels  of biotin may cause false high results  for this Free T4 assay.  Please interpret results in light of the total clinical presentation of the patient.    Marland Kitchen TSH 04/09/2020 1.77  0.35 - 4.50 uIU/mL Final  . Cholesterol 04/09/2020 145  0 - 200 mg/dL Final   ATP III Classification       Desirable:  < 200 mg/dL               Borderline High:  200 - 239 mg/dL          High:  > = 240 mg/dL  . Triglycerides 04/09/2020 262.0* 0.0 - 149.0 mg/dL Final   Normal:  <150 mg/dLBorderline High:  150 - 199 mg/dL  . HDL 04/09/2020 28.40* >39.00 mg/dL Final  . VLDL 04/09/2020 52.4* 0.0 - 40.0 mg/dL Final  . Total CHOL/HDL Ratio 04/09/2020 5   Final                  Men          Women1/2 Average Risk     3.4          3.3Average Risk          5.0          4.42X Average Risk          9.6          7.13X Average Risk          15.0          11.0                      . NonHDL 04/09/2020 116.64   Final   NOTE:  Non-HDL goal should be 30 mg/dL higher than patient's LDL goal (i.e. LDL goal of < 70 mg/dL, would have non-HDL goal of < 100 mg/dL)  . Sodium 04/09/2020 139  135 - 145 mEq/L Final  . Potassium 04/09/2020 3.7  3.5 - 5.1 mEq/L Final  . Chloride 04/09/2020 103  96 - 112 mEq/L Final  . CO2 04/09/2020 28  19 - 32 mEq/L Final  . Glucose, Bld 04/09/2020 105* 70 - 99 mg/dL Final  . BUN 04/09/2020 13  6 - 23 mg/dL Final  . Creatinine, Ser 04/09/2020 0.67  0.40 - 1.20 mg/dL Final  . Total Bilirubin 04/09/2020 0.4  0.2 - 1.2 mg/dL  Final  . Alkaline Phosphatase 04/09/2020 38* 39 - 117 U/L Final  . AST 04/09/2020 13  0 - 37 U/L Final  . ALT 04/09/2020 15  0 - 35 U/L Final  . Total Protein 04/09/2020 6.5  6.0 - 8.3 g/dL Final  . Albumin 04/09/2020 3.8  3.5 - 5.2 g/dL Final  . GFR 04/09/2020 93.85  >60.00 mL/min Final  . Calcium 04/09/2020 9.1  8.4 - 10.5 mg/dL Final  . Hgb A1c MFr Bld 04/09/2020 5.1  4.6 - 6.5 % Final   Glycemic Control Guidelines for People with Diabetes:Non Diabetic:  <6%Goal of Therapy: <7%Additional Action Suggested:  >8%   . Direct LDL 04/09/2020 73.0  mg/dL Final   Optimal:  <100 mg/dLNear or Above Optimal:  100-129 mg/dLBorderline High:  130-159 mg/dLHigh:  160-189 mg/dLVery High:  >190 mg/dL      EXAM:  There were no vitals taken for this visit.     Assessment/Plan:   OBESITY:  She has managed to lose weight gradually but recently appears to have plateaued Her diet is managed well usually Has not started an exercise regimen as discussed before Since she  cannot afford weight loss medications will defer any treatment However encouraged her to start home exercise regimen with online videos and continue diet Consider Contrave if not able to lose weight  Impaired fasting glucose: Still has mildly increased fasting readings at 105 As before A1c is in the normal range again She will continue Metformin which she is also taking for PCOS Discussed need for weight loss  Hypothyroidism: Well controlled with 75 mcg Continue same dose   LIPIDS: Triglycerides are significantly higher fasting and nearly 300 LDL has been good even without taking rosuvastatin that she had taken before She will go back to fenofibrate 145 mg daily and have fasting labs checked again in 3 months  HYPERTENSION: This is well-controlled on Zestoretic and home readings are good also  History of PCOS: Controlled and her menstrual cycles are regular with continuing Metformin   There are no Patient Instructions on  file for this visit.     Elayne Snare 04/12/2020, 1:05 PM

## 2020-05-09 ENCOUNTER — Other Ambulatory Visit: Payer: Self-pay | Admitting: Endocrinology

## 2020-06-28 ENCOUNTER — Other Ambulatory Visit: Payer: Self-pay | Admitting: Hematology and Oncology

## 2020-07-05 ENCOUNTER — Other Ambulatory Visit: Payer: Self-pay | Admitting: Endocrinology

## 2020-08-02 ENCOUNTER — Other Ambulatory Visit: Payer: Self-pay | Admitting: Endocrinology

## 2020-08-03 ENCOUNTER — Other Ambulatory Visit: Payer: Self-pay | Admitting: Endocrinology

## 2020-09-20 ENCOUNTER — Other Ambulatory Visit: Payer: Self-pay | Admitting: Endocrinology

## 2020-09-20 ENCOUNTER — Other Ambulatory Visit (INDEPENDENT_AMBULATORY_CARE_PROVIDER_SITE_OTHER): Payer: 59

## 2020-09-20 ENCOUNTER — Other Ambulatory Visit: Payer: Self-pay

## 2020-09-20 DIAGNOSIS — E782 Mixed hyperlipidemia: Secondary | ICD-10-CM | POA: Diagnosis not present

## 2020-09-20 DIAGNOSIS — E282 Polycystic ovarian syndrome: Secondary | ICD-10-CM

## 2020-09-20 LAB — LIPID PANEL
Cholesterol: 169 mg/dL (ref 0–200)
HDL: 45.9 mg/dL (ref >39.00)
LDL Cholesterol: 97 mg/dL (ref 0–99)
NonHDL: 123.11
Total CHOL/HDL Ratio: 4
Triglycerides: 132 mg/dL (ref 0.0–149.0)
VLDL: 26.4 mg/dL (ref 0.0–40.0)

## 2020-09-21 LAB — COMPREHENSIVE METABOLIC PANEL
ALT: 19 U/L (ref 0–35)
AST: 17 U/L (ref 0–37)
Albumin: 4 g/dL (ref 3.5–5.2)
Alkaline Phosphatase: 24 U/L — ABNORMAL LOW (ref 39–117)
BUN: 17 mg/dL (ref 6–23)
CO2: 29 mEq/L (ref 19–32)
Calcium: 9.5 mg/dL (ref 8.4–10.5)
Chloride: 103 mEq/L (ref 96–112)
Creatinine, Ser: 0.79 mg/dL (ref 0.40–1.20)
GFR: 92 mL/min (ref >60.00)
Glucose, Bld: 91 mg/dL (ref 70–99)
Potassium: 4.2 mEq/L (ref 3.5–5.1)
Sodium: 138 mEq/L (ref 135–145)
Total Bilirubin: 0.4 mg/dL (ref 0.2–1.2)
Total Protein: 6.4 g/dL (ref 6.0–8.3)

## 2020-09-25 ENCOUNTER — Ambulatory Visit (INDEPENDENT_AMBULATORY_CARE_PROVIDER_SITE_OTHER): Payer: 59 | Admitting: Endocrinology

## 2020-09-25 ENCOUNTER — Other Ambulatory Visit: Payer: Self-pay

## 2020-09-25 VITALS — BP 130/76 | HR 94 | Ht 62.5 in | Wt 259.3 lb

## 2020-09-25 DIAGNOSIS — E063 Autoimmune thyroiditis: Secondary | ICD-10-CM | POA: Diagnosis not present

## 2020-09-25 DIAGNOSIS — E782 Mixed hyperlipidemia: Secondary | ICD-10-CM

## 2020-09-25 DIAGNOSIS — I10 Essential (primary) hypertension: Secondary | ICD-10-CM

## 2020-09-25 DIAGNOSIS — R7301 Impaired fasting glucose: Secondary | ICD-10-CM | POA: Diagnosis not present

## 2020-09-25 MED ORDER — OZEMPIC (0.25 OR 0.5 MG/DOSE) 2 MG/1.5ML ~~LOC~~ SOPN: 0.5000 mg | PEN_INJECTOR | SUBCUTANEOUS | 2 refills | Status: DC

## 2020-09-25 NOTE — Progress Notes (Signed)
Patient ID: Nicole Schwartz, female   DOB: 24-Aug-1971, 49 y.o.   MRN: [id number]    Chief complaint: Followup of various problems   History of Present Illness:  OBESITY:    In 8/16 she was started on Belviq and with this she had previously lost over 20 pounds with baseline weight of 258 However her insurance does not cover this or any other weight loss medications including Saxenda Did not tolerate low-dose phentermine  Her weight was increased further in 01/2019 and she was recommended Contrave However she did not get the prescription for Contrave filled because of the cost  Not think she has changed her diet with getting mostly baked foods, take lunches from home and not excessive snacks  She has done a little exercise with her bike for walking However appears to have gained at least 10 pounds of weight since her last visit   Wt Readings from Last 3 Encounters:  09/25/20 259 lb 4.8 oz (117.6 kg)  10/13/19 249 lb 6.4 oz (113.1 kg)  07/11/19 253 lb 3.2 oz (114.9 kg)    HYPERTENSION:  She has had hypertension since at least 2004. Has been on ACE inhibitor since about 2005 Because of swelling of her legs her ACE inhibitor was changed to low-dose Lotensin HCTZ and subsequently to hold lisinopril HCT 10/12.5  Blood pressure consistently well-controlled Periodically checking at home also   BP Readings from Last 3 Encounters:  09/25/20 130/76  10/13/19 126/70  07/11/19 (!) 147/91      IMPAIRED FASTING GLUCOSE:  Her glucose is better at 91, previously 105 Fasting glucose has been as high as 113   She has been  on metformin ER 750 mg, taking 1 tablet twice daily   A1c has been below the pre-diabetic range consistently   Lab Results  Component Value Date   HGBA1C 5.1 04/09/2020   HGBA1C 5.3 10/18/2018   HGBA1C 5.3 01/18/2018   Lab Results  Component Value Date   LDLCALC 97 09/20/2020   CREATININE 0.79 09/20/2020    HYPOTHYROIDISM:  She has had  a goiter since at least 1998. Baseline TSH was 5.4 when she was started on levothyroxine in 2000 and has required small doses of levothyroxine  She tends to have fatigue for multiple reasons and this is not any different lately  Has not required a change in her 75 g dose for the last few years TSH last in the normal range at 1.8   Lab Results  Component Value Date   TSH 1.77 04/09/2020   TSH 3.12 10/05/2019   TSH 1.77 02/15/2019   FREET4 1.04 04/09/2020   FREET4 1.07 01/31/2016   FREET4 0.81 03/07/2014     HYPERLIPIDEMIA: She has had diabetic dyslipidemia with HDL as low as 23 and high triglycerides. Previously on Crestor and fenofibrate combination  She was on Crestor and 2/19 was on 10 mg but subsequently she did not refill her prescription Crestor was not restarted since her LDL was normal  Tends to have persistently high triglycerides and these are now back to normal with restarting fenofibrate 145 mg daily  LDL 97 now  Lab Results  Component Value Date   CHOL 169 09/20/2020   CHOL 145 04/09/2020   CHOL 154 10/05/2019   Lab Results  Component Value Date   HDL 45.90 09/20/2020   HDL 28.40 (L) 04/09/2020   HDL 35.50 (L) 10/05/2019   Lab Results  Component Value Date   LDLCALC 97 09/20/2020  LDLCALC 84 10/05/2019   LDLCALC 98 06/10/2018   Lab Results  Component Value Date   TRIG 132.0 09/20/2020   TRIG 262.0 (H) 04/09/2020   TRIG 172.0 (H) 10/05/2019   Lab Results  Component Value Date   CHOLHDL 4 09/20/2020   CHOLHDL 5 04/09/2020   CHOLHDL 4 10/05/2019   Lab Results  Component Value Date   LDLDIRECT 73.0 04/09/2020   LDLDIRECT 113.0 02/15/2019   LDLDIRECT 122.0 10/18/2018      Allergies as of 09/25/2020   No Known Allergies     Medication List       Accurate as of September 25, 2020  4:36 PM. If you have any questions, ask your nurse or doctor.        diclofenac 75 MG EC tablet Commonly known as: VOLTAREN Take 75 mg by mouth 2 (two)  times daily.   fenofibrate 145 MG tablet Commonly known as: TRICOR Take 1 tablet (145 mg total) by mouth daily.   hydroxychloroquine 200 MG tablet Commonly known as: PLAQUENIL TAKE 1 TABLET BY MOUTH TWICE A DAY WITH FOOD OR MILK   ibuprofen 800 MG tablet Commonly known as: ADVIL Take 1 tablet (800 mg total) by mouth every 8 (eight) hours as needed.   lisinopril-hydrochlorothiazide 10-12.5 MG tablet Commonly known as: ZESTORETIC TAKE 1 TABLET BY MOUTH DAILY.   metFORMIN 750 MG 24 hr tablet Commonly known as: GLUCOPHAGE-XR Take 2 tablets by mouth daily.   tamoxifen 20 MG tablet Commonly known as: NOLVADEX TAKE 1 TABLET BY MOUTH EVERY DAY   Unithroid 75 MCG tablet Generic drug: levothyroxine TAKE 1 TAB BY MOUTH ONCE DAILY. REPLACES NAME BRAND SYNTHROID       Allergies: No Known Allergies  Past Medical History:  Diagnosis Date  . Arthritis    RA  . Breast cancer ([place]) 01/2019   Right Breast Cancer  . Family history of breast cancer   . Hypertension   . Hypothyroidism   . Personal history of radiation therapy 2020   Right Breast Cancer    Past Surgical History:  Procedure Laterality Date  . BREAST LUMPECTOMY Right 04/20/2019  . BREAST LUMPECTOMY WITH RADIOACTIVE SEED LOCALIZATION Right 04/20/2019   Procedure: RIGHT BREAST LUMPECTOMY WITH RADIOACTIVE SEED LOCALIZATION;  Surgeon: Erroll Luna, MD;  Location: [facility];  Service: General;  Laterality: Right;  . CESAREAN SECTION     x2    Family History  Problem Relation Age of Onset  . Thyroid disease Mother   . Diabetes Maternal Grandfather   . Hypertension Maternal Grandfather   . Diabetes Maternal Grandmother   . Breast cancer Paternal Grandmother     Social History:  reports that she has never smoked. She has never used smokeless tobacco. She reports previous alcohol use. She reports that she does not use drugs.  Review of Systems   She has been told to have rheumatoid arthritis, On  Plaquenil   History of PCOS: Her menstrual cycles previously had been regular but not she has not had any for 2 months  Free testosterone was not done on this visit as she did not have a proper tube collected  Labs:  Lab on 09/20/2020  Component Date Value Ref Range Status  . Sodium 09/20/2020 138  135 - 145 mEq/L Final  . Potassium 09/20/2020 4.2  3.5 - 5.1 mEq/L Final  . Chloride 09/20/2020 103  96 - 112 mEq/L Final  . CO2 09/20/2020 29  19 - 32 mEq/L Final  .  Glucose, Bld 09/20/2020 91  70 - 99 mg/dL Final  . BUN 09/20/2020 17  6 - 23 mg/dL Final  . Creatinine, Ser 09/20/2020 0.79  0.40 - 1.20 mg/dL Final  . Total Bilirubin 09/20/2020 0.4  0.2 - 1.2 mg/dL Final  . Alkaline Phosphatase 09/20/2020 24* 39 - 117 U/L Final  . AST 09/20/2020 17  0 - 37 U/L Final  . ALT 09/20/2020 19  0 - 35 U/L Final  . Total Protein 09/20/2020 6.4  6.0 - 8.3 g/dL Final  . Albumin 09/20/2020 4.0  3.5 - 5.2 g/dL Final  . GFR 09/20/2020 92.00  >60.00 mL/min Final  . Calcium 09/20/2020 9.5  8.4 - 10.5 mg/dL Final  . Cholesterol 09/20/2020 169  0 - 200 mg/dL Final   ATP III Classification       Desirable:  < 200 mg/dL               Borderline High:  200 - 239 mg/dL          High:  > = 240 mg/dL  . Triglycerides 09/20/2020 132.0  0 - 149 mg/dL Final   Normal:  <150 mg/dLBorderline High:  150 - 199 mg/dL  . HDL 09/20/2020 45.90  >39.00 mg/dL Final  . VLDL 09/20/2020 26.4  0.0 - 40.0 mg/dL Final  . LDL Cholesterol 09/20/2020 97  0 - 99 mg/dL Final  . Total CHOL/HDL Ratio 09/20/2020 4   Final                  Men          Women1/2 Average Risk     3.4          3.3Average Risk          5.0          4.42X Average Risk          9.6          7.13X Average Risk          15.0          11.0                      . NonHDL 09/20/2020 123.11   Final   NOTE:  Non-HDL goal should be 30 mg/dL higher than patient's LDL goal (i.e. LDL goal of < 70 mg/dL, would have non-HDL goal of < 100 mg/dL)      EXAM:  BP 130/76  (BP Location: Left Arm, Patient Position: Sitting, Cuff Size: Normal)   Pulse 94   Ht 5' 2.5" (1.588 m)   Wt 259 lb 4.8 oz (117.6 kg)   SpO2 97%   BMI 46.67 kg/m      Assessment/Plan:   OBESITY:  She has again gained weight although this may be partly related to taking prednisone a couple months ago Likely cannot afford any weight loss medication as these are not covered by her insurance again  Although she thinks she is doing fairly well with her diet and doing some exercise not clear why her weight continues to go up She does have metabolic syndrome  Because of her impaired fasting glucose she will benefit from a GLP-1 drug Explained to the patient what GLP-1 drugs are, the sites of actions and the body, reduction of hunger sensation and improved insulin secretion.  Discussed the benefit of weight loss with these medications. Explained possible side effects of OZEMPIC, most commonly nausea that usually improves over  time.  Also explained safety information associated with the medication Demonstrated the medication injection device and injection technique to the patient.  To start the injections with the 0.25 mg dosage weekly for the first 4 injections and then go up to 0.5 mg weekly if no continued nausea  Patient brochure on Ozempic with enclosed co-pay card given Her husband will help her do the injection since he is himself taking this  Impaired fasting glucose: Better with glucose below 100 now, usually A1c normal  Hypothyroidism: Well controlled with 75 mcg Continue same dose until next visit when she will have repeat labs   LIPIDS: Triglycerides are better with fenofibrate even without weight loss   HYPERTENSION: This is well-controlled on Zestoretic which she will continue  History of PCOS: We will check free testosterone level on the next visit as she is having oligomenorrhea recently, this may be partly related to weight gain   There are no Patient Instructions on  file for this visit.     Elayne Snare 09/25/2020, 4:36 PM

## 2020-09-25 NOTE — Patient Instructions (Signed)
Start OZEMPIC injections by dialing 0.25 mg on the pen as shown once weekly on the same day of the week.   You may inject in the sides of the stomach, outer thigh or arm as indicated in the brochure given. If you have any difficulties using the pen see the video at [email or web address]  You will feel fullness of the stomach with starting the medication and should try to keep the portions at meals small.  You may experience nausea in the first few days which usually gets better over time    After 4 weeks increase the dose to 0.5 mg weekly  If you have any questions or persistent side effects please call the office   You may also talk to a nurse educator with [employer] at [phone] Useful website: [email or web address]     

## 2020-09-26 ENCOUNTER — Other Ambulatory Visit: Payer: Self-pay | Admitting: *Deleted

## 2020-09-26 MED ORDER — OZEMPIC (0.25 OR 0.5 MG/DOSE) 2 MG/1.5ML ~~LOC~~ SOPN: 0.5000 mg | PEN_INJECTOR | SUBCUTANEOUS | 2 refills | Status: DC

## 2020-09-30 ENCOUNTER — Other Ambulatory Visit: Payer: Self-pay | Admitting: Endocrinology

## 2020-10-02 ENCOUNTER — Telehealth: Payer: Self-pay | Admitting: *Deleted

## 2020-10-29 ENCOUNTER — Other Ambulatory Visit: Payer: Self-pay | Admitting: Endocrinology

## 2020-11-01 ENCOUNTER — Telehealth: Payer: Self-pay | Admitting: Endocrinology

## 2020-11-01 NOTE — Telephone Encounter (Signed)
My records indicate that she did not tolerate phentermine.  She could try taking Alli OTC if she wants to try something

## 2020-11-01 NOTE — Telephone Encounter (Signed)
Please see below.

## 2020-11-01 NOTE — Telephone Encounter (Signed)
Patient wanted to let Dr Dwyane Dee know her insurance won't cover ozempic, and she wanted to know if he could call in phentermine as an alternative? Please advise.

## 2020-11-02 ENCOUNTER — Other Ambulatory Visit: Payer: Self-pay | Admitting: Endocrinology

## 2020-11-07 ENCOUNTER — Other Ambulatory Visit: Payer: Self-pay | Admitting: Endocrinology

## 2020-11-07 MED ORDER — PHENTERMINE HCL 15 MG PO CAPS: 15.0000 mg | ORAL_CAPSULE | ORAL | 2 refills | Status: DC

## 2020-11-07 NOTE — Telephone Encounter (Signed)
Prescription has been sent for 15 mg phentermine

## 2020-11-07 NOTE — Telephone Encounter (Signed)
Spoke with patient and she would like to try phentermine again.

## 2020-11-08 NOTE — Telephone Encounter (Signed)
Spoken to patient and notified Rx was sent. Also patient asked Dr Dwyane Dee if she should keep her appointments in a couple weeks or leave it?

## 2020-11-08 NOTE — Telephone Encounter (Signed)
Message left for patient to return my call.  

## 2020-11-08 NOTE — Telephone Encounter (Signed)
She should delay her appointment by about a month so we can see how the phentermine works

## 2020-11-08 NOTE — Telephone Encounter (Signed)
Spoken to patient and notified Dr Ronnie Derby comments. Patient will call the office back to reschedule appointment

## 2020-11-14 ENCOUNTER — Other Ambulatory Visit: Payer: 59

## 2020-11-27 ENCOUNTER — Ambulatory Visit: Payer: 59 | Admitting: Endocrinology

## 2020-12-14 ENCOUNTER — Other Ambulatory Visit (INDEPENDENT_AMBULATORY_CARE_PROVIDER_SITE_OTHER): Payer: 59

## 2020-12-14 ENCOUNTER — Other Ambulatory Visit: Payer: Self-pay

## 2020-12-14 DIAGNOSIS — E782 Mixed hyperlipidemia: Secondary | ICD-10-CM | POA: Diagnosis not present

## 2020-12-14 DIAGNOSIS — E063 Autoimmune thyroiditis: Secondary | ICD-10-CM

## 2020-12-14 DIAGNOSIS — E282 Polycystic ovarian syndrome: Secondary | ICD-10-CM

## 2020-12-14 DIAGNOSIS — I10 Essential (primary) hypertension: Secondary | ICD-10-CM

## 2020-12-14 DIAGNOSIS — R7301 Impaired fasting glucose: Secondary | ICD-10-CM

## 2020-12-14 LAB — BASIC METABOLIC PANEL
BUN: 16 mg/dL (ref 6–23)
CO2: 25 mEq/L (ref 19–32)
Calcium: 9.7 mg/dL (ref 8.4–10.5)
Chloride: 103 mEq/L (ref 96–112)
Creatinine, Ser: 0.75 mg/dL (ref 0.40–1.20)
GFR: 93.82 mL/min (ref >60.00)
Glucose, Bld: 94 mg/dL (ref 70–99)
Potassium: 3.7 mEq/L (ref 3.5–5.1)
Sodium: 140 mEq/L (ref 135–145)

## 2020-12-14 LAB — T4, FREE: Free T4: 1.25 ng/dL (ref 0.60–1.60)

## 2020-12-14 LAB — HEMOGLOBIN A1C: Hgb A1c MFr Bld: 5 % (ref 4.6–6.5)

## 2020-12-14 LAB — TSH: TSH: 1.69 u[IU]/mL (ref 0.35–4.50)

## 2020-12-16 LAB — TESTOSTERONE, FREE, TOTAL, SHBG
Sex Hormone Binding: 55 nmol/L (ref 24.6–122.0)
Testosterone, Free: 4.4 pg/mL — ABNORMAL HIGH (ref 0.0–4.2)
Testosterone: 34 ng/dL (ref 4–50)

## 2020-12-20 ENCOUNTER — Other Ambulatory Visit: Payer: Self-pay

## 2020-12-20 ENCOUNTER — Encounter: Payer: Self-pay | Admitting: Endocrinology

## 2020-12-20 ENCOUNTER — Ambulatory Visit: Payer: 59 | Admitting: Endocrinology

## 2020-12-20 VITALS — BP 130/68 | HR 98 | Ht 63.0 in | Wt 250.6 lb

## 2020-12-20 DIAGNOSIS — E282 Polycystic ovarian syndrome: Secondary | ICD-10-CM | POA: Diagnosis not present

## 2020-12-20 DIAGNOSIS — E063 Autoimmune thyroiditis: Secondary | ICD-10-CM | POA: Diagnosis not present

## 2020-12-20 DIAGNOSIS — R7301 Impaired fasting glucose: Secondary | ICD-10-CM

## 2020-12-20 DIAGNOSIS — E782 Mixed hyperlipidemia: Secondary | ICD-10-CM

## 2020-12-20 MED ORDER — TOPIRAMATE 25 MG PO TABS: 25.0000 mg | ORAL_TABLET | Freq: Every day | ORAL | 2 refills | Status: DC

## 2020-12-20 NOTE — Patient Instructions (Signed)
Take 3 Metformin daily

## 2020-12-20 NOTE — Progress Notes (Signed)
Patient ID: Nicole Schwartz, female   DOB: 1971-03-10, 50 y.o.   MRN: [id number]    Chief complaint: Followup of various problems   History of Present Illness:  OBESITY:    In 8/16 she was started on Belviq and with this she had previously lost over 20 pounds with baseline weight of 258 However her insurance does not cover this or any other weight loss medications including Saxenda Did not tolerate low-dose phentermine  Her weight was increased further in 01/2019 and she was recommended Contrave However she did not get the prescription for Contrave filled because of the cost Ozempic was denied by her insurance despite history of impaired fasting glucose  She is now taking phentermine since 11/07/2020 She feels a little jittery with this but she thinks she can tolerate the anxiety feeling However it is helping her cut back on her appetite and she is able to keep portions and snacks No  Also she has changed her diet with getting more baked foods, now using air Rolly Salter at home  Periodically will try to exercise with her bike or walking  With these changes she has nearly lost the 10 pounds that she had gained previously   Wt Readings from Last 3 Encounters:  12/20/20 250 lb 9.6 oz (113.7 kg)  09/25/20 259 lb 4.8 oz (117.6 kg)  10/13/19 249 lb 6.4 oz (113.1 kg)    HYPERTENSION:  She has had hypertension since at least 2004. Has been on ACE inhibitor since about 2005 Blood pressure is consistently controlled with lisinopril HCTZ  Periodically checking at home also   BP Readings from Last 3 Encounters:  12/20/20 130/68  09/25/20 130/76  10/13/19 126/70      IMPAIRED FASTING GLUCOSE:  Her glucose is now consistently below 100 at 94 compared to 91, previously 105 Fasting glucose has been as high as 113   She has been  on metformin ER 750 mg, taking 2 tablets in the evenings   A1c has been below the NORMAL range consistently   Lab Results  Component  Value Date   HGBA1C 5.0 12/14/2020   HGBA1C 5.1 04/09/2020   HGBA1C 5.3 10/18/2018   Lab Results  Component Value Date   LDLCALC 97 09/20/2020   CREATININE 0.75 12/14/2020    HYPOTHYROIDISM:  She has had a goiter since at least 1998. Baseline TSH was 5.4 when she was started on levothyroxine in 2000 and has required small doses of levothyroxine  She tends to have fatigue for multiple reasons and this is not any different lately  Has not required a change in her 75 g dose for the last few years TSH again normal   Lab Results  Component Value Date   TSH 1.69 12/14/2020   TSH 1.77 04/09/2020   TSH 3.12 10/05/2019   FREET4 1.25 12/14/2020   FREET4 1.04 04/09/2020   FREET4 1.07 01/31/2016     HYPERLIPIDEMIA: She has had diabetic dyslipidemia with HDL as low as 23 and high triglycerides. Previously on Crestor and fenofibrate combination  She was on Crestor and 2/19 was on 10 mg but subsequently she did not refill her prescription Crestor was not restarted since her LDL was normal  Tends to have high triglycerides and these are last back to normal with restarting fenofibrate 145 mg daily  LDL 97   Lab Results  Component Value Date   CHOL 169 09/20/2020   CHOL 145 04/09/2020   CHOL 154 10/05/2019   Lab  Results  Component Value Date   HDL 45.90 09/20/2020   HDL 28.40 (L) 04/09/2020   HDL 35.50 (L) 10/05/2019   Lab Results  Component Value Date   LDLCALC 97 09/20/2020   LDLCALC 84 10/05/2019   LDLCALC 98 06/10/2018   Lab Results  Component Value Date   TRIG 132.0 09/20/2020   TRIG 262.0 (H) 04/09/2020   TRIG 172.0 (H) 10/05/2019   Lab Results  Component Value Date   CHOLHDL 4 09/20/2020   CHOLHDL 5 04/09/2020   CHOLHDL 4 10/05/2019   Lab Results  Component Value Date   LDLDIRECT 73.0 04/09/2020   LDLDIRECT 113.0 02/15/2019   LDLDIRECT 122.0 10/18/2018      Allergies as of 12/20/2020   No Known Allergies     Medication List       Accurate as  of December 20, 2020  1:11 PM. If you have any questions, ask your nurse or doctor.        diclofenac 75 MG EC tablet Commonly known as: VOLTAREN Take 75 mg by mouth 2 (two) times daily.   fenofibrate 145 MG tablet Commonly known as: TRICOR TAKE 1 TABLET BY MOUTH EVERY DAY   hydroxychloroquine 200 MG tablet Commonly known as: PLAQUENIL TAKE 1 TABLET BY MOUTH TWICE A DAY WITH FOOD OR MILK   ibuprofen 800 MG tablet Commonly known as: ADVIL Take 1 tablet (800 mg total) by mouth every 8 (eight) hours as needed.   lisinopril-hydrochlorothiazide 10-12.5 MG tablet Commonly known as: ZESTORETIC TAKE 1 TABLET BY MOUTH DAILY.   metFORMIN 750 MG 24 hr tablet Commonly known as: GLUCOPHAGE-XR TAKE 2 TABLETS BY MOUTH EVERY DAY   Ozempic (0.25 or 0.5 MG/DOSE) 2 MG/1.5ML Sopn Generic drug: Semaglutide(0.25 or 0.5MG /DOS) Inject 0.5 mg into the skin once a week.   phentermine 15 MG capsule Take 1 capsule (15 mg total) by mouth every morning.   tamoxifen 20 MG tablet Commonly known as: NOLVADEX TAKE 1 TABLET BY MOUTH EVERY DAY   Unithroid 75 MCG tablet Generic drug: levothyroxine TAKE 1 TAB BY MOUTH ONCE DAILY. REPLACES NAME BRAND SYNTHROID       Allergies: No Known Allergies  Past Medical History:  Diagnosis Date  . Arthritis    RA  . Breast cancer ([place]) 01/2019   Right Breast Cancer  . Family history of breast cancer   . Hypertension   . Hypothyroidism   . Personal history of radiation therapy 2020   Right Breast Cancer    Past Surgical History:  Procedure Laterality Date  . BREAST LUMPECTOMY Right 04/20/2019  . BREAST LUMPECTOMY WITH RADIOACTIVE SEED LOCALIZATION Right 04/20/2019   Procedure: RIGHT BREAST LUMPECTOMY WITH RADIOACTIVE SEED LOCALIZATION;  Surgeon: Erroll Luna, MD;  Location: [facility];  Service: General;  Laterality: Right;  . CESAREAN SECTION     x2    Family History  Problem Relation Age of Onset  . Thyroid disease Mother   .  Diabetes Maternal Grandfather   . Hypertension Maternal Grandfather   . Diabetes Maternal Grandmother   . Breast cancer Paternal Grandmother     Social History:  reports that she has never smoked. She has never used smokeless tobacco. She reports previous alcohol use. She reports that she does not use drugs.  Review of Systems   She has been told to have rheumatoid arthritis, On Plaquenil   History of PCOS: Her menstrual cycles previously had been regular but not she has not had any since 07/2020 She  was told that this may be related to tamoxifen  Free testosterone is however higher than usual although total testosterone about the same  Lab Results  Component Value Date   TESTOFREE 4.4 (H) 12/14/2020   TESTOFREE 3.3 10/18/2018     Labs:  Lab on 12/14/2020  Component Date Value Ref Range Status  . Sodium 12/14/2020 140  135 - 145 mEq/L Final  . Potassium 12/14/2020 3.7  3.5 - 5.1 mEq/L Final  . Chloride 12/14/2020 103  96 - 112 mEq/L Final  . CO2 12/14/2020 25  19 - 32 mEq/L Final  . Glucose, Bld 12/14/2020 94  70 - 99 mg/dL Final  . BUN 12/14/2020 16  6 - 23 mg/dL Final  . Creatinine, Ser 12/14/2020 0.75  0.40 - 1.20 mg/dL Final  . GFR 12/14/2020 93.82  >60.00 mL/min Final   Calculated using the CKD-EPI Creatinine Equation (2021)  . Calcium 12/14/2020 9.7  8.4 - 10.5 mg/dL Final  . Hgb A1c MFr Bld 12/14/2020 5.0  4.6 - 6.5 % Final   Glycemic Control Guidelines for People with Diabetes:Non Diabetic:  <6%Goal of Therapy: <7%Additional Action Suggested:  >8%   . Free T4 12/14/2020 1.25  0.60 - 1.60 ng/dL Final   Comment: Specimens from patients who are undergoing biotin therapy and /or ingesting biotin supplements may contain high levels of biotin.  The higher biotin concentration in these specimens interferes with this Free T4 assay.  Specimens that contain high levels  of biotin may cause false high results for this Free T4 assay.  Please interpret results in light of the  total clinical presentation of the patient.    Marland Kitchen TSH 12/14/2020 1.69  0.35 - 4.50 uIU/mL Final  . Testosterone 12/14/2020 34  4 - 50 ng/dL Final  . Testosterone, Free 12/14/2020 4.4* 0.0 - 4.2 pg/mL Final  . Sex Hormone Binding 12/14/2020 55.0  24.6 - 122.0 nmol/L Final      EXAM:  BP 130/68   Pulse 98   Ht 5\' 3"  (1.6 m)   Wt 250 lb 9.6 oz (113.7 kg)   SpO2 94%   BMI 44.39 kg/m      Assessment/Plan:   OBESITY:  She has been able to start losing weight that adding phentermine However she is having symptoms of anxiety with this although tolerable She does try to improve her diet and has been able to follow this better with phentermine Exercise is somewhat limited by her rheumatoid arthritis  Since she will benefit from adding Topamax she can try alternating phentermine 15 mg 1 day with Topamax 25 the next day Not clear if she had tried Topamax previously by itself She will let us know if this is not working out  Impaired fasting glucose: Controlled with glucose below 100 again, also A1c normal  Hypothyroidism: Consistently well controlled with 75 mcg Continue same dose   LIPIDS: We will recheck on the next visit   HYPERTENSION: This is well-controlled on Zestoretic 10/12.5 as before   PCOS: Not clear if her amenorrhea is related to tamoxifen or whether it is related to higher free testosterone This is despite recent weight loss and continuing metformin 1500 mg She will benefit from going up to 2250 mg of metformin, she can take 1 pill in the morning and 2 tablets in the evening   We will recheck free testosterone level on the next visit also   There are no Patient Instructions on file for this visit.     Vicenta Aly  Houa Ackert 12/20/2020, 1:11 PM

## 2021-01-11 ENCOUNTER — Other Ambulatory Visit: Payer: Self-pay | Admitting: Obstetrics and Gynecology

## 2021-01-14 ENCOUNTER — Other Ambulatory Visit: Payer: Self-pay | Admitting: Obstetrics and Gynecology

## 2021-01-14 DIAGNOSIS — N920 Excessive and frequent menstruation with regular cycle: Secondary | ICD-10-CM

## 2021-01-16 ENCOUNTER — Other Ambulatory Visit: Payer: Self-pay | Admitting: Oncology

## 2021-01-16 DIAGNOSIS — Z9889 Other specified postprocedural states: Secondary | ICD-10-CM

## 2021-01-23 ENCOUNTER — Other Ambulatory Visit: Payer: Self-pay

## 2021-01-23 ENCOUNTER — Ambulatory Visit
Admission: RE | Admit: 2021-01-23 | Discharge: 2021-01-23 | Disposition: A | Discharge status: Home / Self Care | Payer: 59 | Source: Ambulatory Visit | Attending: Obstetrics and Gynecology | Admitting: Obstetrics and Gynecology

## 2021-01-23 DIAGNOSIS — N920 Excessive and frequent menstruation with regular cycle: Secondary | ICD-10-CM

## 2021-02-19 ENCOUNTER — Other Ambulatory Visit: Payer: Self-pay | Admitting: Endocrinology

## 2021-02-25 ENCOUNTER — Other Ambulatory Visit: Payer: Self-pay

## 2021-02-25 ENCOUNTER — Ambulatory Visit
Admission: RE | Admit: 2021-02-25 | Discharge: 2021-02-25 | Disposition: A | Discharge status: Home / Self Care | Payer: 59 | Source: Ambulatory Visit | Attending: Oncology | Admitting: Oncology

## 2021-02-25 DIAGNOSIS — Z9889 Other specified postprocedural states: Secondary | ICD-10-CM

## 2021-03-05 ENCOUNTER — Other Ambulatory Visit (HOSPITAL_COMMUNITY): Payer: Self-pay

## 2021-03-05 MED ORDER — DICLOFENAC SODIUM 75 MG PO TBEC
75.0000 mg | DELAYED_RELEASE_TABLET | Freq: Two times a day (BID) | ORAL | 2 refills | Status: DC
  Filled 2021-03-05: qty 60, 30d supply, fill #0
  Filled 2021-04-02: qty 60, 30d supply, fill #1
  Filled 2021-05-06: qty 60, 30d supply, fill #2

## 2021-03-06 ENCOUNTER — Other Ambulatory Visit (HOSPITAL_COMMUNITY): Payer: Self-pay

## 2021-03-06 MED ORDER — HYDROXYCHLOROQUINE SULFATE 200 MG PO TABS
200.0000 mg | ORAL_TABLET | Freq: Two times a day (BID) | ORAL | 0 refills | Status: DC
  Filled 2021-03-06: qty 60, 30d supply, fill #0

## 2021-03-06 MED FILL — Levothyroxine Sodium Tab 75 MCG: ORAL | 90 days supply | Qty: 90 | Fill #0 | Status: CN

## 2021-03-06 MED FILL — Tamoxifen Citrate Tab 20 MG (Base Equivalent): ORAL | 90 days supply | Qty: 90 | Fill #0 | Status: AC

## 2021-03-06 MED FILL — Fenofibrate Tab 145 MG: ORAL | 30 days supply | Qty: 30 | Fill #0 | Status: AC

## 2021-03-07 ENCOUNTER — Other Ambulatory Visit (HOSPITAL_COMMUNITY): Payer: Self-pay

## 2021-03-15 ENCOUNTER — Other Ambulatory Visit: Payer: 59

## 2021-03-19 ENCOUNTER — Other Ambulatory Visit: Payer: Self-pay

## 2021-03-21 ENCOUNTER — Ambulatory Visit: Payer: 59 | Admitting: Endocrinology

## 2021-04-02 ENCOUNTER — Other Ambulatory Visit (HOSPITAL_COMMUNITY): Payer: Self-pay

## 2021-04-02 ENCOUNTER — Other Ambulatory Visit: Payer: Self-pay | Admitting: Endocrinology

## 2021-04-03 ENCOUNTER — Other Ambulatory Visit (HOSPITAL_COMMUNITY): Payer: Self-pay

## 2021-04-03 MED ORDER — FENOFIBRATE 145 MG PO TABS
145.0000 mg | ORAL_TABLET | Freq: Every day | ORAL | 5 refills | Status: DC
  Filled 2021-04-03: qty 30, 30d supply, fill #0
  Filled 2021-05-06: qty 30, 30d supply, fill #1
  Filled 2021-06-03: qty 30, 30d supply, fill #2
  Filled 2021-07-03: qty 30, 30d supply, fill #3
  Filled 2021-07-30: qty 30, 30d supply, fill #4
  Filled 2021-09-02: qty 30, 30d supply, fill #5

## 2021-04-04 ENCOUNTER — Other Ambulatory Visit (HOSPITAL_COMMUNITY): Payer: Self-pay

## 2021-04-04 MED ORDER — HYDROXYCHLOROQUINE SULFATE 200 MG PO TABS
200.0000 mg | ORAL_TABLET | Freq: Two times a day (BID) | ORAL | 0 refills | Status: DC
  Filled 2021-04-04: qty 60, 30d supply, fill #0

## 2021-04-08 ENCOUNTER — Other Ambulatory Visit (INDEPENDENT_AMBULATORY_CARE_PROVIDER_SITE_OTHER): Payer: 59

## 2021-04-08 ENCOUNTER — Other Ambulatory Visit: Payer: Self-pay

## 2021-04-08 DIAGNOSIS — E782 Mixed hyperlipidemia: Secondary | ICD-10-CM | POA: Diagnosis not present

## 2021-04-08 DIAGNOSIS — E282 Polycystic ovarian syndrome: Secondary | ICD-10-CM | POA: Diagnosis not present

## 2021-04-08 DIAGNOSIS — R7301 Impaired fasting glucose: Secondary | ICD-10-CM | POA: Diagnosis not present

## 2021-04-09 LAB — LIPID PANEL
Cholesterol: 159 mg/dL (ref 0–200)
HDL: 43.6 mg/dL (ref >39.00)
LDL Cholesterol: 83 mg/dL (ref 0–99)
NonHDL: 115.78
Total CHOL/HDL Ratio: 4
Triglycerides: 162 mg/dL — ABNORMAL HIGH (ref 0.0–149.0)
VLDL: 32.4 mg/dL (ref 0.0–40.0)

## 2021-04-09 LAB — GLUCOSE, RANDOM: Glucose, Bld: 86 mg/dL (ref 70–99)

## 2021-04-10 ENCOUNTER — Ambulatory Visit: Payer: Self-pay | Admitting: Endocrinology

## 2021-04-10 LAB — TESTOSTERONE, FREE, TOTAL, SHBG
Sex Hormone Binding: 66.3 nmol/L (ref 24.6–122.0)
Testosterone, Free: 3.7 pg/mL (ref 0.0–4.2)
Testosterone: 40 ng/dL (ref 4–50)

## 2021-04-16 ENCOUNTER — Encounter: Payer: Self-pay | Admitting: Endocrinology

## 2021-04-16 ENCOUNTER — Other Ambulatory Visit: Payer: Self-pay

## 2021-04-16 ENCOUNTER — Ambulatory Visit: Payer: 59 | Admitting: Endocrinology

## 2021-04-16 DIAGNOSIS — R7301 Impaired fasting glucose: Secondary | ICD-10-CM

## 2021-04-16 DIAGNOSIS — I10 Essential (primary) hypertension: Secondary | ICD-10-CM | POA: Diagnosis not present

## 2021-04-16 DIAGNOSIS — E063 Autoimmune thyroiditis: Secondary | ICD-10-CM | POA: Diagnosis not present

## 2021-04-16 DIAGNOSIS — E282 Polycystic ovarian syndrome: Secondary | ICD-10-CM | POA: Diagnosis not present

## 2021-04-16 DIAGNOSIS — E782 Mixed hyperlipidemia: Secondary | ICD-10-CM | POA: Diagnosis not present

## 2021-04-16 NOTE — Patient Instructions (Addendum)
More exercise  Topamax daily  Check BP

## 2021-04-16 NOTE — Progress Notes (Signed)
Patient ID: Nicole Schwartz, female   DOB: [birth year], 50 y.o.   MRN: [id number]    Chief complaint: Followup of various problems   History of Present Illness:  OBESITY:    In 8/16 she was started on Belviq and with this she had previously lost over 20 pounds with baseline weight of 258 However her insurance does not cover this or any other weight loss medications including Saxenda Did not tolerate low-dose phentermine  Her weight was increased further in 01/2019 and she was recommended Contrave However she did not get the prescription for Contrave filled because of the cost Ozempic was denied by her insurance despite history of impaired fasting glucose  She is taking phentermine since 11/07/2020 Since she felt little jittery with this she is taking this every other day now Usually it is helping her cut back on her appetite and she is able to reduce portions  With adding 25 mg topiramate every other day she thinks her diet is better and she has less side effect  She tries to exercise with her bike or walking but is doing this less regularly now  She has maintained some weight loss and is down further 3 pounds since 1/22   Wt Readings from Last 3 Encounters:  04/16/21 247 lb 6.4 oz (112.2 kg)  12/20/20 250 lb 9.6 oz (113.7 kg)  09/25/20 259 lb 4.8 oz (117.6 kg)    HYPERTENSION:  She has had hypertension since at least 2004. Has been on ACE inhibitor since about 2005 Blood pressure is consistently controlled with lisinopril HCTZ  Periodically checking at home also, recent readings 118/77   BP Readings from Last 3 Encounters:  04/16/21 134/82  12/20/20 130/68  09/25/20 130/76      IMPAIRED FASTING GLUCOSE:  Her glucose is now consistently below 100 at 94 compared to 91, previously 105 Fasting glucose has been as high as 113   She has been  on metformin ER 750 mg, taking 2 tablets in the evenings   A1c has been below the NORMAL range  consistently   Lab Results  Component Value Date   HGBA1C 5.0 12/14/2020   HGBA1C 5.1 04/09/2020   HGBA1C 5.3 10/18/2018   Lab Results  Component Value Date   LDLCALC 83 04/08/2021   CREATININE 0.75 12/14/2020    HYPOTHYROIDISM:  She has had a goiter since at least 1998. Baseline TSH was 5.4 when she was started on levothyroxine in 2000 and has required small doses of levothyroxine  She tends to have fatigue for multiple reasons and this is not any different lately  Has not required a change in her 75 g dose for the last few years TSH consistently normal   Lab Results  Component Value Date   TSH 1.69 12/14/2020   TSH 1.77 04/09/2020   TSH 3.12 10/05/2019   FREET4 1.25 12/14/2020   FREET4 1.04 04/09/2020   FREET4 1.07 01/31/2016     HYPERLIPIDEMIA: She has had diabetic dyslipidemia with HDL as low as 23 and high triglycerides. Previously on Crestor and fenofibrate combination  She was on Crestor and 2/19 was on 10 mg but subsequently she did not refill her prescription Crestor was not restarted since her LDL was normal  Tends to have high triglycerides and last level is slightly higher but nonfasting Taking fenofibrate 145 mg daily  LDL below 100  Lab Results  Component Value Date   CHOL 159 04/08/2021  CHOL 169 09/20/2020   CHOL 145 04/09/2020   Lab Results  Component Value Date   HDL 43.60 04/08/2021   HDL 45.90 09/20/2020   HDL 28.40 (L) 04/09/2020   Lab Results  Component Value Date   LDLCALC 83 04/08/2021   LDLCALC 97 09/20/2020   LDLCALC 84 10/05/2019   Lab Results  Component Value Date   TRIG 162.0 (H) 04/08/2021   TRIG 132.0 09/20/2020   TRIG 262.0 (H) 04/09/2020   Lab Results  Component Value Date   CHOLHDL 4 04/08/2021   CHOLHDL 4 09/20/2020   CHOLHDL 5 04/09/2020   Lab Results  Component Value Date   LDLDIRECT 73.0 04/09/2020   LDLDIRECT 113.0 02/15/2019   LDLDIRECT 122.0 10/18/2018      Allergies as of 04/16/2021   No  Known Allergies     Medication List       Accurate as of Apr 16, 2021 11:59 PM. If you have any questions, ask your nurse or doctor.        diclofenac 75 MG EC tablet Commonly known as: VOLTAREN Take 75 mg by mouth 2 (two) times daily.   diclofenac 75 MG EC tablet Commonly known as: VOLTAREN Take 1 tablet (75 mg total) by mouth 2 (two) times daily.   fenofibrate 145 MG tablet Commonly known as: TRICOR Take 1 tablet (145 mg total) by mouth daily.   hydroxychloroquine 200 MG tablet Commonly known as: PLAQUENIL TAKE 1 TABLET BY MOUTH TWICE A DAY WITH FOOD OR MILK   hydroxychloroquine 200 MG tablet Commonly known as: PLAQUENIL Take 1 tablet (200 mg total) by mouth 2 (two) times daily with food or milk   ibuprofen 800 MG tablet Commonly known as: ADVIL Take 1 tablet (800 mg total) by mouth every 8 (eight) hours as needed.   lisinopril-hydrochlorothiazide 10-12.5 MG tablet Commonly known as: ZESTORETIC TAKE 1 TABLET BY MOUTH DAILY.   metFORMIN 750 MG 24 hr tablet Commonly known as: GLUCOPHAGE-XR TAKE 2 TABLETS BY MOUTH EVERY DAY   phentermine 15 MG capsule Take 1 capsule (15 mg total) by mouth every morning.   tamoxifen 20 MG tablet Commonly known as: NOLVADEX Take 1 tablet (20 mg total) by mouth daily.   topiramate 25 MG tablet Commonly known as: Topamax Take 1 tablet (25 mg total) by mouth daily.   Unithroid 75 MCG tablet Generic drug: levothyroxine TAKE 1 TAB BY MOUTH ONCE DAILY. REPLACES NAME BRAND SYNTHROID       Allergies: No Known Allergies  Past Medical History:  Diagnosis Date  . Arthritis    RA  . Breast cancer ([place]) 01/2019   Right Breast Cancer  . Family history of breast cancer   . Hypertension   . Hypothyroidism   . Personal history of radiation therapy 2020   Right Breast Cancer    Past Surgical History:  Procedure Laterality Date  . BREAST LUMPECTOMY Right 04/20/2019  . BREAST LUMPECTOMY WITH RADIOACTIVE SEED LOCALIZATION Right  04/20/2019   Procedure: RIGHT BREAST LUMPECTOMY WITH RADIOACTIVE SEED LOCALIZATION;  Surgeon: Erroll Luna, MD;  Location: [facility];  Service: General;  Laterality: Right;  . CESAREAN SECTION     x2    Family History  Problem Relation Age of Onset  . Thyroid disease Mother   . Diabetes Maternal Grandfather   . Hypertension Maternal Grandfather   . Diabetes Maternal Grandmother   . Breast cancer Paternal Grandmother     Social History:  reports that she has never smoked.  She has never used smokeless tobacco. She reports previous alcohol use. She reports that she does not use drugs.  Review of Systems   She has been told to have rheumatoid arthritis, On Plaquenil   History of PCOS: Her menstrual cycles previously had been regular but not she has not had any since 07/2020 She was told that this may be related to tamoxifen  Free testosterone which was previously higher in 1/22 expected normal with weight loss  Lab Results  Component Value Date   TESTOFREE 3.7 04/08/2021   TESTOFREE 4.4 (H) 12/14/2020   TESTOFREE 3.3 10/18/2018     Labs:  No visits with results within 1 Week(s) from this visit.  Latest known visit with results is:  Lab on 04/08/2021  Component Date Value Ref Range Status  . Glucose, Bld 04/08/2021 86  70 - 99 mg/dL Final  . Testosterone 04/08/2021 40  4 - 50 ng/dL Final  . Testosterone, Free 04/08/2021 3.7  0.0 - 4.2 pg/mL Final  . Sex Hormone Binding 04/08/2021 66.3  24.6 - 122.0 nmol/L Final  . Cholesterol 04/08/2021 159  0 - 200 mg/dL Final   ATP III Classification       Desirable:  < 200 mg/dL               Borderline High:  200 - 239 mg/dL          High:  > = 240 mg/dL  . Triglycerides 04/08/2021 162.0* 0.0 - 149.0 mg/dL Final   Normal:  <150 mg/dLBorderline High:  150 - 199 mg/dL  . HDL 04/08/2021 43.60  >39.00 mg/dL Final  . VLDL 04/08/2021 32.4  0.0 - 40.0 mg/dL Final  . LDL Cholesterol 04/08/2021 83  0 - 99 mg/dL Final  . Total  CHOL/HDL Ratio 04/08/2021 4   Final                  Men          Women1/2 Average Risk     3.4          3.3Average Risk          5.0          4.42X Average Risk          9.6          7.13X Average Risk          15.0          11.0                      . NonHDL 04/08/2021 115.78   Final   NOTE:  Non-HDL goal should be 30 mg/dL higher than patient's LDL goal (i.e. LDL goal of < 70 mg/dL, would have non-HDL goal of < 100 mg/dL)      EXAM:  BP 134/82   Pulse 82   Ht 5\' 3"  (1.6 m)   Wt 247 lb 6.4 oz (112.2 kg)   SpO2 95%   BMI 43.82 kg/m      Assessment/Plan:   OBESITY with BMI over 40:  She has been able to start losing weight with adding phentermine low doses and now Topamax also She is tolerating this fairly well and has no side effects She thinks she is mostly watching her diet and usually is doing better with portion control and healthy meals Exercise is somewhat limited by her rheumatoid arthritis and recently not doing as much  She will take Topamax daily  now while taking phentermine every other day   Hypothyroidism: To have labs checked on the next visit   LIPIDS: Well controlled although triglycerides slightly higher from being nonfasting   HYPERTENSION: This is usually well-controlled on Zestoretic 10/12.5 but blood pressure is higher in the office today She will check more regularly at home and let us know if it is consistently high   PCOS: Free testosterone better with increasing metformin and some weight loss    Patient Instructions  More exercise  Topamax daily  Check BP      Elayne Snare 04/17/2021, 10:41 AM

## 2021-04-17 ENCOUNTER — Encounter: Payer: Self-pay | Admitting: Endocrinology

## 2021-04-18 DIAGNOSIS — D0511 Intraductal carcinoma in situ of right breast: Secondary | ICD-10-CM | POA: Diagnosis not present

## 2021-04-18 DIAGNOSIS — M2550 Pain in unspecified joint: Secondary | ICD-10-CM | POA: Diagnosis not present

## 2021-04-18 DIAGNOSIS — M0589 Other rheumatoid arthritis with rheumatoid factor of multiple sites: Secondary | ICD-10-CM | POA: Diagnosis not present

## 2021-04-18 DIAGNOSIS — Z6841 Body Mass Index (BMI) 40.0 and over, adult: Secondary | ICD-10-CM | POA: Diagnosis not present

## 2021-04-18 DIAGNOSIS — Z79899 Other long term (current) drug therapy: Secondary | ICD-10-CM | POA: Diagnosis not present

## 2021-04-29 ENCOUNTER — Other Ambulatory Visit: Payer: Self-pay | Admitting: Endocrinology

## 2021-05-06 ENCOUNTER — Other Ambulatory Visit (HOSPITAL_COMMUNITY): Payer: Self-pay

## 2021-05-07 ENCOUNTER — Other Ambulatory Visit (HOSPITAL_COMMUNITY): Payer: Self-pay

## 2021-05-07 MED ORDER — HYDROXYCHLOROQUINE SULFATE 200 MG PO TABS
200.0000 mg | ORAL_TABLET | Freq: Two times a day (BID) | ORAL | 3 refills | Status: DC
  Filled 2021-05-07: qty 60, 30d supply, fill #0
  Filled 2021-06-03: qty 60, 30d supply, fill #1
  Filled 2021-07-03: qty 60, 30d supply, fill #2
  Filled 2021-07-30: qty 60, 30d supply, fill #3

## 2021-05-15 ENCOUNTER — Other Ambulatory Visit: Payer: Self-pay | Admitting: Endocrinology

## 2021-05-16 ENCOUNTER — Other Ambulatory Visit (HOSPITAL_COMMUNITY): Payer: Self-pay

## 2021-05-16 DIAGNOSIS — E063 Autoimmune thyroiditis: Secondary | ICD-10-CM

## 2021-05-16 MED FILL — Levothyroxine Sodium Tab 75 MCG: ORAL | 90 days supply | Qty: 90 | Fill #0 | Status: CN

## 2021-05-17 ENCOUNTER — Other Ambulatory Visit (HOSPITAL_COMMUNITY): Payer: Self-pay

## 2021-05-17 MED ORDER — LISINOPRIL-HYDROCHLOROTHIAZIDE 10-12.5 MG PO TABS
1.0000 | ORAL_TABLET | Freq: Every day | ORAL | 1 refills | Status: DC
  Filled 2021-05-17: qty 90, 90d supply, fill #0
  Filled 2021-08-14: qty 90, 90d supply, fill #1

## 2021-05-17 MED ORDER — METFORMIN HCL ER 750 MG PO TB24
1500.0000 mg | ORAL_TABLET | Freq: Every day | ORAL | 1 refills | Status: DC
  Filled 2021-05-17: qty 180, 90d supply, fill #0
  Filled 2021-08-14: qty 180, 90d supply, fill #1

## 2021-05-17 MED ORDER — LEVOTHYROXINE SODIUM 75 MCG PO TABS
75.0000 ug | ORAL_TABLET | Freq: Every day | ORAL | 1 refills | Status: DC
Start: 2021-05-17 — End: 2021-12-02
  Filled 2021-05-17 – 2021-05-30 (×3): qty 90, 90d supply, fill #0
  Filled 2021-09-10: qty 90, 90d supply, fill #1

## 2021-05-20 ENCOUNTER — Other Ambulatory Visit (HOSPITAL_COMMUNITY): Payer: Self-pay

## 2021-05-24 DIAGNOSIS — Z01419 Encounter for gynecological examination (general) (routine) without abnormal findings: Secondary | ICD-10-CM | POA: Diagnosis not present

## 2021-05-28 ENCOUNTER — Other Ambulatory Visit: Payer: Self-pay | Admitting: Endocrinology

## 2021-05-28 ENCOUNTER — Other Ambulatory Visit (HOSPITAL_COMMUNITY): Payer: Self-pay

## 2021-05-28 DIAGNOSIS — E063 Autoimmune thyroiditis: Secondary | ICD-10-CM

## 2021-05-29 ENCOUNTER — Other Ambulatory Visit (HOSPITAL_COMMUNITY): Payer: Self-pay

## 2021-05-30 ENCOUNTER — Other Ambulatory Visit (HOSPITAL_COMMUNITY): Payer: Self-pay

## 2021-06-03 ENCOUNTER — Other Ambulatory Visit (HOSPITAL_COMMUNITY): Payer: Self-pay

## 2021-06-03 ENCOUNTER — Other Ambulatory Visit: Payer: Self-pay | Admitting: Hematology and Oncology

## 2021-06-04 ENCOUNTER — Other Ambulatory Visit (HOSPITAL_COMMUNITY): Payer: Self-pay

## 2021-06-04 MED ORDER — TAMOXIFEN CITRATE 20 MG PO TABS
20.0000 mg | ORAL_TABLET | Freq: Every day | ORAL | 0 refills | Status: DC
  Filled 2021-06-04: qty 30, 30d supply, fill #0

## 2021-06-04 MED ORDER — DICLOFENAC SODIUM 75 MG PO TBEC
75.0000 mg | DELAYED_RELEASE_TABLET | Freq: Two times a day (BID) | ORAL | 2 refills | Status: DC
  Filled 2021-06-04: qty 60, 30d supply, fill #0
  Filled 2021-07-03: qty 60, 30d supply, fill #1
  Filled 2021-07-30: qty 60, 30d supply, fill #2

## 2021-07-03 ENCOUNTER — Other Ambulatory Visit (HOSPITAL_COMMUNITY): Payer: Self-pay

## 2021-07-03 ENCOUNTER — Other Ambulatory Visit: Payer: Self-pay | Admitting: Hematology and Oncology

## 2021-07-04 ENCOUNTER — Other Ambulatory Visit: Payer: Self-pay

## 2021-07-04 ENCOUNTER — Other Ambulatory Visit (HOSPITAL_COMMUNITY): Payer: Self-pay

## 2021-07-04 MED ORDER — TAMOXIFEN CITRATE 20 MG PO TABS
20.0000 mg | ORAL_TABLET | Freq: Every day | ORAL | 0 refills | Status: DC
  Filled 2021-07-04: qty 30, 30d supply, fill #0

## 2021-07-05 ENCOUNTER — Telehealth: Payer: Self-pay | Admitting: Hematology and Oncology

## 2021-07-05 NOTE — Telephone Encounter (Signed)
Scheduled appt per 8/4 sch msg. Called pt, no answer. Left msg with appt date and time.  

## 2021-07-21 NOTE — Progress Notes (Signed)
 Patient Care Team: Redmon, Noelle, PA as PCP - General (Nurse Practitioner) Moody, John, MD as Consulting Physician (Radiation Oncology) Cornett, Thomas, MD as Consulting Physician (General Surgery) Gudena, Vinay, MD as Consulting Physician (Hematology and Oncology)  DIAGNOSIS:    ICD-10-CM   1. Malignant neoplasm of upper-outer quadrant of right breast in female, estrogen receptor positive (HCC)  C50.411    Z17.0       SUMMARY OF ONCOLOGIC HISTORY: Oncology History  Malignant neoplasm of upper-outer quadrant of right breast in female, estrogen receptor positive (HCC)  03/01/2019 Initial Diagnosis   Diagnostic mammogram detected 2.5cm calcifications in right breast. 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    03/09/2019 Cancer Staging   Staging form: Breast, AJCC 8th Edition - Clinical stage from 03/09/2019: Stage 0 (cTis (DCIS), cN0, cM0, ER+, PR+) - Signed by Causey, Lindsey Cornetto, NP on 03/09/2019   04/06/2019 Genetic Testing   Negative genetic testing on the common hereditary cancer panel.  The Common Hereditary Gene Panel offered by [facility] includes sequencing and/or deletion duplication testing of the following 48 genes: APC, ATM, AXIN2, BARD1, BMPR1A, BRCA1, BRCA2, BRIP1, CDH1, CDK4, CDKN2A (p14ARF), CDKN2A (p16INK4a), CHEK2, CTNNA1, DICER1, EPCAM (Deletion/duplication testing only), GREM1 (promoter region deletion/duplication testing only), KIT, MEN1, MLH1, MSH2, MSH3, MSH6, MUTYH, NBN, NF1, NHTL1, PALB2, PDGFRA, PMS2, POLD1, POLE, PTEN, RAD50, RAD51C, RAD51D, RNF43, SDHB, SDHC, SDHD, SMAD4, SMARCA4. STK11, TP53, TSC1, TSC2, and VHL.  The following genes were evaluated for sequence changes only: SDHA and HOXB13 c.251G>A variant only. The report date is Apr 06, 2019.   04/20/2019 Surgery   Right lumpectomy (Cornett): DCIS, 1.5cm, intermediate grade, ER+ 95%, PR+ 95%, clear margins.    04/20/2019 Cancer Staging   Staging form: Breast, AJCC 8th Edition - Pathologic  stage from 04/20/2019: Stage 0 (pTis (DCIS), pN0, cM0, ER+, PR+) - Signed by Causey, Lindsey Cornetto, NP on 10/23/2019   05/31/2019 -  Radiation Therapy   Adjuvant XRT   08/2019 -  Anti-estrogen oral therapy   Tamoxifen daily     CHIEF COMPLIANT:  Follow-up on tamoxifen therapy  INTERVAL HISTORY: Nicole Schwartz is a 50 y.o. with above-mentioned history of right breast DCIS who underwent a lumpectomy and radiation therapy, currently on antiestrogen therapy with tamoxifen. She presents to the clinic today for follow-up.  She is tolerating tamoxifen extremely well without any problems or concerns.  Denies any hot flashes or arthralgias or myalgias.  ALLERGIES:  has No Known Allergies.  MEDICATIONS:  Current Outpatient Medications  Medication Sig Dispense Refill   diclofenac (VOLTAREN) 75 MG EC tablet Take 75 mg by mouth 2 (two) times daily.  2   diclofenac (VOLTAREN) 75 MG EC tablet Take 1 tablet (75 mg total) by mouth 2 (two) times daily. 60 tablet 2   fenofibrate (TRICOR) 145 MG tablet Take 1 tablet (145 mg total) by mouth daily. 30 tablet 5   hydroxychloroquine (PLAQUENIL) 200 MG tablet TAKE 1 TABLET BY MOUTH TWICE A DAY WITH FOOD OR MILK  2   hydroxychloroquine (PLAQUENIL) 200 MG tablet TAKE 1 TABLET BY MOUTH TWICE A DAY WITH FOOD OR MILK 60 tablet 3   ibuprofen (ADVIL) 800 MG tablet Take 1 tablet (800 mg total) by mouth every 8 (eight) hours as needed. (Patient not taking: Reported on 12/20/2020) 30 tablet 0   levothyroxine (SYNTHROID) 75 MCG tablet Take 1 tablet (75 mcg total) by mouth daily before breakfast. 90 tablet 1   lisinopril-hydrochlorothiazide (ZESTORETIC) 10-12.5 MG tablet Take   1 tablet by mouth daily. 90 tablet 1   metFORMIN (GLUCOPHAGE-XR) 750 MG 24 hr tablet Take 2 tablets (1,500 mg total) by mouth daily. 180 tablet 1   phentermine 15 MG capsule Take 1 capsule (15 mg total) by mouth every morning. 30 capsule 2   tamoxifen (NOLVADEX) 20 MG tablet Take 1 tablet (20 mg  total) by mouth daily. 30 tablet 0   topiramate (TOPAMAX) 25 MG tablet Take 1 tablet (25 mg total) by mouth daily. 30 tablet 2   No current facility-administered medications for this visit.    PHYSICAL EXAMINATION: ECOG PERFORMANCE STATUS: 1 - Symptomatic but completely ambulatory  There were no vitals filed for this visit. There were no vitals filed for this visit.    LABORATORY DATA:  I have reviewed the data as listed CMP Latest Ref Rng & Units 04/08/2021 12/14/2020 09/20/2020  Glucose 70 - 99 mg/dL 86 94 91  BUN 6 - 23 mg/dL - 16 17  Creatinine 0.40 - 1.20 mg/dL - 0.75 0.79  Sodium 135 - 145 mEq/L - 140 138  Potassium 3.5 - 5.1 mEq/L - 3.7 4.2  Chloride 96 - 112 mEq/L - 103 103  CO2 19 - 32 mEq/L - 25 29  Calcium 8.4 - 10.5 mg/dL - 9.7 9.5  Total Protein 6.0 - 8.3 g/dL - - 6.4  Total Bilirubin 0.2 - 1.2 mg/dL - - 0.4  Alkaline Phos 39 - 117 U/L - - 24(L)  AST 0 - 37 U/L - - 17  ALT 0 - 35 U/L - - 19    Lab Results  Component Value Date   WBC 5.4 04/15/2019   HGB 14.3 04/15/2019   HCT 41.6 04/15/2019   MCV 86.1 04/15/2019   PLT 281 04/15/2019    ASSESSMENT & PLAN:  Malignant neoplasm of upper-outer quadrant of right breast in female, estrogen receptor positive (HCC) Antiestrogen treatment plan3/31/2020:Diagnostic mammogram detected 2.5cm calcifications in right breast. 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    Treatment plan: 1. Adjuvant radiation therapy started 05/31/2019 2. Followed by antiestrogen therapy with tamoxifen 5 years, started 08/02/2019   Tamoxifen toxicities: Denies any adverse effects to tamoxifen.  Breast cancer surveillance: 1.  Breast exam 07/22/2021: Benign 2. mammogram 02/25/2021: Benign breast density category B  Return to clinic in 1 year for follow-up    No orders of the defined types were placed in this  encounter.  The patient has a good understanding of the overall plan. she agrees with it. she will call with any problems that may develop before the next visit here.  Total time spent: 20 mins including face to face time and time spent for planning, charting and coordination of care  Rulon Eisenmenger, MD, MPH 07/22/2021  I, Thana Ates, am acting as scribe for Dr. Nicholas Lose.  I have reviewed the above documentation for accuracy and completeness, and I agree with the above.

## 2021-07-22 ENCOUNTER — Other Ambulatory Visit (HOSPITAL_COMMUNITY): Payer: Self-pay

## 2021-07-22 ENCOUNTER — Inpatient Hospital Stay: Payer: 59 | Attending: Hematology and Oncology | Admitting: Hematology and Oncology

## 2021-07-22 ENCOUNTER — Other Ambulatory Visit: Payer: Self-pay

## 2021-07-22 DIAGNOSIS — D0511 Intraductal carcinoma in situ of right breast: Secondary | ICD-10-CM | POA: Diagnosis not present

## 2021-07-22 DIAGNOSIS — Z170 Estrogen receptor positive status [ER+]: Secondary | ICD-10-CM | POA: Diagnosis not present

## 2021-07-22 DIAGNOSIS — C50411 Malignant neoplasm of upper-outer quadrant of right female breast: Secondary | ICD-10-CM

## 2021-07-22 DIAGNOSIS — Z79810 Long term (current) use of selective estrogen receptor modulators (SERMs): Secondary | ICD-10-CM | POA: Insufficient documentation

## 2021-07-22 DIAGNOSIS — Z923 Personal history of irradiation: Secondary | ICD-10-CM | POA: Insufficient documentation

## 2021-07-22 MED ORDER — TAMOXIFEN CITRATE 20 MG PO TABS
20.0000 mg | ORAL_TABLET | Freq: Every day | ORAL | 3 refills | Status: DC
  Filled 2021-07-22 – 2021-07-30 (×2): qty 90, 90d supply, fill #0
  Filled 2021-12-02: qty 90, 90d supply, fill #1
  Filled 2022-02-28: qty 90, 90d supply, fill #2
  Filled 2022-05-26: qty 90, 90d supply, fill #3

## 2021-07-22 NOTE — Assessment & Plan Note (Addendum)
Antiestrogen treatment plan3/31/2020:Diagnostic mammogram detected 2.5cm calcifications in right breast. 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  Treatment plan: 1. Adjuvant radiation therapy started 05/31/2019 2. Followed by antiestrogen therapy with tamoxifen 5 years, will start 08/02/2019  Tamoxifen toxicities:  Breast cancer surveillance: 1.  Breast exam 07/22/2021: Benign 2. mammogram 02/25/2021: Benign breast density category B  Return to clinic in 1 year for follow-up

## 2021-07-31 ENCOUNTER — Other Ambulatory Visit (HOSPITAL_COMMUNITY): Payer: Self-pay

## 2021-08-14 ENCOUNTER — Other Ambulatory Visit (HOSPITAL_COMMUNITY): Payer: Self-pay

## 2021-09-02 ENCOUNTER — Other Ambulatory Visit (HOSPITAL_COMMUNITY): Payer: Self-pay

## 2021-09-03 ENCOUNTER — Other Ambulatory Visit (HOSPITAL_COMMUNITY): Payer: Self-pay

## 2021-09-03 MED ORDER — DICLOFENAC SODIUM 75 MG PO TBEC
75.0000 mg | DELAYED_RELEASE_TABLET | Freq: Two times a day (BID) | ORAL | 2 refills | Status: DC
  Filled 2021-09-03: qty 60, 30d supply, fill #0
  Filled 2021-09-26: qty 60, 30d supply, fill #1
  Filled 2021-10-31: qty 60, 30d supply, fill #2

## 2021-09-03 MED ORDER — HYDROXYCHLOROQUINE SULFATE 200 MG PO TABS
200.0000 mg | ORAL_TABLET | Freq: Two times a day (BID) | ORAL | 3 refills | Status: DC
  Filled 2021-09-03: qty 60, 30d supply, fill #0
  Filled 2021-09-26 – 2021-09-30 (×2): qty 60, 30d supply, fill #1
  Filled 2021-10-31: qty 60, 30d supply, fill #2
  Filled 2021-12-02: qty 60, 30d supply, fill #3

## 2021-09-10 ENCOUNTER — Other Ambulatory Visit (HOSPITAL_COMMUNITY): Payer: Self-pay

## 2021-09-26 ENCOUNTER — Other Ambulatory Visit: Payer: Self-pay | Admitting: Endocrinology

## 2021-09-27 ENCOUNTER — Other Ambulatory Visit (HOSPITAL_COMMUNITY): Payer: Self-pay

## 2021-09-27 MED ORDER — FENOFIBRATE 145 MG PO TABS
145.0000 mg | ORAL_TABLET | Freq: Every day | ORAL | 5 refills | Status: DC
  Filled 2021-09-27: qty 30, 30d supply, fill #0
  Filled 2021-10-31: qty 30, 30d supply, fill #1
  Filled 2021-12-02: qty 30, 30d supply, fill #2
  Filled 2022-01-01: qty 30, 30d supply, fill #3
  Filled 2022-01-29: qty 30, 30d supply, fill #4
  Filled 2022-02-26: qty 30, 30d supply, fill #5

## 2021-09-30 ENCOUNTER — Other Ambulatory Visit (HOSPITAL_COMMUNITY): Payer: Self-pay

## 2021-10-14 ENCOUNTER — Other Ambulatory Visit (INDEPENDENT_AMBULATORY_CARE_PROVIDER_SITE_OTHER): Payer: 59

## 2021-10-14 ENCOUNTER — Other Ambulatory Visit: Payer: 59

## 2021-10-14 DIAGNOSIS — R7301 Impaired fasting glucose: Secondary | ICD-10-CM | POA: Diagnosis not present

## 2021-10-14 DIAGNOSIS — E063 Autoimmune thyroiditis: Secondary | ICD-10-CM

## 2021-10-14 DIAGNOSIS — E782 Mixed hyperlipidemia: Secondary | ICD-10-CM

## 2021-10-14 LAB — LIPID PANEL
Cholesterol: 146 mg/dL (ref 0–200)
HDL: 42.6 mg/dL (ref >39.00)
LDL Cholesterol: 76 mg/dL (ref 0–99)
NonHDL: 103.12
Total CHOL/HDL Ratio: 3
Triglycerides: 135 mg/dL (ref 0.0–149.0)
VLDL: 27 mg/dL (ref 0.0–40.0)

## 2021-10-14 LAB — HEMOGLOBIN A1C: Hgb A1c MFr Bld: 5.4 % (ref 4.6–6.5)

## 2021-10-14 LAB — COMPREHENSIVE METABOLIC PANEL
ALT: 13 U/L (ref 0–35)
AST: 15 U/L (ref 0–37)
Albumin: 4.1 g/dL (ref 3.5–5.2)
Alkaline Phosphatase: 27 U/L — ABNORMAL LOW (ref 39–117)
BUN: 18 mg/dL (ref 6–23)
CO2: 27 mEq/L (ref 19–32)
Calcium: 9.3 mg/dL (ref 8.4–10.5)
Chloride: 102 mEq/L (ref 96–112)
Creatinine, Ser: 0.75 mg/dL (ref 0.40–1.20)
GFR: 93.27 mL/min (ref >60.00)
Glucose, Bld: 84 mg/dL (ref 70–99)
Potassium: 3.5 mEq/L (ref 3.5–5.1)
Sodium: 137 mEq/L (ref 135–145)
Total Bilirubin: 0.3 mg/dL (ref 0.2–1.2)
Total Protein: 6.7 g/dL (ref 6.0–8.3)

## 2021-10-14 LAB — T4, FREE: Free T4: 1.19 ng/dL (ref 0.60–1.60)

## 2021-10-14 LAB — TSH: TSH: 1.93 u[IU]/mL (ref 0.35–5.50)

## 2021-10-17 ENCOUNTER — Ambulatory Visit: Payer: 59 | Admitting: Endocrinology

## 2021-10-17 ENCOUNTER — Other Ambulatory Visit (HOSPITAL_COMMUNITY): Payer: Self-pay

## 2021-10-17 ENCOUNTER — Encounter: Payer: Self-pay | Admitting: Endocrinology

## 2021-10-17 VITALS — BP 122/76 | HR 76 | Ht 62.0 in | Wt 236.8 lb

## 2021-10-17 DIAGNOSIS — R7301 Impaired fasting glucose: Secondary | ICD-10-CM

## 2021-10-17 DIAGNOSIS — M2550 Pain in unspecified joint: Secondary | ICD-10-CM | POA: Diagnosis not present

## 2021-10-17 DIAGNOSIS — Z79899 Other long term (current) drug therapy: Secondary | ICD-10-CM | POA: Diagnosis not present

## 2021-10-17 DIAGNOSIS — I10 Essential (primary) hypertension: Secondary | ICD-10-CM | POA: Diagnosis not present

## 2021-10-17 DIAGNOSIS — E063 Autoimmune thyroiditis: Secondary | ICD-10-CM

## 2021-10-17 DIAGNOSIS — M0589 Other rheumatoid arthritis with rheumatoid factor of multiple sites: Secondary | ICD-10-CM | POA: Diagnosis not present

## 2021-10-17 DIAGNOSIS — E282 Polycystic ovarian syndrome: Secondary | ICD-10-CM | POA: Diagnosis not present

## 2021-10-17 DIAGNOSIS — E782 Mixed hyperlipidemia: Secondary | ICD-10-CM

## 2021-10-17 DIAGNOSIS — D0511 Intraductal carcinoma in situ of right breast: Secondary | ICD-10-CM | POA: Diagnosis not present

## 2021-10-17 DIAGNOSIS — Z6841 Body Mass Index (BMI) 40.0 and over, adult: Secondary | ICD-10-CM | POA: Diagnosis not present

## 2021-10-17 MED ORDER — PHENTERMINE HCL 15 MG PO CAPS
15.0000 mg | ORAL_CAPSULE | ORAL | 3 refills | Status: DC
Start: 2021-10-17 — End: 2022-07-22
  Filled 2021-10-17: qty 24, 24d supply, fill #0
  Filled 2021-10-17: qty 6, 6d supply, fill #0

## 2021-10-17 MED ORDER — TOPIRAMATE 25 MG PO TABS
25.0000 mg | ORAL_TABLET | Freq: Two times a day (BID) | ORAL | 5 refills | Status: DC
  Filled 2021-10-17: qty 90, 45d supply, fill #0

## 2021-10-17 NOTE — Progress Notes (Signed)
Patient ID: Nicole Schwartz, female   DOB: Jul 25, 1971, 50 y.o.   MRN: [id number]    Chief complaint: Followup of various problems   History of Present Illness:  OBESITY:    In 8/16 she was started on Belviq and with this she had previously lost over 20 pounds with baseline weight of 258 pounds However this was stopped because of lack of insurance coverage  Ozempic was denied by her insurance despite history of impaired fasting glucose  She is taking phentermine since 11/07/2020 Since she felt jittery with this she is taking this every other day   With adding 25 mg topiramate every day she has been able to have better control on her appetite and she is able to reduce portions No side effects  She tries to exercise with walking during lunchtime, previously was also using her exercise bike  She has lost about 7 pounds   Wt Readings from Last 3 Encounters:  10/17/21 236 lb 12.8 oz (107.4 kg)  07/22/21 243 lb 14.4 oz (110.6 kg)  04/16/21 247 lb 6.4 oz (112.2 kg)    HYPERTENSION:  She has had hypertension since at least 2004. Has been on ACE inhibitor since about 2005 Blood pressure is consistently controlled with lisinopril HCTZ  She has been checking at home also, recent readings 494 systolic and usually under 80 diastolic   BP Readings from Last 3 Encounters:  10/17/21 122/76  07/22/21 134/68  04/16/21 134/82      IMPAIRED FASTING GLUCOSE:  Her fasting glucose is now consistently below 100 Fasting glucose has been as high as 113   She has been  on metformin ER 750 mg, taking 2 tablets in the evenings   A1c has been below the NORMAL range consistently   Lab Results  Component Value Date   HGBA1C 5.4 10/14/2021   HGBA1C 5.0 12/14/2020   HGBA1C 5.1 04/09/2020   Lab Results  Component Value Date   LDLCALC 76 10/14/2021   CREATININE 0.75 10/14/2021    HYPOTHYROIDISM:  She has had a goiter since at least 1998. Baseline TSH was 5.4 when she  was started on levothyroxine in 2000 and has required small doses of levothyroxine No unusual fatigue lately  Has not required a change in her 75 g dose for the last few years TSH consistently normal   Lab Results  Component Value Date   TSH 1.93 10/14/2021   TSH 1.69 12/14/2020   TSH 1.77 04/09/2020   FREET4 1.19 10/14/2021   FREET4 1.25 12/14/2020   FREET4 1.04 04/09/2020     HYPERLIPIDEMIA: She has had diabetic dyslipidemia with HDL as low as 23 and high triglycerides. Previously on Crestor and fenofibrate combination  She was on Crestor and 2/19 was on 10 mg but subsequently LDL has been below 100 without statin  Tends to have high triglycerides and last level was slightly higher but nonfasting, now triglycerides are back to normal Taking fenofibrate 145 mg daily  LDL below 100  Lab Results  Component Value Date   CHOL 146 10/14/2021   CHOL 159 04/08/2021   CHOL 169 09/20/2020   Lab Results  Component Value Date   HDL 42.60 10/14/2021   HDL 43.60 04/08/2021   HDL 45.90 09/20/2020   Lab Results  Component Value Date   LDLCALC 76 10/14/2021   LDLCALC 83 04/08/2021   LDLCALC 97 09/20/2020   Lab Results  Component Value Date  TRIG 135.0 10/14/2021   TRIG 162.0 (H) 04/08/2021   TRIG 132.0 09/20/2020   Lab Results  Component Value Date   CHOLHDL 3 10/14/2021   CHOLHDL 4 04/08/2021   CHOLHDL 4 09/20/2020   Lab Results  Component Value Date   LDLDIRECT 73.0 04/09/2020   LDLDIRECT 113.0 02/15/2019   LDLDIRECT 122.0 10/18/2018      Allergies as of 10/17/2021   No Known Allergies      Medication List        Accurate as of October 17, 2021  4:28 PM. If you have any questions, ask your nurse or doctor.          diclofenac 75 MG EC tablet Commonly known as: VOLTAREN Take 75 mg by mouth 2 (two) times daily.   diclofenac 75 MG EC tablet Commonly known as: VOLTAREN Take 1 tablet (75 mg total) by mouth 2 (two) times daily.   fenofibrate  145 MG tablet Commonly known as: TRICOR Take 1 tablet (145 mg total) by mouth daily.   hydroxychloroquine 200 MG tablet Commonly known as: PLAQUENIL TAKE 1 TABLET BY MOUTH TWICE A DAY WITH FOOD OR MILK   hydroxychloroquine 200 MG tablet Commonly known as: PLAQUENIL Take 1 tablet (200 mg total) by mouth 2 (two) times daily with food or milk   lisinopril-hydrochlorothiazide 10-12.5 MG tablet Commonly known as: ZESTORETIC Take 1 tablet by mouth daily.   metFORMIN 750 MG 24 hr tablet Commonly known as: GLUCOPHAGE-XR Take 2 tablets (1,500 mg total) by mouth daily.   Synthroid 75 MCG tablet Generic drug: levothyroxine Take 1 tablet (75 mcg total) by mouth daily before breakfast.   tamoxifen 20 MG tablet Commonly known as: NOLVADEX Take 1 tablet (20 mg total) by mouth daily.        Allergies: No Known Allergies  Past Medical History:  Diagnosis Date   Arthritis    RA   Breast cancer ([place]) 01/2019   Right Breast Cancer   Family history of breast cancer    Hypertension    Hypothyroidism    Personal history of radiation therapy 2020   Right Breast Cancer    Past Surgical History:  Procedure Laterality Date   BREAST LUMPECTOMY Right 04/20/2019   BREAST LUMPECTOMY WITH RADIOACTIVE SEED LOCALIZATION Right 04/20/2019   Procedure: RIGHT BREAST LUMPECTOMY WITH RADIOACTIVE SEED LOCALIZATION;  Surgeon: Erroll Luna, MD;  Location: [facility];  Service: General;  Laterality: Right;   CESAREAN SECTION     x2    Family History  Problem Relation Age of Onset   Thyroid disease Mother    Diabetes Maternal Grandfather    Hypertension Maternal Grandfather    Diabetes Maternal Grandmother    Breast cancer Paternal Grandmother     Social History:  reports that she has never smoked. She has never used smokeless tobacco. She reports that she does not currently use alcohol. She reports that she does not use drugs.  Review of Systems   She has been told to have  rheumatoid arthritis, On Plaquenil   History of PCOS: Her menstrual cycles previously had been regular but not she has not had any since 07/2020 She was told that this may be related to tamoxifen  Free testosterone levels as follows:  Lab Results  Component Value Date   TESTOFREE 3.7 04/08/2021   TESTOFREE 4.4 (H) 12/14/2020   TESTOFREE 3.3 10/18/2018     Labs:  Appointment on 10/14/2021  Component Date Value Ref Range Status  Free T4 10/14/2021 1.19  0.60 - 1.60 ng/dL Final   Comment: Specimens from patients who are undergoing biotin therapy and /or ingesting biotin supplements may contain high levels of biotin.  The higher biotin concentration in these specimens interferes with this Free T4 assay.  Specimens that contain high levels  of biotin may cause false high results for this Free T4 assay.  Please interpret results in light of the total clinical presentation of the patient.     TSH 10/14/2021 1.93  0.35 - 5.50 uIU/mL Final   Cholesterol 10/14/2021 146  0 - 200 mg/dL Final   ATP III Classification       Desirable:  < 200 mg/dL               Borderline High:  200 - 239 mg/dL          High:  > = 240 mg/dL   Triglycerides 10/14/2021 135.0  0.0 - 149.0 mg/dL Final   Normal:  <150 mg/dLBorderline High:  150 - 199 mg/dL   HDL 10/14/2021 42.60  >39.00 mg/dL Final   VLDL 10/14/2021 27.0  0.0 - 40.0 mg/dL Final   LDL Cholesterol 10/14/2021 76  0 - 99 mg/dL Final   Total CHOL/HDL Ratio 10/14/2021 3   Final                  Men          Women1/2 Average Risk     3.4          3.3Average Risk          5.0          4.42X Average Risk          9.6          7.13X Average Risk          15.0          11.0                       NonHDL 10/14/2021 103.12   Final   NOTE:  Non-HDL goal should be 30 mg/dL higher than patient's LDL goal (i.e. LDL goal of < 70 mg/dL, would have non-HDL goal of < 100 mg/dL)   Sodium 10/14/2021 137  135 - 145 mEq/L Final   Potassium 10/14/2021 3.5  3.5 - 5.1 mEq/L  Final   Chloride 10/14/2021 102  96 - 112 mEq/L Final   CO2 10/14/2021 27  19 - 32 mEq/L Final   Glucose, Bld 10/14/2021 84  70 - 99 mg/dL Final   BUN 10/14/2021 18  6 - 23 mg/dL Final   Creatinine, Ser 10/14/2021 0.75  0.40 - 1.20 mg/dL Final   Total Bilirubin 10/14/2021 0.3  0.2 - 1.2 mg/dL Final   Alkaline Phosphatase 10/14/2021 27 (L)  39 - 117 U/L Final   AST 10/14/2021 15  0 - 37 U/L Final   ALT 10/14/2021 13  0 - 35 U/L Final   Total Protein 10/14/2021 6.7  6.0 - 8.3 g/dL Final   Albumin 10/14/2021 4.1  3.5 - 5.2 g/dL Final   GFR 10/14/2021 93.27  >60.00 mL/min Final   Calculated using the CKD-EPI Creatinine Equation (2021)   Calcium 10/14/2021 9.3  8.4 - 10.5 mg/dL Final   Hgb A1c MFr Bld 10/14/2021 5.4  4.6 - 6.5 % Final   Glycemic Control Guidelines for People with Diabetes:Non Diabetic:  <6%Goal of Therapy: <7%Additional Action Suggested:  >8%  EXAM:  BP 122/76   Pulse 76   Ht 5\' 2"  (1.575 m)   Wt 236 lb 12.8 oz (107.4 kg)   SpO2 98%   BMI 43.31 kg/m    Thyroid is rubbery and smooth, twice normal on the right and about 1-1/2 times normal on the left  Assessment/Plan:   OBESITY with BMI over 40:  She has been able to start losing weight with adding phentermine 15 mg every other day and now Topamax daily also  She is tolerating her medications without side effects She has been able to lose weight with modifications of diet, portions and exercise  She will continue to take Topamax daily while taking phentermine every other day   Hypothyroidism: Mild and well controlled with 75 mcg levothyroxine    LIPIDS: Well controlled, mostly has had high triglycerides which are controlled with fenofibrate With weight loss and better diet her LDL is also improved down to 76   HYPERTENSION: This is well-controlled on Zestoretic 10/12.5   PCOS: Free testosterone better with increasing metformin and some weight loss    There are no Patient Instructions on file for  this visit.     Elayne Snare 10/17/2021, 4:28 PM

## 2021-11-01 ENCOUNTER — Other Ambulatory Visit (HOSPITAL_COMMUNITY): Payer: Self-pay

## 2021-11-10 ENCOUNTER — Other Ambulatory Visit: Payer: Self-pay | Admitting: Endocrinology

## 2021-11-11 ENCOUNTER — Other Ambulatory Visit (HOSPITAL_COMMUNITY): Payer: Self-pay

## 2021-11-11 MED ORDER — LISINOPRIL-HYDROCHLOROTHIAZIDE 10-12.5 MG PO TABS
1.0000 | ORAL_TABLET | Freq: Every day | ORAL | 1 refills | Status: DC
  Filled 2021-11-11: qty 90, 90d supply, fill #0
  Filled 2022-02-10: qty 90, 90d supply, fill #1

## 2021-11-28 DIAGNOSIS — E282 Polycystic ovarian syndrome: Secondary | ICD-10-CM | POA: Diagnosis not present

## 2021-11-28 DIAGNOSIS — Z0000 Encounter for general adult medical examination without abnormal findings: Secondary | ICD-10-CM | POA: Diagnosis not present

## 2021-11-28 DIAGNOSIS — R7301 Impaired fasting glucose: Secondary | ICD-10-CM | POA: Diagnosis not present

## 2021-11-28 DIAGNOSIS — Z23 Encounter for immunization: Secondary | ICD-10-CM | POA: Diagnosis not present

## 2021-11-28 DIAGNOSIS — E039 Hypothyroidism, unspecified: Secondary | ICD-10-CM | POA: Diagnosis not present

## 2021-11-28 DIAGNOSIS — M069 Rheumatoid arthritis, unspecified: Secondary | ICD-10-CM | POA: Diagnosis not present

## 2021-11-28 DIAGNOSIS — Z1211 Encounter for screening for malignant neoplasm of colon: Secondary | ICD-10-CM | POA: Diagnosis not present

## 2021-11-28 DIAGNOSIS — E785 Hyperlipidemia, unspecified: Secondary | ICD-10-CM | POA: Diagnosis not present

## 2021-11-28 DIAGNOSIS — I10 Essential (primary) hypertension: Secondary | ICD-10-CM | POA: Diagnosis not present

## 2021-12-02 ENCOUNTER — Other Ambulatory Visit (HOSPITAL_COMMUNITY): Payer: Self-pay

## 2021-12-02 ENCOUNTER — Other Ambulatory Visit: Payer: Self-pay | Admitting: Endocrinology

## 2021-12-02 DIAGNOSIS — E063 Autoimmune thyroiditis: Secondary | ICD-10-CM

## 2021-12-03 ENCOUNTER — Other Ambulatory Visit (HOSPITAL_COMMUNITY): Payer: Self-pay

## 2021-12-03 MED ORDER — LEVOTHYROXINE SODIUM 75 MCG PO TABS
75.0000 ug | ORAL_TABLET | Freq: Every day | ORAL | 1 refills | Status: DC
  Filled 2021-12-03: qty 90, 90d supply, fill #0
  Filled 2022-03-16: qty 90, 90d supply, fill #1

## 2021-12-03 MED ORDER — DICLOFENAC SODIUM 75 MG PO TBEC
75.0000 mg | DELAYED_RELEASE_TABLET | Freq: Two times a day (BID) | ORAL | 2 refills | Status: DC
Start: 2021-12-03 — End: 2022-02-26
  Filled 2021-12-03: qty 60, 30d supply, fill #0
  Filled 2022-01-01: qty 60, 30d supply, fill #1
  Filled 2022-01-29: qty 60, 30d supply, fill #2

## 2021-12-03 MED ORDER — METFORMIN HCL ER 750 MG PO TB24
1500.0000 mg | ORAL_TABLET | Freq: Every day | ORAL | 1 refills | Status: DC
  Filled 2021-12-03: qty 180, 90d supply, fill #0
  Filled 2022-02-26: qty 180, 90d supply, fill #1

## 2022-01-01 ENCOUNTER — Other Ambulatory Visit (HOSPITAL_COMMUNITY): Payer: Self-pay

## 2022-01-03 ENCOUNTER — Other Ambulatory Visit (HOSPITAL_COMMUNITY): Payer: Self-pay

## 2022-01-06 ENCOUNTER — Other Ambulatory Visit (HOSPITAL_COMMUNITY): Payer: Self-pay

## 2022-01-06 MED ORDER — HYDROXYCHLOROQUINE SULFATE 200 MG PO TABS
200.0000 mg | ORAL_TABLET | Freq: Two times a day (BID) | ORAL | 3 refills | Status: DC
  Filled 2022-01-06: qty 60, 30d supply, fill #0
  Filled 2022-01-29 – 2022-02-02 (×2): qty 60, 30d supply, fill #1
  Filled 2022-02-26: qty 60, 30d supply, fill #2
  Filled 2022-04-01: qty 60, 30d supply, fill #3

## 2022-01-29 ENCOUNTER — Other Ambulatory Visit (HOSPITAL_COMMUNITY): Payer: Self-pay

## 2022-02-03 ENCOUNTER — Other Ambulatory Visit (HOSPITAL_COMMUNITY): Payer: Self-pay

## 2022-02-10 ENCOUNTER — Other Ambulatory Visit (HOSPITAL_COMMUNITY): Payer: Self-pay

## 2022-02-26 ENCOUNTER — Other Ambulatory Visit (HOSPITAL_COMMUNITY): Payer: Self-pay

## 2022-02-26 MED ORDER — DICLOFENAC SODIUM 75 MG PO TBEC
75.0000 mg | DELAYED_RELEASE_TABLET | Freq: Two times a day (BID) | ORAL | 2 refills | Status: DC
Start: 2022-02-26 — End: 2022-05-26
  Filled 2022-02-26: qty 60, 30d supply, fill #0
  Filled 2022-04-01: qty 60, 30d supply, fill #1
  Filled 2022-04-29: qty 60, 30d supply, fill #2

## 2022-02-28 ENCOUNTER — Other Ambulatory Visit (HOSPITAL_COMMUNITY): Payer: Self-pay

## 2022-03-17 ENCOUNTER — Other Ambulatory Visit (HOSPITAL_COMMUNITY): Payer: Self-pay

## 2022-04-01 ENCOUNTER — Other Ambulatory Visit (HOSPITAL_COMMUNITY): Payer: Self-pay

## 2022-04-01 ENCOUNTER — Other Ambulatory Visit: Payer: Self-pay | Admitting: Endocrinology

## 2022-04-02 ENCOUNTER — Other Ambulatory Visit (HOSPITAL_COMMUNITY): Payer: Self-pay

## 2022-04-02 MED ORDER — FENOFIBRATE 145 MG PO TABS
145.0000 mg | ORAL_TABLET | Freq: Every day | ORAL | 5 refills | Status: DC
  Filled 2022-04-02: qty 30, 30d supply, fill #0
  Filled 2022-04-29: qty 30, 30d supply, fill #1
  Filled 2022-05-26: qty 30, 30d supply, fill #2
  Filled 2022-06-27: qty 30, 30d supply, fill #3
  Filled 2022-07-28: qty 30, 30d supply, fill #4
  Filled 2022-08-27: qty 30, 30d supply, fill #5

## 2022-04-14 ENCOUNTER — Other Ambulatory Visit (INDEPENDENT_AMBULATORY_CARE_PROVIDER_SITE_OTHER): Payer: 59

## 2022-04-14 DIAGNOSIS — R7301 Impaired fasting glucose: Secondary | ICD-10-CM

## 2022-04-14 DIAGNOSIS — E063 Autoimmune thyroiditis: Secondary | ICD-10-CM

## 2022-04-14 DIAGNOSIS — E782 Mixed hyperlipidemia: Secondary | ICD-10-CM | POA: Diagnosis not present

## 2022-04-14 DIAGNOSIS — E282 Polycystic ovarian syndrome: Secondary | ICD-10-CM | POA: Diagnosis not present

## 2022-04-14 LAB — T4, FREE: Free T4: 1.09 ng/dL (ref 0.60–1.60)

## 2022-04-14 LAB — TSH: TSH: 1.69 u[IU]/mL (ref 0.35–5.50)

## 2022-04-14 LAB — HEMOGLOBIN A1C: Hgb A1c MFr Bld: 5.2 % (ref 4.6–6.5)

## 2022-04-15 LAB — COMPREHENSIVE METABOLIC PANEL
ALT: 13 U/L (ref 0–35)
AST: 13 U/L (ref 0–37)
Albumin: 4 g/dL (ref 3.5–5.2)
Alkaline Phosphatase: 28 U/L — ABNORMAL LOW (ref 39–117)
BUN: 18 mg/dL (ref 6–23)
CO2: 22 mEq/L (ref 19–32)
Calcium: 9.7 mg/dL (ref 8.4–10.5)
Chloride: 102 mEq/L (ref 96–112)
Creatinine, Ser: 0.81 mg/dL (ref 0.40–1.20)
GFR: 84.74 mL/min (ref >60.00)
Glucose, Bld: 85 mg/dL (ref 70–99)
Potassium: 3.8 mEq/L (ref 3.5–5.1)
Sodium: 139 mEq/L (ref 135–145)
Total Bilirubin: 0.3 mg/dL (ref 0.2–1.2)
Total Protein: 6.6 g/dL (ref 6.0–8.3)

## 2022-04-15 LAB — LIPID PANEL
Cholesterol: 119 mg/dL (ref 0–200)
HDL: 39.6 mg/dL (ref >39.00)
LDL Cholesterol: 51 mg/dL (ref 0–99)
NonHDL: 79.6
Total CHOL/HDL Ratio: 3
Triglycerides: 144 mg/dL (ref 0.0–149.0)
VLDL: 28.8 mg/dL (ref 0.0–40.0)

## 2022-04-16 LAB — TESTOSTERONE, FREE, TOTAL, SHBG
Sex Hormone Binding: 88.4 nmol/L (ref 17.3–125.0)
Testosterone, Free: 3.3 pg/mL (ref 0.0–4.2)
Testosterone: 43 ng/dL (ref 4–50)

## 2022-04-17 ENCOUNTER — Other Ambulatory Visit (HOSPITAL_COMMUNITY): Payer: Self-pay

## 2022-04-17 ENCOUNTER — Encounter: Payer: Self-pay | Admitting: Endocrinology

## 2022-04-17 ENCOUNTER — Ambulatory Visit: Payer: 59 | Admitting: Endocrinology

## 2022-04-17 VITALS — BP 108/68 | HR 86 | Ht 61.5 in | Wt 234.2 lb

## 2022-04-17 DIAGNOSIS — M0589 Other rheumatoid arthritis with rheumatoid factor of multiple sites: Secondary | ICD-10-CM | POA: Diagnosis not present

## 2022-04-17 DIAGNOSIS — M2550 Pain in unspecified joint: Secondary | ICD-10-CM | POA: Diagnosis not present

## 2022-04-17 DIAGNOSIS — I10 Essential (primary) hypertension: Secondary | ICD-10-CM

## 2022-04-17 DIAGNOSIS — M79642 Pain in left hand: Secondary | ICD-10-CM | POA: Diagnosis not present

## 2022-04-17 DIAGNOSIS — R7301 Impaired fasting glucose: Secondary | ICD-10-CM

## 2022-04-17 DIAGNOSIS — Z6841 Body Mass Index (BMI) 40.0 and over, adult: Secondary | ICD-10-CM | POA: Diagnosis not present

## 2022-04-17 DIAGNOSIS — E782 Mixed hyperlipidemia: Secondary | ICD-10-CM | POA: Diagnosis not present

## 2022-04-17 DIAGNOSIS — D0511 Intraductal carcinoma in situ of right breast: Secondary | ICD-10-CM | POA: Diagnosis not present

## 2022-04-17 DIAGNOSIS — Z79899 Other long term (current) drug therapy: Secondary | ICD-10-CM | POA: Diagnosis not present

## 2022-04-17 DIAGNOSIS — M79641 Pain in right hand: Secondary | ICD-10-CM | POA: Diagnosis not present

## 2022-04-17 MED ORDER — LISINOPRIL 10 MG PO TABS
10.0000 mg | ORAL_TABLET | Freq: Every day | ORAL | 3 refills | Status: DC
  Filled 2022-04-17: qty 90, 90d supply, fill #0
  Filled 2022-07-11: qty 90, 90d supply, fill #1
  Filled 2022-10-09: qty 90, 90d supply, fill #2

## 2022-04-17 NOTE — Progress Notes (Signed)
Patient ID: [name], female   DOB: 1971/08/29, 51 y.o.   MRN: [id number]    Chief complaint: Followup of various problems   History of Present Illness:  OBESITY:    In 8/16 she was started on Belviq and with this she had previously lost over 20 pounds with baseline weight of 258 pounds However this was stopped because of lack of insurance coverage  Ozempic was denied by her insurance despite history of impaired fasting glucose  She is taking phentermine since 11/07/2020 Since she felt jittery with this she is taking this every other day   She is also taking 25 mg topiramate every day which helps her better control her appetite  She is trying to cut back on portions and carbohydrates  She tries to exercise with walking during lunchtime regularly She has lost about 2  pounds   Wt Readings from Last 3 Encounters:  04/17/22 234 lb 3.2 oz (106.2 kg)  10/17/21 236 lb 12.8 oz (107.4 kg)  07/22/21 243 lb 14.4 oz (110.6 kg)    HYPERTENSION:  She has had hypertension since at least 2004. Has been on ACE inhibitor since about 2005 Blood pressure is consistently controlled with lisinopril HCTZ  She has not had recent blood pressure measurements elsewhere but does not complain of feeling lightheaded Blood pressure is lower than usual   BP Readings from Last 3 Encounters:  04/17/22 108/68  10/17/21 122/76  07/22/21 134/68      IMPAIRED FASTING GLUCOSE:  Her fasting glucose is now consistently below 100 Fasting glucose has been as high as 113  She has been  on metformin ER 750 mg, taking 2 tablets in the evenings   A1c has been in the NORMAL range consistently   Lab Results  Component Value Date   HGBA1C 5.2 04/14/2022   HGBA1C 5.4 10/14/2021   HGBA1C 5.0 12/14/2020   Lab Results  Component Value Date   LDLCALC 51 04/14/2022   CREATININE 0.81 04/14/2022    HYPOTHYROIDISM:  She has had a goiter since at least 1998. Baseline TSH was 5.4  when she was started on levothyroxine in 2000 and has required small doses of levothyroxine No unusual fatigue  Has not required a change in her 75 g dose for the last few years TSH consistently normal   Lab Results  Component Value Date   TSH 1.69 04/14/2022   TSH 1.93 10/14/2021   TSH 1.69 12/14/2020   FREET4 1.09 04/14/2022   FREET4 1.19 10/14/2021   FREET4 1.25 12/14/2020     HYPERLIPIDEMIA: She has had diabetic dyslipidemia with HDL as low as 23 and high triglycerides. Previously on Crestor and fenofibrate combination  She was on Crestor and 2/19 was on 10 mg but subsequently LDL has been below 100 without statin  Triglycerides are now consistently normal Taking fenofibrate 145 mg daily  LDL below 100  Lab Results  Component Value Date   CHOL 119 04/14/2022   CHOL 146 10/14/2021   CHOL 159 04/08/2021   Lab Results  Component Value Date   HDL 39.60 04/14/2022   HDL 42.60 10/14/2021   HDL 43.60 04/08/2021   Lab Results  Component Value Date   LDLCALC 51 04/14/2022   LDLCALC 76 10/14/2021   LDLCALC 83 04/08/2021   Lab Results  Component Value Date   TRIG 144.0 04/14/2022   TRIG 135.0 10/14/2021   TRIG 162.0 (H) 04/08/2021   Lab  Results  Component Value Date   CHOLHDL 3 04/14/2022   CHOLHDL 3 10/14/2021   CHOLHDL 4 04/08/2021   Lab Results  Component Value Date   LDLDIRECT 73.0 04/09/2020   LDLDIRECT 113.0 02/15/2019   LDLDIRECT 122.0 10/18/2018      Allergies as of 04/17/2022   No Known Allergies      Medication List        Accurate as of Apr 17, 2022  3:01 PM. If you have any questions, ask your nurse or doctor.          diclofenac 75 MG EC tablet Commonly known as: VOLTAREN Take 75 mg by mouth 2 (two) times daily.   diclofenac 75 MG EC tablet Commonly known as: VOLTAREN Take 1 tablet (75 mg total) by mouth 2 (two) times daily.   fenofibrate 145 MG tablet Commonly known as: TRICOR Take 1 tablet (145 mg total) by mouth  daily.   hydroxychloroquine 200 MG tablet Commonly known as: PLAQUENIL TAKE 1 TABLET BY MOUTH TWICE A DAY WITH FOOD OR MILK   hydroxychloroquine 200 MG tablet Commonly known as: PLAQUENIL Take 1 tablet (200 mg total) by mouth 2 (two) times daily with food or milk   lisinopril-hydrochlorothiazide 10-12.5 MG tablet Commonly known as: ZESTORETIC Take 1 tablet by mouth daily.   metFORMIN 750 MG 24 hr tablet Commonly known as: GLUCOPHAGE-XR Take 2 tablets (1,500 mg total) by mouth daily.   phentermine 15 MG capsule Take 1 capsule (15 mg total) by mouth every morning.   Synthroid 75 MCG tablet Generic drug: levothyroxine Take 1 tablet (75 mcg total) by mouth daily before breakfast.   tamoxifen 20 MG tablet Commonly known as: NOLVADEX Take 1 tablet (20 mg total) by mouth daily.   topiramate 25 MG tablet Commonly known as: Topamax Take 1 tablet (25 mg total) by mouth 2 (two) times daily.        Allergies: No Known Allergies  Past Medical History:  Diagnosis Date   Arthritis    RA   Breast cancer ([place]) 01/2019   Right Breast Cancer   Family history of breast cancer    Hypertension    Hypothyroidism    Personal history of radiation therapy 2020   Right Breast Cancer    Past Surgical History:  Procedure Laterality Date   BREAST LUMPECTOMY Right 04/20/2019   BREAST LUMPECTOMY WITH RADIOACTIVE SEED LOCALIZATION Right 04/20/2019   Procedure: RIGHT BREAST LUMPECTOMY WITH RADIOACTIVE SEED LOCALIZATION;  Surgeon: Erroll Luna, MD;  Location: [facility];  Service: General;  Laterality: Right;   CESAREAN SECTION     x2    Family History  Problem Relation Age of Onset   Thyroid disease Mother    Diabetes Maternal Grandfather    Hypertension Maternal Grandfather    Diabetes Maternal Grandmother    Breast cancer Paternal Grandmother     Social History:  reports that she has never smoked. She has never used smokeless tobacco. She reports that she does  not currently use alcohol. She reports that she does not use drugs.  Review of Systems   She has been told to have rheumatoid arthritis, On Plaquenil   History of PCOS: Her menstrual cycles previously had been regular but not she has not had any since 07/2020 She does have some hot flashes but these are possibly from tamoxifen  Free testosterone levels as follows:  Lab Results  Component Value Date   TESTOFREE 3.3 04/14/2022   TESTOFREE 3.7 04/08/2021  TESTOFREE 4.4 (H) 12/14/2020   TESTOFREE 3.3 10/18/2018     Labs:  Lab on 04/14/2022  Component Date Value Ref Range Status   Testosterone 04/14/2022 43  4 - 50 ng/dL Final   Testosterone, Free 04/14/2022 3.3  0.0 - 4.2 pg/mL Final   Sex Hormone Binding 04/14/2022 88.4  17.3 - 125.0 nmol/L Final   Free T4 04/14/2022 1.09  0.60 - 1.60 ng/dL Final   Comment: Specimens from patients who are undergoing biotin therapy and /or ingesting biotin supplements may contain high levels of biotin.  The higher biotin concentration in these specimens interferes with this Free T4 assay.  Specimens that contain high levels  of biotin may cause false high results for this Free T4 assay.  Please interpret results in light of the total clinical presentation of the patient.     TSH 04/14/2022 1.69  0.35 - 5.50 uIU/mL Final   Cholesterol 04/14/2022 119  0 - 200 mg/dL Final   ATP III Classification       Desirable:  < 200 mg/dL               Borderline High:  200 - 239 mg/dL          High:  > = 240 mg/dL   Triglycerides 04/14/2022 144.0  0.0 - 149.0 mg/dL Final   Normal:  <150 mg/dLBorderline High:  150 - 199 mg/dL   HDL 04/14/2022 39.60  >39.00 mg/dL Final   VLDL 04/14/2022 28.8  0.0 - 40.0 mg/dL Final   LDL Cholesterol 04/14/2022 51  0 - 99 mg/dL Final   Total CHOL/HDL Ratio 04/14/2022 3   Final                  Men          Women1/2 Average Risk     3.4          3.3Average Risk          5.0          4.42X Average Risk          9.6          7.13X  Average Risk          15.0          11.0                       NonHDL 04/14/2022 79.60   Final   NOTE:  Non-HDL goal should be 30 mg/dL higher than patient's LDL goal (i.e. LDL goal of < 70 mg/dL, would have non-HDL goal of < 100 mg/dL)   Sodium 04/14/2022 139  135 - 145 mEq/L Final   Potassium 04/14/2022 3.8  3.5 - 5.1 mEq/L Final   Chloride 04/14/2022 102  96 - 112 mEq/L Final   CO2 04/14/2022 22  19 - 32 mEq/L Final   Glucose, Bld 04/14/2022 85  70 - 99 mg/dL Final   BUN 04/14/2022 18  6 - 23 mg/dL Final   Creatinine, Ser 04/14/2022 0.81  0.40 - 1.20 mg/dL Final   Total Bilirubin 04/14/2022 0.3  0.2 - 1.2 mg/dL Final   Alkaline Phosphatase 04/14/2022 28 (L)  39 - 117 U/L Final   AST 04/14/2022 13  0 - 37 U/L Final   ALT 04/14/2022 13  0 - 35 U/L Final   Total Protein 04/14/2022 6.6  6.0 - 8.3 g/dL Final   Albumin 04/14/2022 4.0  3.5 - 5.2 g/dL  Final   GFR 04/14/2022 84.74  >60.00 mL/min Final   Calculated using the CKD-EPI Creatinine Equation (2021)   Calcium 04/14/2022 9.7  8.4 - 10.5 mg/dL Final   Hgb A1c MFr Bld 04/14/2022 5.2  4.6 - 6.5 % Final   Glycemic Control Guidelines for People with Diabetes:Non Diabetic:  <6%Goal of Therapy: <7%Additional Action Suggested:  >8%       EXAM:  BP 108/68 (Patient Position: Standing)   Pulse 86   Ht 5' 1.5" (1.562 m)   Wt 234 lb 3.2 oz (106.2 kg)   SpO2 98%   BMI 43.54 kg/m    Repeat blood pressure 118/68  Assessment/Plan:   OBESITY with BMI over 40:  She has been able to maintain some weight loss with phentermine 15 mg every other day and 25 mg Topamax daily also  She is tolerating her medications without side effects Doing well with exercise also  She will continue to take Topamax daily while taking phentermine every other day   Hypothyroidism: Mild and consistently controlled with 75 mcg levothyroxine    LIPIDS: Well controlled, LDL is down further likely from better diet Triglycerides consistently controlled with  fenofibrate   HYPERTENSION: Blood pressure is low normal she can stop HCTZ and continue lisinopril only at 10 mg dose   PCOS: Free testosterone consistently controlled with metformin and some weight loss She can continue this for benefits on insulin sensitivity   There are no Patient Instructions on file for this visit.     Elayne Snare 04/17/2022, 3:01 PM

## 2022-04-29 ENCOUNTER — Other Ambulatory Visit (HOSPITAL_COMMUNITY): Payer: Self-pay

## 2022-05-01 ENCOUNTER — Other Ambulatory Visit (HOSPITAL_COMMUNITY): Payer: Self-pay

## 2022-05-01 MED ORDER — HYDROXYCHLOROQUINE SULFATE 200 MG PO TABS
200.0000 mg | ORAL_TABLET | Freq: Two times a day (BID) | ORAL | 5 refills | Status: DC
  Filled 2022-05-01: qty 60, 30d supply, fill #0
  Filled 2022-05-26: qty 60, 30d supply, fill #1
  Filled 2022-06-27: qty 60, 30d supply, fill #2
  Filled 2022-07-28: qty 60, 30d supply, fill #3
  Filled 2022-08-27: qty 60, 30d supply, fill #4
  Filled 2022-09-23: qty 60, 30d supply, fill #5

## 2022-05-26 ENCOUNTER — Other Ambulatory Visit (HOSPITAL_COMMUNITY): Payer: Self-pay

## 2022-05-26 ENCOUNTER — Other Ambulatory Visit: Payer: Self-pay | Admitting: Endocrinology

## 2022-05-26 MED ORDER — DICLOFENAC SODIUM 75 MG PO TBEC
75.0000 mg | DELAYED_RELEASE_TABLET | Freq: Two times a day (BID) | ORAL | 2 refills | Status: DC
  Filled 2022-05-26: qty 60, 30d supply, fill #0
  Filled 2022-06-27: qty 60, 30d supply, fill #1
  Filled 2022-07-28: qty 60, 30d supply, fill #2

## 2022-05-26 MED ORDER — METFORMIN HCL ER 750 MG PO TB24
1500.0000 mg | ORAL_TABLET | Freq: Every day | ORAL | 1 refills | Status: DC
  Filled 2022-05-26: qty 180, 90d supply, fill #0
  Filled 2022-08-27: qty 180, 90d supply, fill #1

## 2022-05-29 DIAGNOSIS — Z1231 Encounter for screening mammogram for malignant neoplasm of breast: Secondary | ICD-10-CM | POA: Diagnosis not present

## 2022-05-29 DIAGNOSIS — Z01419 Encounter for gynecological examination (general) (routine) without abnormal findings: Secondary | ICD-10-CM | POA: Diagnosis not present

## 2022-06-12 ENCOUNTER — Other Ambulatory Visit: Payer: Self-pay | Admitting: Endocrinology

## 2022-06-12 ENCOUNTER — Other Ambulatory Visit (HOSPITAL_COMMUNITY): Payer: Self-pay

## 2022-06-12 DIAGNOSIS — E063 Autoimmune thyroiditis: Secondary | ICD-10-CM

## 2022-06-13 ENCOUNTER — Other Ambulatory Visit (HOSPITAL_COMMUNITY): Payer: Self-pay

## 2022-06-13 MED ORDER — LEVOTHYROXINE SODIUM 75 MCG PO TABS
75.0000 ug | ORAL_TABLET | Freq: Every day | ORAL | 1 refills | Status: DC
  Filled 2022-06-13: qty 90, 90d supply, fill #0
  Filled 2022-09-16: qty 90, 90d supply, fill #1

## 2022-06-19 ENCOUNTER — Other Ambulatory Visit (HOSPITAL_COMMUNITY): Payer: Self-pay

## 2022-06-19 MED ORDER — PEG 3350-KCL-NA BICARB-NACL 420 G PO SOLR
ORAL | 0 refills | Status: DC
  Filled 2022-06-19: qty 4000, 1d supply, fill #0

## 2022-06-20 ENCOUNTER — Other Ambulatory Visit (HOSPITAL_COMMUNITY): Payer: Self-pay

## 2022-06-26 ENCOUNTER — Other Ambulatory Visit (HOSPITAL_COMMUNITY): Payer: Self-pay

## 2022-06-27 ENCOUNTER — Other Ambulatory Visit (HOSPITAL_COMMUNITY): Payer: Self-pay

## 2022-07-08 DIAGNOSIS — N939 Abnormal uterine and vaginal bleeding, unspecified: Secondary | ICD-10-CM | POA: Diagnosis not present

## 2022-07-11 ENCOUNTER — Other Ambulatory Visit (HOSPITAL_COMMUNITY): Payer: Self-pay

## 2022-07-16 DIAGNOSIS — K648 Other hemorrhoids: Secondary | ICD-10-CM | POA: Diagnosis not present

## 2022-07-16 DIAGNOSIS — D124 Benign neoplasm of descending colon: Secondary | ICD-10-CM | POA: Diagnosis not present

## 2022-07-16 DIAGNOSIS — K529 Noninfective gastroenteritis and colitis, unspecified: Secondary | ICD-10-CM | POA: Diagnosis not present

## 2022-07-16 DIAGNOSIS — K6289 Other specified diseases of anus and rectum: Secondary | ICD-10-CM | POA: Diagnosis not present

## 2022-07-16 DIAGNOSIS — K5289 Other specified noninfective gastroenteritis and colitis: Secondary | ICD-10-CM | POA: Diagnosis not present

## 2022-07-16 DIAGNOSIS — Z1211 Encounter for screening for malignant neoplasm of colon: Secondary | ICD-10-CM | POA: Diagnosis not present

## 2022-07-16 DIAGNOSIS — D125 Benign neoplasm of sigmoid colon: Secondary | ICD-10-CM | POA: Diagnosis not present

## 2022-07-16 DIAGNOSIS — K6389 Other specified diseases of intestine: Secondary | ICD-10-CM | POA: Diagnosis not present

## 2022-07-16 DIAGNOSIS — Z8371 Family history of colonic polyps: Secondary | ICD-10-CM | POA: Diagnosis not present

## 2022-07-16 DIAGNOSIS — K5730 Diverticulosis of large intestine without perforation or abscess without bleeding: Secondary | ICD-10-CM | POA: Diagnosis not present

## 2022-07-20 NOTE — Progress Notes (Signed)
Patient Care Team: Lennie Odor, [place] as PCP - General (Nurse Practitioner) Kyung Rudd, MD as Consulting Physician (Radiation Oncology) Erroll Luna, MD as Consulting Physician (General Surgery) Nicholas Lose, MD as Consulting Physician (Hematology and Oncology)  DIAGNOSIS: No diagnosis found.  SUMMARY OF ONCOLOGIC HISTORY: Oncology History  Malignant neoplasm of upper-outer quadrant of right breast in female, estrogen receptor positive ([place])  03/01/2019 Initial Diagnosis   Diagnostic mammogram detected 2.5cm calcifications in right breast. 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    03/09/2019 Cancer Staging   Staging form: Breast, AJCC 8th Edition - Clinical stage from 03/09/2019: Stage 0 (cTis (DCIS), cN0, cM0, ER+, PR+) - Signed by Gardenia Phlegm, NP on 03/09/2019   04/06/2019 Genetic Testing   Negative genetic testing on the common hereditary cancer panel.  The Common Hereditary Gene Panel offered by [facility] includes sequencing and/or deletion duplication testing of the following 48 genes: APC, ATM, AXIN2, BARD1, BMPR1A, BRCA1, BRCA2, BRIP1, CDH1, CDK4, CDKN2A (p14ARF), CDKN2A (p16INK4a), CHEK2, CTNNA1, DICER1, EPCAM (Deletion/duplication testing only), GREM1 (promoter region deletion/duplication testing only), KIT, MEN1, MLH1, MSH2, MSH3, MSH6, MUTYH, NBN, NF1, NHTL1, PALB2, PDGFRA, PMS2, POLD1, POLE, PTEN, RAD50, RAD51C, RAD51D, RNF43, SDHB, SDHC, SDHD, SMAD4, SMARCA4. STK11, TP53, TSC1, TSC2, and VHL.  The following genes were evaluated for sequence changes only: SDHA and HOXB13 c.251G>A variant only. The report date is Apr 06, 2019.   04/20/2019 Surgery   Right lumpectomy ([facility]): DCIS, 1.5cm, intermediate grade, ER+ 95%, PR+ 95%, clear margins.    04/20/2019 Cancer Staging   Staging form: Breast, AJCC 8th Edition - Pathologic stage from 04/20/2019: Stage 0 (pTis (DCIS), pN0, cM0, ER+, PR+) - Signed by Gardenia Phlegm, NP on 10/23/2019    05/31/2019 -  Radiation Therapy   Adjuvant XRT   08/2019 -  Anti-estrogen oral therapy   Tamoxifen daily     CHIEF COMPLIANT: Follow-up on tamoxifen therapy    INTERVAL HISTORY: Nicole Schwartz is a 51 y.o. with above-mentioned history of right breast DCIS who underwent a lumpectomy and radiation therapy, currently on antiestrogen therapy with tamoxifen. She presents to the clinic today for follow-up.     ALLERGIES:  has No Known Allergies.  MEDICATIONS:  Current Outpatient Medications  Medication Sig Dispense Refill   diclofenac (VOLTAREN) 75 MG EC tablet Take 75 mg by mouth 2 (two) times daily.  2   diclofenac (VOLTAREN) 75 MG EC tablet Take 1 tablet (75 mg total) by mouth 2 (two) times daily. 60 tablet 2   fenofibrate (TRICOR) 145 MG tablet Take 1 tablet (145 mg total) by mouth daily. 30 tablet 5   hydroxychloroquine (PLAQUENIL) 200 MG tablet TAKE 1 TABLET BY MOUTH TWICE A DAY WITH FOOD OR MILK  2   hydroxychloroquine (PLAQUENIL) 200 MG tablet Take 1 tablet (200 mg total) by mouth 2 (two) times daily with food or milk. 60 tablet 5   levothyroxine (SYNTHROID) 75 MCG tablet Take 1 tablet (75 mcg total) by mouth daily before breakfast. 90 tablet 1   lisinopril (ZESTRIL) 10 MG tablet Take 1 tablet (10 mg total) by mouth daily. 90 tablet 3   metFORMIN (GLUCOPHAGE-XR) 750 MG 24 hr tablet Take 2 tablets (1,500 mg total) by mouth daily. 180 tablet 1   phentermine 15 MG capsule Take 1 capsule (15 mg total) by mouth every morning. 30 capsule 3   polyethylene glycol-electrolytes (NULYTELY) 420 g solution use as directed 4000 mL 0   tamoxifen (NOLVADEX) 20 MG tablet  Take 1 tablet (20 mg total) by mouth daily. 90 tablet 3   topiramate (TOPAMAX) 25 MG tablet Take 1 tablet (25 mg total) by mouth 2 (two) times daily. 90 tablet 5   No current facility-administered medications for this visit.    PHYSICAL EXAMINATION: ECOG PERFORMANCE STATUS: {CHL ONC ECOG PS:[phone]}  There were no  vitals filed for this visit. There were no vitals filed for this visit.  BREAST:*** No palpable masses or nodules in either right or left breasts. No palpable axillary supraclavicular or infraclavicular adenopathy no breast tenderness or nipple discharge. (exam performed in the presence of a chaperone)  LABORATORY DATA:  I have reviewed the data as listed    Latest Ref Rng & Units 04/14/2022   11:10 AM 10/14/2021   11:18 AM 04/08/2021   11:20 AM  CMP  Glucose 70 - 99 mg/dL 85  84  86   BUN 6 - 23 mg/dL 18  18    Creatinine 0.40 - 1.20 mg/dL 0.81  0.75    Sodium 135 - 145 mEq/L 139  137    Potassium 3.5 - 5.1 mEq/L 3.8  3.5    Chloride 96 - 112 mEq/L 102  102    CO2 19 - 32 mEq/L 22  27    Calcium 8.4 - 10.5 mg/dL 9.7  9.3    Total Protein 6.0 - 8.3 g/dL 6.6  6.7    Total Bilirubin 0.2 - 1.2 mg/dL 0.3  0.3    Alkaline Phos 39 - 117 U/L 28  27    AST 0 - 37 U/L 13  15    ALT 0 - 35 U/L 13  13      Lab Results  Component Value Date   WBC 5.4 04/15/2019   HGB 14.3 04/15/2019   HCT 41.6 04/15/2019   MCV 86.1 04/15/2019   PLT 281 04/15/2019    ASSESSMENT & PLAN:  No problem-specific Assessment & Plan notes found for this encounter.    No orders of the defined types were placed in this encounter.  The patient has a good understanding of the overall plan. she agrees with it. she will call with any problems that may develop before the next visit here. Total time spent: 30 mins including face to face time and time spent for planning, charting and co-ordination of care   Suzzette Righter, [place] 07/20/22    I Gardiner Coins am scribing for Dr. Lindi Adie  ***

## 2022-07-22 ENCOUNTER — Other Ambulatory Visit: Payer: Self-pay

## 2022-07-22 ENCOUNTER — Inpatient Hospital Stay: Payer: 59 | Attending: Hematology and Oncology | Admitting: Hematology and Oncology

## 2022-07-22 ENCOUNTER — Other Ambulatory Visit (HOSPITAL_COMMUNITY): Payer: Self-pay

## 2022-07-22 DIAGNOSIS — N951 Menopausal and female climacteric states: Secondary | ICD-10-CM | POA: Insufficient documentation

## 2022-07-22 DIAGNOSIS — Z79899 Other long term (current) drug therapy: Secondary | ICD-10-CM | POA: Diagnosis not present

## 2022-07-22 DIAGNOSIS — D0511 Intraductal carcinoma in situ of right breast: Secondary | ICD-10-CM | POA: Diagnosis not present

## 2022-07-22 DIAGNOSIS — Z923 Personal history of irradiation: Secondary | ICD-10-CM | POA: Diagnosis not present

## 2022-07-22 DIAGNOSIS — M2560 Stiffness of unspecified joint, not elsewhere classified: Secondary | ICD-10-CM | POA: Diagnosis not present

## 2022-07-22 DIAGNOSIS — Z170 Estrogen receptor positive status [ER+]: Secondary | ICD-10-CM | POA: Diagnosis not present

## 2022-07-22 DIAGNOSIS — C50411 Malignant neoplasm of upper-outer quadrant of right female breast: Secondary | ICD-10-CM | POA: Diagnosis not present

## 2022-07-22 MED ORDER — TAMOXIFEN CITRATE 20 MG PO TABS
20.0000 mg | ORAL_TABLET | Freq: Every day | ORAL | 3 refills | Status: DC
  Filled 2022-07-22 – 2022-08-27 (×2): qty 90, 90d supply, fill #0
  Filled 2022-11-20: qty 90, 90d supply, fill #1
  Filled 2023-02-17: qty 90, 90d supply, fill #2
  Filled 2023-05-18: qty 90, 90d supply, fill #3

## 2022-07-22 NOTE — Assessment & Plan Note (Addendum)
Antiestrogen treatment plan3/31/2020:Diagnostic mammogram detected 2.5cm calcifications in right breast. 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  Treatment plan: 1. Adjuvant radiation therapystarted 05/31/2019 2. Followed by antiestrogen therapy with tamoxifen 5 years, started 08/02/2019  Tamoxifen toxicities:   1.  Mild hot flashes: 2. endometrial hyperplasia: She is going to get a D&C hysteroscopy performed by her gynecologist for further evaluation.   Breast cancer surveillance: 1.  Breast exam 07/23/2031: Benign 2. mammogram 02/25/2021: Benign breast density category B  Weight issues: We discussed different options for weight loss.  She will discuss with her primary care physician about Ozempic or [phone].  Return to clinic in 1 year for follow-up

## 2022-07-23 ENCOUNTER — Telehealth: Payer: Self-pay | Admitting: Hematology and Oncology

## 2022-07-23 NOTE — Telephone Encounter (Signed)
Scheduled appointment per 8/22 los. Left voicemail.

## 2022-07-29 ENCOUNTER — Other Ambulatory Visit (HOSPITAL_COMMUNITY): Payer: Self-pay

## 2022-08-27 ENCOUNTER — Other Ambulatory Visit (HOSPITAL_COMMUNITY): Payer: Self-pay

## 2022-08-27 MED ORDER — DICLOFENAC SODIUM 75 MG PO TBEC
75.0000 mg | DELAYED_RELEASE_TABLET | Freq: Two times a day (BID) | ORAL | 2 refills | Status: DC
  Filled 2022-08-27: qty 60, 30d supply, fill #0
  Filled 2022-09-23: qty 60, 30d supply, fill #1
  Filled 2022-10-22: qty 60, 30d supply, fill #2

## 2022-09-16 ENCOUNTER — Other Ambulatory Visit (HOSPITAL_COMMUNITY): Payer: Self-pay

## 2022-09-23 ENCOUNTER — Other Ambulatory Visit (HOSPITAL_COMMUNITY): Payer: Self-pay

## 2022-09-23 ENCOUNTER — Other Ambulatory Visit: Payer: Self-pay | Admitting: Endocrinology

## 2022-09-24 ENCOUNTER — Other Ambulatory Visit (HOSPITAL_COMMUNITY): Payer: Self-pay

## 2022-09-25 ENCOUNTER — Other Ambulatory Visit (HOSPITAL_COMMUNITY): Payer: Self-pay

## 2022-09-25 MED ORDER — FENOFIBRATE 145 MG PO TABS
145.0000 mg | ORAL_TABLET | Freq: Every day | ORAL | 5 refills | Status: DC
  Filled 2022-09-25: qty 30, 30d supply, fill #0
  Filled 2022-10-22: qty 30, 30d supply, fill #1
  Filled 2022-11-20: qty 30, 30d supply, fill #2
  Filled 2022-12-19: qty 30, 30d supply, fill #3
  Filled 2023-01-20: qty 30, 30d supply, fill #4
  Filled 2023-02-13: qty 30, 30d supply, fill #5

## 2022-10-09 ENCOUNTER — Other Ambulatory Visit (HOSPITAL_COMMUNITY): Payer: Self-pay

## 2022-10-10 DIAGNOSIS — Z3202 Encounter for pregnancy test, result negative: Secondary | ICD-10-CM | POA: Diagnosis not present

## 2022-10-10 DIAGNOSIS — N950 Postmenopausal bleeding: Secondary | ICD-10-CM | POA: Diagnosis not present

## 2022-10-16 ENCOUNTER — Other Ambulatory Visit: Payer: 59

## 2022-10-20 ENCOUNTER — Ambulatory Visit: Payer: 59 | Admitting: Endocrinology

## 2022-10-20 DIAGNOSIS — M79641 Pain in right hand: Secondary | ICD-10-CM | POA: Diagnosis not present

## 2022-10-20 DIAGNOSIS — M0589 Other rheumatoid arthritis with rheumatoid factor of multiple sites: Secondary | ICD-10-CM | POA: Diagnosis not present

## 2022-10-20 DIAGNOSIS — D0511 Intraductal carcinoma in situ of right breast: Secondary | ICD-10-CM | POA: Diagnosis not present

## 2022-10-20 DIAGNOSIS — Z6841 Body Mass Index (BMI) 40.0 and over, adult: Secondary | ICD-10-CM | POA: Diagnosis not present

## 2022-10-20 DIAGNOSIS — Z79899 Other long term (current) drug therapy: Secondary | ICD-10-CM | POA: Diagnosis not present

## 2022-10-20 DIAGNOSIS — M79642 Pain in left hand: Secondary | ICD-10-CM | POA: Diagnosis not present

## 2022-10-20 DIAGNOSIS — M2550 Pain in unspecified joint: Secondary | ICD-10-CM | POA: Diagnosis not present

## 2022-10-22 ENCOUNTER — Other Ambulatory Visit (HOSPITAL_COMMUNITY): Payer: Self-pay

## 2022-10-27 ENCOUNTER — Other Ambulatory Visit (HOSPITAL_COMMUNITY): Payer: Self-pay

## 2022-10-27 ENCOUNTER — Other Ambulatory Visit (INDEPENDENT_AMBULATORY_CARE_PROVIDER_SITE_OTHER): Payer: 59

## 2022-10-27 DIAGNOSIS — E782 Mixed hyperlipidemia: Secondary | ICD-10-CM | POA: Diagnosis not present

## 2022-10-27 DIAGNOSIS — I10 Essential (primary) hypertension: Secondary | ICD-10-CM | POA: Diagnosis not present

## 2022-10-27 LAB — COMPREHENSIVE METABOLIC PANEL
ALT: 12 U/L (ref 0–35)
AST: 14 U/L (ref 0–37)
Albumin: 4.2 g/dL (ref 3.5–5.2)
Alkaline Phosphatase: 31 U/L — ABNORMAL LOW (ref 39–117)
BUN: 14 mg/dL (ref 6–23)
CO2: 26 mEq/L (ref 19–32)
Calcium: 9.3 mg/dL (ref 8.4–10.5)
Chloride: 99 mEq/L (ref 96–112)
Creatinine, Ser: 0.71 mg/dL (ref 0.40–1.20)
GFR: 98.89 mL/min (ref >60.00)
Glucose, Bld: 84 mg/dL (ref 70–99)
Potassium: 3.7 mEq/L (ref 3.5–5.1)
Sodium: 134 mEq/L — ABNORMAL LOW (ref 135–145)
Total Bilirubin: 0.3 mg/dL (ref 0.2–1.2)
Total Protein: 7 g/dL (ref 6.0–8.3)

## 2022-10-27 LAB — LIPID PANEL
Cholesterol: 144 mg/dL (ref 0–200)
HDL: 43.8 mg/dL (ref >39.00)
LDL Cholesterol: 72 mg/dL (ref 0–99)
NonHDL: 100.46
Total CHOL/HDL Ratio: 3
Triglycerides: 142 mg/dL (ref 0.0–149.0)
VLDL: 28.4 mg/dL (ref 0.0–40.0)

## 2022-10-27 LAB — TSH: TSH: 1.65 u[IU]/mL (ref 0.35–5.50)

## 2022-10-27 MED ORDER — HYDROXYCHLOROQUINE SULFATE 200 MG PO TABS
200.0000 mg | ORAL_TABLET | Freq: Two times a day (BID) | ORAL | 5 refills | Status: DC
  Filled 2022-10-27: qty 60, 30d supply, fill #0
  Filled 2022-11-20: qty 60, 30d supply, fill #1
  Filled 2022-12-19: qty 60, 30d supply, fill #2
  Filled 2023-01-20: qty 60, 30d supply, fill #3
  Filled 2023-02-13: qty 60, 30d supply, fill #4
  Filled 2023-03-15: qty 60, 30d supply, fill #5

## 2022-10-30 ENCOUNTER — Ambulatory Visit: Payer: 59 | Admitting: Endocrinology

## 2022-10-31 ENCOUNTER — Ambulatory Visit: Payer: 59 | Admitting: Endocrinology

## 2022-10-31 ENCOUNTER — Encounter: Payer: Self-pay | Admitting: Endocrinology

## 2022-10-31 VITALS — BP 148/78 | HR 74 | Ht 61.5 in | Wt 232.0 lb

## 2022-10-31 DIAGNOSIS — E063 Autoimmune thyroiditis: Secondary | ICD-10-CM | POA: Diagnosis not present

## 2022-10-31 DIAGNOSIS — I10 Essential (primary) hypertension: Secondary | ICD-10-CM | POA: Diagnosis not present

## 2022-10-31 DIAGNOSIS — E782 Mixed hyperlipidemia: Secondary | ICD-10-CM

## 2022-10-31 DIAGNOSIS — R7301 Impaired fasting glucose: Secondary | ICD-10-CM | POA: Diagnosis not present

## 2022-10-31 MED ORDER — LISINOPRIL 20 MG PO TABS: 20.0000 mg | ORAL_TABLET | Freq: Every day | ORAL | 3 refills | Status: DC

## 2022-10-31 NOTE — Patient Instructions (Signed)
Exercise daily

## 2022-10-31 NOTE — Progress Notes (Signed)
Patient ID: Nicole Schwartz, female   DOB: 08-04-1971, 51 y.o.   MRN: [id number]    Chief complaint: Followup of various problems   History of Present Illness:  OBESITY:    In 8/16 she was started on Belviq and with this she had previously lost over 20 pounds with baseline weight of 258 pounds However this was stopped because of lack of insurance coverage  Ozempic was denied by her insurance despite history of impaired fasting glucose  She is taking phentermine since 11/07/2020 Since she felt jittery with this she is taking this every other day   She is also taking 25 mg topiramate every day which helps her better control her appetite  She tries to reduce portions and carbohydrates  She was previously trying to exercise with walking during lunchtime but less with the cold weather May ride her exercise bike at home  She has lost about 2  pounds since her visit in 5/23   Wt Readings from Last 3 Encounters:  10/31/22 232 lb (105.2 kg)  07/22/22 237 lb 11.2 oz (107.8 kg)  04/17/22 234 lb 3.2 oz (106.2 kg)    HYPERTENSION:  She has had hypertension since at least 2004. Has been on ACE inhibitor since about 2005 Blood pressure is previously controlled with lisinopril HCTZ However HCTZ was stopped in 5/23 because of relatively low blood pressure  She has not had recent blood pressure measurements at home or work Does not see a PCP regularly Blood pressure is higher than usual Apparently also blood pressure has been higher with other physicians   BP Readings from Last 3 Encounters:  10/31/22 (!) 148/78  07/22/22 (!) 159/76  04/17/22 108/68      IMPAIRED FASTING GLUCOSE:  Her fasting glucose is consistently below 100 Fasting glucose has been as high as 113  She has been  on metformin ER 750 mg, taking 2 tablets in the evenings   A1c has been in the NORMAL range consistently   Lab Results  Component Value Date   HGBA1C 5.2 04/14/2022   HGBA1C 5.4  10/14/2021   HGBA1C 5.0 12/14/2020   Lab Results  Component Value Date   LDLCALC 72 10/27/2022   CREATININE 0.71 10/27/2022    HYPOTHYROIDISM:  She has had a goiter since at least 1998. Baseline TSH was 5.4 when she was started on levothyroxine in 2000 and has required small doses of levothyroxine No unusual fatigue  Has not required a change in her 75 g dose for the last few years TSH consistently normal   Lab Results  Component Value Date   TSH 1.65 10/27/2022   TSH 1.69 04/14/2022   TSH 1.93 10/14/2021   FREET4 1.09 04/14/2022   FREET4 1.19 10/14/2021   FREET4 1.25 12/14/2020     HYPERLIPIDEMIA: She has had diabetic dyslipidemia with HDL as low as 23 and high triglycerides. Previously on Crestor and fenofibrate combination  She was on Crestor and 2/19 was on 10 mg but subsequently LDL has been below 100 without statin  Triglycerides are consistently normal Taking fenofibrate 145 mg daily  LDL below 100 as before  Lab Results  Component Value Date   CHOL 144 10/27/2022   CHOL 119 04/14/2022   CHOL 146 10/14/2021   Lab Results  Component Value Date   HDL 43.80 10/27/2022   HDL 39.60 04/14/2022   HDL 42.60 10/14/2021   Lab Results  Component Value Date  LDLCALC 72 10/27/2022   LDLCALC 51 04/14/2022   LDLCALC 76 10/14/2021   Lab Results  Component Value Date   TRIG 142.0 10/27/2022   TRIG 144.0 04/14/2022   TRIG 135.0 10/14/2021   Lab Results  Component Value Date   CHOLHDL 3 10/27/2022   CHOLHDL 3 04/14/2022   CHOLHDL 3 10/14/2021   Lab Results  Component Value Date   LDLDIRECT 73.0 04/09/2020   LDLDIRECT 113.0 02/15/2019   LDLDIRECT 122.0 10/18/2018      Allergies as of 10/31/2022   No Known Allergies      Medication List        Accurate as of October 31, 2022  3:52 PM. If you have any questions, ask your nurse or doctor.          diclofenac 75 MG EC tablet Commonly known as: VOLTAREN Take 75 mg by mouth 2 (two) times  daily.   diclofenac 75 MG EC tablet Commonly known as: VOLTAREN Take 1 tablet (75 mg total) by mouth 2 (two) times daily.   fenofibrate 145 MG tablet Commonly known as: TRICOR Take 1 tablet (145 mg total) by mouth daily.   hydroxychloroquine 200 MG tablet Commonly known as: PLAQUENIL TAKE 1 TABLET BY MOUTH TWICE A DAY WITH FOOD OR MILK   hydroxychloroquine 200 MG tablet Commonly known as: PLAQUENIL Take 1 tablet (200 mg total) by mouth 2 (two) times daily with food or milk.   lisinopril 10 MG tablet Commonly known as: ZESTRIL Take 1 tablet (10 mg total) by mouth daily.   metFORMIN 750 MG 24 hr tablet Commonly known as: GLUCOPHAGE-XR Take 2 tablets (1,500 mg total) by mouth daily.   polyethylene glycol-electrolytes 420 g solution Commonly known as: NuLYTELY use as directed   Synthroid 75 MCG tablet Generic drug: levothyroxine Take 1 tablet (75 mcg total) by mouth daily before breakfast.   tamoxifen 20 MG tablet Commonly known as: NOLVADEX Take 1 tablet (20 mg total) by mouth daily.        Allergies: No Known Allergies  Past Medical History:  Diagnosis Date   Arthritis    RA   Breast cancer ([place]) 01/2019   Right Breast Cancer   Family history of breast cancer    Hypertension    Hypothyroidism    Personal history of radiation therapy 2020   Right Breast Cancer    Past Surgical History:  Procedure Laterality Date   BREAST LUMPECTOMY Right 04/20/2019   BREAST LUMPECTOMY WITH RADIOACTIVE SEED LOCALIZATION Right 04/20/2019   Procedure: RIGHT BREAST LUMPECTOMY WITH RADIOACTIVE SEED LOCALIZATION;  Surgeon: Erroll Luna, MD;  Location: [facility];  Service: General;  Laterality: Right;   CESAREAN SECTION     x2    Family History  Problem Relation Age of Onset   Thyroid disease Mother    Diabetes Maternal Grandfather    Hypertension Maternal Grandfather    Diabetes Maternal Grandmother    Breast cancer Paternal Grandmother     Social  History:  reports that she has never smoked. She has never used smokeless tobacco. She reports that she does not currently use alcohol. She reports that she does not use drugs.  Review of Systems   She has been on Plaquenil for rheumatoid arthritis, followed by rheumatologist  History of PCOS: Her menstrual cycles previously had been regular but not she has not had any since 07/2020 She does have some hot flashes but these are possibly from tamoxifen  Free testosterone levels as follows:  Lab Results  Component Value Date   TESTOFREE 3.3 04/14/2022   TESTOFREE 3.7 04/08/2021   TESTOFREE 4.4 (H) 12/14/2020   TESTOFREE 3.3 10/18/2018     Labs:  Lab on 10/27/2022  Component Date Value Ref Range Status   Cholesterol 10/27/2022 144  0 - 200 mg/dL Final   ATP III Classification       Desirable:  < 200 mg/dL               Borderline High:  200 - 239 mg/dL          High:  > = 240 mg/dL   Triglycerides 10/27/2022 142.0  0.0 - 149.0 mg/dL Final   Normal:  <150 mg/dLBorderline High:  150 - 199 mg/dL   HDL 10/27/2022 43.80  >39.00 mg/dL Final   VLDL 10/27/2022 28.4  0.0 - 40.0 mg/dL Final   LDL Cholesterol 10/27/2022 72  0 - 99 mg/dL Final   Total CHOL/HDL Ratio 10/27/2022 3   Final                  Men          Women1/2 Average Risk     3.4          3.3Average Risk          5.0          4.42X Average Risk          9.6          7.13X Average Risk          15.0          11.0                       NonHDL 10/27/2022 100.46   Final   NOTE:  Non-HDL goal should be 30 mg/dL higher than patient's LDL goal (i.e. LDL goal of < 70 mg/dL, would have non-HDL goal of < 100 mg/dL)   TSH 10/27/2022 1.65  0.35 - 5.50 uIU/mL Final   Sodium 10/27/2022 134 (L)  135 - 145 mEq/L Final   Potassium 10/27/2022 3.7  3.5 - 5.1 mEq/L Final   Chloride 10/27/2022 99  96 - 112 mEq/L Final   CO2 10/27/2022 26  19 - 32 mEq/L Final   Glucose, Bld 10/27/2022 84  70 - 99 mg/dL Final   BUN 10/27/2022 14  6 - 23 mg/dL Final    Creatinine, Ser 10/27/2022 0.71  0.40 - 1.20 mg/dL Final   Total Bilirubin 10/27/2022 0.3  0.2 - 1.2 mg/dL Final   Alkaline Phosphatase 10/27/2022 31 (L)  39 - 117 U/L Final   AST 10/27/2022 14  0 - 37 U/L Final   ALT 10/27/2022 12  0 - 35 U/L Final   Total Protein 10/27/2022 7.0  6.0 - 8.3 g/dL Final   Albumin 10/27/2022 4.2  3.5 - 5.2 g/dL Final   GFR 10/27/2022 98.89  >60.00 mL/min Final   Calculated using the CKD-EPI Creatinine Equation (2021)   Calcium 10/27/2022 9.3  8.4 - 10.5 mg/dL Final      EXAM:  BP (!) 148/78   Pulse 74   Ht 5' 1.5" (1.562 m)   Wt 232 lb (105.2 kg)   SpO2 99%   BMI 43.13 kg/m     Assessment/Plan:   OBESITY with BMI over 40:  She has been able to maintain her weight and it is slightly better Currently on phentermine 15 mg every other  day and 25 mg Topamax daily  Likely can improve her level of exercise and consistent diet Not clear if she can benefit from higher doses of Topamax   Hypothyroidism: Mild and again controlled with 75 mcg levothyroxine   LIPIDS: Well controlled, LDL and triglyceride both in the therapeutic range   HYPERTENSION: Blood pressure is higher recently and not clear why Since her sodium is already slightly low at 134 will not go back on HCTZ but increase lisinopril She will need to follow-up with her PCP if blood pressure does not come down   PCOS: On metformin and this is now benefiting her insulin sensitivity and glucose     There are no Patient Instructions on file for this visit.     Elayne Snare 10/31/2022, 3:52 PM

## 2022-11-20 ENCOUNTER — Other Ambulatory Visit: Payer: Self-pay | Admitting: Endocrinology

## 2022-11-20 ENCOUNTER — Other Ambulatory Visit (HOSPITAL_COMMUNITY): Payer: Self-pay

## 2022-11-20 ENCOUNTER — Other Ambulatory Visit: Payer: Self-pay

## 2022-11-20 MED ORDER — METFORMIN HCL ER 750 MG PO TB24
1500.0000 mg | ORAL_TABLET | Freq: Every day | ORAL | 1 refills | Status: DC
  Filled 2022-11-20: qty 180, 90d supply, fill #0
  Filled 2023-02-17: qty 180, 90d supply, fill #1

## 2022-11-20 MED ORDER — DICLOFENAC SODIUM 75 MG PO TBEC
75.0000 mg | DELAYED_RELEASE_TABLET | Freq: Two times a day (BID) | ORAL | 2 refills | Status: DC
  Filled 2022-11-20: qty 60, 30d supply, fill #0
  Filled 2022-12-19: qty 60, 30d supply, fill #1
  Filled 2023-01-20: qty 60, 30d supply, fill #2

## 2022-12-03 ENCOUNTER — Encounter: Payer: Self-pay | Admitting: Endocrinology

## 2022-12-03 ENCOUNTER — Other Ambulatory Visit (HOSPITAL_COMMUNITY): Payer: Self-pay

## 2022-12-03 DIAGNOSIS — E063 Autoimmune thyroiditis: Secondary | ICD-10-CM

## 2022-12-03 MED ORDER — LEVOTHYROXINE SODIUM 75 MCG PO TABS
75.0000 ug | ORAL_TABLET | Freq: Every day | ORAL | 1 refills | Status: DC
  Filled 2022-12-03 – 2022-12-04 (×2): qty 90, 90d supply, fill #0
  Filled 2023-03-02: qty 90, 90d supply, fill #1

## 2022-12-04 ENCOUNTER — Other Ambulatory Visit: Payer: Self-pay

## 2022-12-04 ENCOUNTER — Other Ambulatory Visit (HOSPITAL_COMMUNITY): Payer: Self-pay

## 2022-12-04 ENCOUNTER — Encounter: Payer: Self-pay | Admitting: Endocrinology

## 2022-12-04 DIAGNOSIS — I10 Essential (primary) hypertension: Secondary | ICD-10-CM

## 2022-12-04 MED ORDER — LISINOPRIL 20 MG PO TABS
20.0000 mg | ORAL_TABLET | Freq: Every day | ORAL | 3 refills | Status: DC
  Filled 2022-12-04: qty 90, 90d supply, fill #0
  Filled 2023-02-25: qty 90, 90d supply, fill #1

## 2022-12-08 ENCOUNTER — Other Ambulatory Visit (HOSPITAL_COMMUNITY): Payer: Self-pay

## 2023-01-14 ENCOUNTER — Ambulatory Visit (INDEPENDENT_AMBULATORY_CARE_PROVIDER_SITE_OTHER): Payer: Self-pay | Admitting: Ophthalmology

## 2023-01-14 DIAGNOSIS — H25813 Combined forms of age-related cataract, bilateral: Secondary | ICD-10-CM

## 2023-01-14 DIAGNOSIS — M0520 Rheumatoid vasculitis with rheumatoid arthritis of unspecified site: Secondary | ICD-10-CM

## 2023-01-14 DIAGNOSIS — I10 Essential (primary) hypertension: Secondary | ICD-10-CM

## 2023-01-14 DIAGNOSIS — H35033 Hypertensive retinopathy, bilateral: Secondary | ICD-10-CM

## 2023-01-14 DIAGNOSIS — D3132 Benign neoplasm of left choroid: Secondary | ICD-10-CM

## 2023-01-14 DIAGNOSIS — Z79899 Other long term (current) drug therapy: Secondary | ICD-10-CM

## 2023-01-14 NOTE — Progress Notes (Signed)
[facility] Note  01/14/2023     CHIEF COMPLAINT Patient presents for Retina Evaluation   HISTORY OF PRESENT ILLNESS: Nicole Schwartz is a 52 y.o. female who presents to the clinic today for:   HPI     Retina Evaluation   In both eyes.  I, the attending physician,  performed the HPI with the patient and updated documentation appropriately.        Comments   Plaquenil exam      Last edited by Bernarda Caffey, MD on 01/16/2023 12:58 AM.    Pt is here for Plaquenil exam, she has been on it for a little over 5 years for RA, she takes 42m daily, she also takes diclofenac   Referring physician: HGavin Pound MD [address]  [place] [id number] HISTORICAL INFORMATION:   Selected notes from the [facility]ardReferred by Dr. HTrudie Reedfor Plaquenil Eye Exam LEE:  Ocular Hx- PMH-    CURRENT MEDICATIONS: No current outpatient medications on file. (Ophthalmic Drugs)   No current facility-administered medications for this visit. (Ophthalmic Drugs)   Current Outpatient Medications (Other)  Medication Sig   diclofenac (VOLTAREN) 75 MG EC tablet Take 75 mg by mouth 2 (two) times daily.   diclofenac (VOLTAREN) 75 MG EC tablet Take 1 tablet (75 mg total) by mouth 2 (two) times daily.   fenofibrate (TRICOR) 145 MG tablet Take 1 tablet (145 mg total) by mouth daily.   hydroxychloroquine (PLAQUENIL) 200 MG tablet Take 1 tablet (200 mg total) by mouth 2 (two) times daily with food or milk.   levothyroxine (SYNTHROID) 75 MCG tablet Take 1 tablet (75 mcg total) by mouth daily before breakfast.   lisinopril (ZESTRIL) 20 MG tablet Take 1 tablet (20 mg total) by mouth daily.   metFORMIN (GLUCOPHAGE-XR) 750 MG 24 hr tablet Take 2 tablets (1,500 mg total) by mouth daily.   polyethylene glycol-electrolytes (NULYTELY) 420 g solution use as directed (Patient not taking: Reported on 10/31/2022)   tamoxifen (NOLVADEX) 20 MG tablet  Take 1 tablet (20 mg total) by mouth daily.   No current facility-administered medications for this visit. (Other)   REVIEW OF SYSTEMS: ROS   Positive for: Musculoskeletal, Eyes Last edited by CTheodore Demark COA on 01/14/2023  3:00 PM.     ALLERGIES No Known Allergies  PAST MEDICAL HISTORY Past Medical History:  Diagnosis Date   Arthritis    RA   [place]   Right Breast Cancer   Family history of breast cancer    Hypertension    Hypothyroidism    Personal history of radiation therapy 2020   Right Breast Cancer   Past Surgical History:  Procedure Laterality Date   BREAST LUMPECTOMY Right 04/20/2019   BREAST LUMPECTOMY WITH RADIOACTIVE SEED LOCALIZATION Right 04/20/2019   Procedure: RIGHT BREAST LUMPECTOMY WITH RADIOACTIVE SEED LOCALIZATION;  Surgeon: CErroll Luna MD;  [place] [facility]  Service: General;  Laterality: Right;   CESAREAN SECTION     x2    FAMILY HISTORY Family History  Problem Relation Age of Onset   Thyroid disease Mother    Diabetes Maternal Grandfather    Hypertension Maternal Grandfather    Diabetes Maternal Grandmother    Breast cancer Paternal Grandmother     SOCIAL HISTORY Social History   Tobacco Use   Smoking status: Never   Smokeless tobacco: Never  Vaping Use   Vaping Use:  Never used  Substance Use Topics   Alcohol use: Not Currently   Drug use: Never       OPHTHALMIC EXAM:  Base Eye Exam     Visual Acuity (Snellen - Linear)       Right Left   Dist cc 20/20 -1 20/20    Correction: Glasses         Tonometry (Tonopen, 2:57 PM)       Right Left   Pressure 19 19         Pupils       Dark Light Shape React APD   Right 4 3 Round Brisk None   Left 4 3 Round Brisk None         Visual Fields (Counting fingers)       Left Right    Full Full         Extraocular Movement       Right Left    Full, Ortho Full, Ortho         Neuro/Psych     Oriented x3: Yes    Mood/Affect: Normal         Dilation     Both eyes: 1.0% Mydriacyl, 2.5% Phenylephrine @ 2:57 PM           Slit Lamp and Fundus Exam     Slit Lamp Exam       Right Left   Lids/Lashes Dermatochalasis - upper lid Dermatochalasis - upper lid   Conjunctiva/Sclera White and quiet White and quiet   Cornea Clear Clear   Anterior Chamber deep, 2-3+ fine pigment deep, 0.5+ fine cell / pigment   Iris Round and dilated Round and dilated   Lens 2+ Nuclear sclerosis, 2+ Cortical cataract 2+ Nuclear sclerosis, 2+ Cortical cataract   Anterior Vitreous Vitreous syneresis Vitreous syneresis         Fundus Exam       Right Left   Disc Pink and Sharp, temporal PPA Pink and Sharp, temporal PPA   C/D Ratio 0.1 0.1   Macula Flat, Good foveal reflex, No heme or edema, no atrophy Flat, Good foveal reflex, No heme or edema, no atrophy   Vessels mild attenuation, mild AV crossing changes mild attenuation, mild AV crossing changes   Periphery Attached Attached           Refraction     Wearing Rx       Sphere Cylinder Axis Add   Right +0.00 +0.50 159 +1.25   Left -1.00 +0.50 171 +1.25           IMAGING AND PROCEDURES  Imaging and Procedures for 01/14/2023  OCT, Retina - OU - Both Eyes       Right Eye Quality was good. Central Foveal Thickness: 266. Progression has no prior data. Findings include normal foveal contour, no IRF, no SRF (No ellipsoid thinning or loss).   Left Eye Quality was good. Central Foveal Thickness: 262. Progression has no prior data. Findings include normal foveal contour, no IRF, no SRF (No ellipsoid thinning or loss, flat hyper reflective choroidal lesion IT mac).   Notes *Images captured and stored on drive  Diagnosis / Impression:  NFP; no IRF/SRF OU OD: No ellipsoid thinning or loss OS: No ellipsoid thinning or loss, flat hyper reflective choroidal lesion IT mac  Clinical management:  See below  Abbreviations: NFP - Normal foveal  profile. CME - cystoid macular edema. PED - pigment epithelial detachment. IRF - intraretinal fluid. SRF - subretinal  fluid. EZ - ellipsoid zone. ERM - epiretinal membrane. ORA - outer retinal atrophy. ORT - outer retinal tubulation. SRHM - subretinal hyper-reflective material. IRHM - intraretinal hyper-reflective material            ASSESSMENT/PLAN:    ICD-10-CM   1. 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 Essential hypertension  I10     5. Hypertensive retinopathy of both eyes  H35.033 OCT, Retina - OU - Both Eyes    6. Combined forms of age-related cataract of both eyes  H25.813       1,2. Plaquenil (hydroxychloroquine [HCQ]) use for RA  - pt taking 400 mg daily for ~5 years  - under the expert management of Dr. Trudie Reed  - BCVA 20/20 OU, asymptomatic - no retinal toxicity noted on exam or OCT today - Pt wt in Epic is ~105 kg - 473m/105kg = 3.80 mg/kg/day - the AAO recommends daily dosing of < 5.0 mg/kg for HCQ -- under target limit - will send notes and letter to Dr. HTrudie Reed- f/u in 1 year -- DFE/OCT  3. Choroidal Nevus, OS  - long-standing and monitored by primary eye care provider  - flat, pigmented lesion in inferotemporal macula, ~0.7 DD  - no visual symptoms, SRF or orange pigment  - no drusen  - thickness < 232m - discussed findings, prognosis  - recommend monitoring  - f/u in 1 year  4,5. Hypertensive retinopathy OU - discussed importance of tight BP control - monitor  6. Mixed Cataract OU - The symptoms of cataract, surgical options, and treatments and risks were discussed with patient. - discussed diagnosis and progression - not yet visually significant - monitor for now  Ophthalmic Meds Ordered this visit:  No orders of the defined types were placed in this encounter.    Return in about 1 year (around 01/15/2024) for f/u Plaquenil exam, DFE, OCT.  There  are no Patient Instructions on file for this visit.   Explained the diagnoses, plan, and follow up with the patient and they expressed understanding.  Patient expressed understanding of the importance of proper follow up care.   This document serves as a record of services personally performed by BrGardiner SleeperMD, PhD. It was created on their behalf by AmSan JettyBrOwens SharkOA an ophthalmic technician. The creation of this record is the provider's dictation and/or activities during the visit.    Electronically signed by: AmSan JettyBrOwens SharkOANew York2.14.2024 1:08 AM   BrGardiner SleeperM.D., Ph.D. Diseases & Surgery of the Retina and Vitreous [facility] SettlementI have reviewed the above documentation for accuracy and completeness, and I agree with the above. BrGardiner SleeperM.D., Ph.D. 01/16/23 1:10 AM   Abbreviations: M myopia (nearsighted); A astigmatism; H hyperopia (farsighted); P presbyopia; Mrx spectacle prescription;  CTL contact lenses; OD right eye; OS left eye; OU both eyes  XT exotropia; ET esotropia; PEK punctate epithelial keratitis; PEE punctate epithelial erosions; DES dry eye syndrome; MGD meibomian gland dysfunction; ATs artificial tears; PFAT's preservative free artificial tears; NSEutawvilleuclear sclerotic cataract; PSC posterior subcapsular cataract; ERM epi-retinal membrane; PVD posterior vitreous detachment; RD retinal detachment; DM diabetes mellitus; DR diabetic retinopathy; NPDR non-proliferative diabetic retinopathy; PDR proliferative diabetic retinopathy; CSME clinically significant macular edema; DME diabetic macular edema; dbh dot blot hemorrhages;  CWS cotton wool spot; POAG primary open angle glaucoma; C/D cup-to-disc ratio; HVF humphrey visual field; GVF goldmann visual field; OCT optical coherence tomography; IOP intraocular pressure; BRVO Branch retinal vein occlusion; CRVO central retinal vein occlusion; CRAO central retinal artery occlusion; BRAO branch retinal  artery occlusion; RT retinal tear; SB scleral buckle; PPV pars plana vitrectomy; VH Vitreous hemorrhage; PRP panretinal laser photocoagulation; IVK intravitreal kenalog; VMT vitreomacular traction; MH Macular hole;  NVD neovascularization of the disc; NVE neovascularization elsewhere; AREDS age related eye disease study; ARMD age related macular degeneration; POAG primary open angle glaucoma; EBMD epithelial/anterior basement membrane dystrophy; ACIOL anterior chamber intraocular lens; IOL intraocular lens; PCIOL posterior chamber intraocular lens; Phaco/IOL phacoemulsification with intraocular lens placement; [place] photorefractive keratectomy; LASIK laser assisted in situ keratomileusis; HTN hypertension; DM diabetes mellitus; COPD chronic obstructive pulmonary disease

## 2023-01-16 ENCOUNTER — Encounter (INDEPENDENT_AMBULATORY_CARE_PROVIDER_SITE_OTHER): Payer: Self-pay | Admitting: Ophthalmology

## 2023-02-13 ENCOUNTER — Other Ambulatory Visit (HOSPITAL_COMMUNITY): Payer: Self-pay

## 2023-02-13 ENCOUNTER — Other Ambulatory Visit: Payer: Self-pay

## 2023-02-13 MED ORDER — DICLOFENAC SODIUM 75 MG PO TBEC
75.0000 mg | DELAYED_RELEASE_TABLET | Freq: Two times a day (BID) | ORAL | 2 refills | Status: DC
  Filled 2023-02-13: qty 60, 30d supply, fill #0
  Filled 2023-03-15: qty 60, 30d supply, fill #1
  Filled 2023-04-14: qty 60, 30d supply, fill #2

## 2023-02-17 ENCOUNTER — Other Ambulatory Visit: Payer: Self-pay

## 2023-03-15 ENCOUNTER — Other Ambulatory Visit: Payer: Self-pay | Admitting: Endocrinology

## 2023-03-16 ENCOUNTER — Other Ambulatory Visit: Payer: Self-pay

## 2023-03-16 ENCOUNTER — Other Ambulatory Visit (HOSPITAL_COMMUNITY): Payer: Self-pay

## 2023-03-16 MED ORDER — FENOFIBRATE 145 MG PO TABS
145.0000 mg | ORAL_TABLET | Freq: Every day | ORAL | 5 refills | Status: DC
  Filled 2023-03-16: qty 30, 30d supply, fill #0
  Filled 2023-04-14: qty 30, 30d supply, fill #1
  Filled 2023-05-11: qty 30, 30d supply, fill #2
  Filled 2023-06-08 – 2023-06-10 (×2): qty 30, 30d supply, fill #3
  Filled 2023-07-10: qty 30, 30d supply, fill #4
  Filled 2023-08-10: qty 30, 30d supply, fill #5

## 2023-04-14 ENCOUNTER — Other Ambulatory Visit: Payer: Self-pay

## 2023-04-14 ENCOUNTER — Other Ambulatory Visit (HOSPITAL_COMMUNITY): Payer: Self-pay

## 2023-04-14 MED ORDER — HYDROXYCHLOROQUINE SULFATE 200 MG PO TABS
200.0000 mg | ORAL_TABLET | Freq: Two times a day (BID) | ORAL | 0 refills | Status: DC
  Filled 2023-04-14: qty 60, 30d supply, fill #0

## 2023-04-23 ENCOUNTER — Other Ambulatory Visit (HOSPITAL_COMMUNITY): Payer: Self-pay

## 2023-04-23 DIAGNOSIS — Z6841 Body Mass Index (BMI) 40.0 and over, adult: Secondary | ICD-10-CM | POA: Diagnosis not present

## 2023-04-23 DIAGNOSIS — D0511 Intraductal carcinoma in situ of right breast: Secondary | ICD-10-CM | POA: Diagnosis not present

## 2023-04-23 DIAGNOSIS — Z79899 Other long term (current) drug therapy: Secondary | ICD-10-CM | POA: Diagnosis not present

## 2023-04-23 DIAGNOSIS — M79641 Pain in right hand: Secondary | ICD-10-CM | POA: Diagnosis not present

## 2023-04-23 DIAGNOSIS — M0589 Other rheumatoid arthritis with rheumatoid factor of multiple sites: Secondary | ICD-10-CM | POA: Diagnosis not present

## 2023-04-23 DIAGNOSIS — M79642 Pain in left hand: Secondary | ICD-10-CM | POA: Diagnosis not present

## 2023-04-23 MED ORDER — DICLOFENAC SODIUM 75 MG PO TBEC
75.0000 mg | DELAYED_RELEASE_TABLET | Freq: Two times a day (BID) | ORAL | 1 refills | Status: DC
  Filled 2023-04-23 – 2023-05-11 (×2): qty 180, 90d supply, fill #0
  Filled 2023-08-10: qty 180, 90d supply, fill #1

## 2023-04-23 MED ORDER — HYDROXYCHLOROQUINE SULFATE 200 MG PO TABS
200.0000 mg | ORAL_TABLET | Freq: Two times a day (BID) | ORAL | 1 refills | Status: DC
  Filled 2023-04-23 – 2023-05-11 (×2): qty 180, 90d supply, fill #0
  Filled 2023-08-10: qty 180, 90d supply, fill #1

## 2023-04-29 ENCOUNTER — Other Ambulatory Visit (INDEPENDENT_AMBULATORY_CARE_PROVIDER_SITE_OTHER): Payer: Commercial Managed Care - PPO

## 2023-04-29 DIAGNOSIS — E782 Mixed hyperlipidemia: Secondary | ICD-10-CM

## 2023-04-29 DIAGNOSIS — R7301 Impaired fasting glucose: Secondary | ICD-10-CM | POA: Diagnosis not present

## 2023-04-29 DIAGNOSIS — E063 Autoimmune thyroiditis: Secondary | ICD-10-CM

## 2023-04-29 DIAGNOSIS — I10 Essential (primary) hypertension: Secondary | ICD-10-CM | POA: Diagnosis not present

## 2023-04-29 LAB — HEMOGLOBIN A1C: Hgb A1c MFr Bld: 5.2 % (ref 4.6–6.5)

## 2023-04-29 LAB — LIPID PANEL
Cholesterol: 144 mg/dL (ref 0–200)
HDL: 41.6 mg/dL (ref >39.00)
LDL Cholesterol: 73 mg/dL (ref 0–99)
NonHDL: 101.96
Total CHOL/HDL Ratio: 3
Triglycerides: 146 mg/dL (ref 0.0–149.0)
VLDL: 29.2 mg/dL (ref 0.0–40.0)

## 2023-04-29 LAB — COMPREHENSIVE METABOLIC PANEL
ALT: 14 U/L (ref 0–35)
AST: 15 U/L (ref 0–37)
Albumin: 3.9 g/dL (ref 3.5–5.2)
Alkaline Phosphatase: 31 U/L — ABNORMAL LOW (ref 39–117)
BUN: 18 mg/dL (ref 6–23)
CO2: 28 mEq/L (ref 19–32)
Calcium: 9.2 mg/dL (ref 8.4–10.5)
Chloride: 104 mEq/L (ref 96–112)
Creatinine, Ser: 0.71 mg/dL (ref 0.40–1.20)
GFR: 98.54 mL/min (ref >60.00)
Glucose, Bld: 85 mg/dL (ref 70–99)
Potassium: 3.9 mEq/L (ref 3.5–5.1)
Sodium: 139 mEq/L (ref 135–145)
Total Bilirubin: 0.3 mg/dL (ref 0.2–1.2)
Total Protein: 6.8 g/dL (ref 6.0–8.3)

## 2023-04-29 LAB — T4, FREE: Free T4: 1.01 ng/dL (ref 0.60–1.60)

## 2023-04-29 LAB — TSH: TSH: 1.39 u[IU]/mL (ref 0.35–5.50)

## 2023-05-04 ENCOUNTER — Encounter: Payer: Self-pay | Admitting: Endocrinology

## 2023-05-04 ENCOUNTER — Other Ambulatory Visit (HOSPITAL_COMMUNITY): Payer: Self-pay

## 2023-05-04 ENCOUNTER — Ambulatory Visit: Payer: Commercial Managed Care - PPO | Admitting: Endocrinology

## 2023-05-04 VITALS — BP 136/80 | HR 80 | Ht 61.5 in | Wt 231.4 lb

## 2023-05-04 DIAGNOSIS — E063 Autoimmune thyroiditis: Secondary | ICD-10-CM | POA: Diagnosis not present

## 2023-05-04 DIAGNOSIS — R7301 Impaired fasting glucose: Secondary | ICD-10-CM | POA: Diagnosis not present

## 2023-05-04 DIAGNOSIS — I10 Essential (primary) hypertension: Secondary | ICD-10-CM

## 2023-05-04 DIAGNOSIS — E782 Mixed hyperlipidemia: Secondary | ICD-10-CM | POA: Diagnosis not present

## 2023-05-04 MED ORDER — LISINOPRIL-HYDROCHLOROTHIAZIDE 10-12.5 MG PO TABS
1.0000 | ORAL_TABLET | Freq: Every day | ORAL | 3 refills | Status: DC
  Filled 2023-05-04: qty 90, 90d supply, fill #0
  Filled 2023-07-27: qty 90, 90d supply, fill #1
  Filled 2023-10-28: qty 90, 90d supply, fill #2
  Filled 2024-01-27: qty 90, 90d supply, fill #3

## 2023-05-04 MED ORDER — TOPIRAMATE 25 MG PO TABS
25.0000 mg | ORAL_TABLET | Freq: Two times a day (BID) | ORAL | 3 refills | Status: DC
  Filled 2023-05-04: qty 60, 30d supply, fill #0
  Filled 2023-05-30: qty 60, 30d supply, fill #1
  Filled 2023-07-10: qty 60, 30d supply, fill #2

## 2023-05-04 NOTE — Patient Instructions (Signed)
Topamax 1x daily for 2 weeks then 2x daily if tolerated

## 2023-05-04 NOTE — Progress Notes (Unsigned)
Patient ID: Nicole Schwartz, female   DOB: 10/25/1971, 52 y.o.   MRN: [id number]    Chief complaint: Followup of various problems   History of Present Illness:  OBESITY:    In 07/2015 she was started on Belviq and with this she had previously lost over 20 pounds with baseline weight of 258 pounds However this was stopped because of lack of insurance coverage  Ozempic was denied by her insurance despite history of impaired fasting glucose  She was taking phentermine since 11/07/2020 Since she felt jittery with this she was then taking this every other day  However appears that she did not have a refill and did not request one in late 2023  She was also taking 25 mg topiramate every day which helps her better control her appetite; again for some reason she did not request a refill and has not taken for several months  However she is still able to reduce portions and carbohydrates and maintain her weight  She has been walking during lunchtime recently With this her it is about the same  Wt Readings from Last 3 Encounters:  05/04/23 231 lb 6.4 oz (105 kg)  10/31/22 232 lb (105.2 kg)  07/22/22 237 lb 11.2 oz (107.8 kg)    HYPERTENSION:  She has had hypertension since at least 2004. Has been on ACE inhibitor since about 2005 Blood pressure is previously controlled with lisinopril HCTZ However HCTZ was stopped in 5/23 because of relatively low blood pressure  Lisinopril was increased to 20 mg in 12/23 She does check her blood pressure at work or at home and it is usually 130-140 systolic even recently and up to 150 with her rheumatologist  Does not see a PCP regularly    BP Readings from Last 3 Encounters:  05/04/23 136/80  10/31/22 (!) 148/78  07/22/22 (!) 159/76   IMPAIRED FASTING GLUCOSE:  Her fasting glucose is consistently below 100 Fasting glucose has been as high as 113  She has been  on metformin ER 750 mg, taking 2 tablets in the evenings    A1c has been in the NORMAL range consistently   Lab Results  Component Value Date   HGBA1C 5.2 04/29/2023   HGBA1C 5.2 04/14/2022   HGBA1C 5.4 10/14/2021   Lab Results  Component Value Date   LDLCALC 73 04/29/2023   CREATININE 0.71 04/29/2023    HYPOTHYROIDISM:  She has had a goiter since at least 1998. Baseline TSH was 5.4 when she was started on levothyroxine in 2000 and has required small doses of levothyroxine No unusual fatigue  Has not required a change in her 75 g dose for the last few years TSH consistently normal   Lab Results  Component Value Date   TSH 1.39 04/29/2023   TSH 1.65 10/27/2022   TSH 1.69 04/14/2022   FREET4 1.01 04/29/2023   FREET4 1.09 04/14/2022   FREET4 1.19 10/14/2021     HYPERLIPIDEMIA: She has had diabetic dyslipidemia with HDL as low as 23 and high triglycerides. Previously on Crestor and fenofibrate combination  She was on Crestor and 2/19 was on 10 mg but subsequently LDL has been below 100 without statin  Triglycerides are consistently normal Taking fenofibrate 145 mg daily  LDL below 100 as before  Lab Results  Component Value Date   CHOL 144 04/29/2023   CHOL 144 10/27/2022   CHOL 119 04/14/2022   Lab Results  Component Value  Date   HDL 41.60 04/29/2023   HDL 43.80 10/27/2022   HDL 39.60 04/14/2022   Lab Results  Component Value Date   LDLCALC 73 04/29/2023   LDLCALC 72 10/27/2022   LDLCALC 51 04/14/2022   Lab Results  Component Value Date   TRIG 146.0 04/29/2023   TRIG 142.0 10/27/2022   TRIG 144.0 04/14/2022   Lab Results  Component Value Date   CHOLHDL 3 04/29/2023   CHOLHDL 3 10/27/2022   CHOLHDL 3 04/14/2022   Lab Results  Component Value Date   LDLDIRECT 73.0 04/09/2020   LDLDIRECT 113.0 02/15/2019   LDLDIRECT 122.0 10/18/2018    Allergies as of 05/04/2023   No Known Allergies      Medication List        Accurate as of May 04, 2023 11:59 PM. If you have any questions, ask your nurse or  doctor.          STOP taking these medications    lisinopril 20 MG tablet Commonly known as: ZESTRIL Stopped by: Reather Littler, MD   polyethylene glycol-electrolytes 420 g solution Commonly known as: NuLYTELY Stopped by: Reather Littler, MD       TAKE these medications    diclofenac 75 MG EC tablet Commonly known as: VOLTAREN Take 1 tablet (75 mg total) by mouth 2 (two) times daily. What changed: Another medication with the same name was removed. Continue taking this medication, and follow the directions you see here. Changed by: Reather Littler, MD   fenofibrate 145 MG tablet Commonly known as: TRICOR Take 1 tablet (145 mg total) by mouth daily.   hydroxychloroquine 200 MG tablet Commonly known as: Plaquenil Take 1 tablet (200 mg total) by mouth 2 (two) times daily with food or milk   levothyroxine 75 MCG tablet Commonly known as: Synthroid Take 1 tablet (75 mcg total) by mouth daily before breakfast.   lisinopril-hydrochlorothiazide 10-12.5 MG tablet Commonly known as: ZESTORETIC Take 1 tablet by mouth daily. Started by: Reather Littler, MD   metFORMIN 750 MG 24 hr tablet Commonly known as: GLUCOPHAGE-XR Take 2 tablets (1,500 mg total) by mouth daily.   tamoxifen 20 MG tablet Commonly known as: NOLVADEX Take 1 tablet (20 mg total) by mouth daily.   topiramate 25 MG tablet Commonly known as: Topamax Take 1 tablet (25 mg total) by mouth 2 (two) times daily. Started by: Reather Littler, MD        Allergies: No Known Allergies  Past Medical History:  Diagnosis Date   Arthritis    RA   Breast cancer (HCC) 01/2019   Right Breast Cancer   Family history of breast cancer    Hypertension    Hypothyroidism    Personal history of radiation therapy 2020   Right Breast Cancer    Past Surgical History:  Procedure Laterality Date   BREAST LUMPECTOMY Right 04/20/2019   BREAST LUMPECTOMY WITH RADIOACTIVE SEED LOCALIZATION Right 04/20/2019   Procedure: RIGHT BREAST LUMPECTOMY  WITH RADIOACTIVE SEED LOCALIZATION;  Surgeon: Harriette Bouillon, MD;  Location: [facility];  Service: General;  Laterality: Right;   CESAREAN SECTION     x2    Family History  Problem Relation Age of Onset   Thyroid disease Mother    Diabetes Maternal Grandfather    Hypertension Maternal Grandfather    Diabetes Maternal Grandmother    Breast cancer Paternal Grandmother     Social History:  reports that she has never smoked. She has never used smokeless tobacco. She  reports that she does not currently use alcohol. She reports that she does not use drugs.  Review of Systems   She has been on Plaquenil for rheumatoid arthritis, followed by rheumatologist  History of PCOS: Her menstrual cycles previously had been regular but not she has not had any since 07/2020 She does have some hot flashes but these are possibly from tamoxifen  Free testosterone levels as follows:  Lab Results  Component Value Date   TESTOFREE 3.3 04/14/2022   TESTOFREE 3.7 04/08/2021   TESTOFREE 4.4 (H) 12/14/2020   TESTOFREE 3.3 10/18/2018     Labs:  Lab on 04/29/2023  Component Date Value Ref Range Status   Free T4 04/29/2023 1.01  0.60 - 1.60 ng/dL Final   Comment: Specimens from patients who are undergoing biotin therapy and /or ingesting biotin supplements may contain high levels of biotin.  The higher biotin concentration in these specimens interferes with this Free T4 assay.  Specimens that contain high levels  of biotin may cause false high results for this Free T4 assay.  Please interpret results in light of the total clinical presentation of the patient.     TSH 04/29/2023 1.39  0.35 - 5.50 uIU/mL Final   Cholesterol 04/29/2023 144  0 - 200 mg/dL Final   ATP III Classification       Desirable:  < 200 mg/dL               Borderline High:  200 - 239 mg/dL          High:  > = 409 mg/dL   Triglycerides 81/19/1478 146.0  0.0 - 149.0 mg/dL Final   Normal:  <295 mg/dLBorderline High:  150  - 199 mg/dL   HDL 62/13/0865 78.46  >39.00 mg/dL Final   VLDL 96/29/5284 29.2  0.0 - 40.0 mg/dL Final   LDL Cholesterol 04/29/2023 73  0 - 99 mg/dL Final   Total CHOL/HDL Ratio 04/29/2023 3   Final                  Men          Women1/2 Average Risk     3.4          3.3Average Risk          5.0          4.42X Average Risk          9.6          7.13X Average Risk          15.0          11.0                       NonHDL 04/29/2023 101.96   Final   NOTE:  Non-HDL goal should be 30 mg/dL higher than patient's LDL goal (i.e. LDL goal of < 70 mg/dL, would have non-HDL goal of < 100 mg/dL)   Sodium 13/24/4010 272  135 - 145 mEq/L Final   Potassium 04/29/2023 3.9  3.5 - 5.1 mEq/L Final   Chloride 04/29/2023 104  96 - 112 mEq/L Final   CO2 04/29/2023 28  19 - 32 mEq/L Final   Glucose, Bld 04/29/2023 85  70 - 99 mg/dL Final   BUN 53/66/4403 18  6 - 23 mg/dL Final   Creatinine, Ser 04/29/2023 0.71  0.40 - 1.20 mg/dL Final   Total Bilirubin 04/29/2023 0.3  0.2 - 1.2 mg/dL Final   Alkaline  Phosphatase 04/29/2023 31 (L)  39 - 117 U/L Final   AST 04/29/2023 15  0 - 37 U/L Final   ALT 04/29/2023 14  0 - 35 U/L Final   Total Protein 04/29/2023 6.8  6.0 - 8.3 g/dL Final   Albumin 24/40/1027 3.9  3.5 - 5.2 g/dL Final   GFR 25/36/6440 98.54  >60.00 mL/min Final   Calculated using the CKD-EPI Creatinine Equation (2021)   Calcium 04/29/2023 9.2  8.4 - 10.5 mg/dL Final   Hgb H4V MFr Bld 04/29/2023 5.2  4.6 - 6.5 % Final   Glycemic Control Guidelines for People with Diabetes:Non Diabetic:  <6%Goal of Therapy: <7%Additional Action Suggested:  >8%       EXAM:  BP 136/80   Pulse 80   Ht 5' 1.5" (1.562 m)   Wt 231 lb 6.4 oz (105 kg)   SpO2 97%   BMI 43.01 kg/m    Thyroid rubbery and enlarged about twice normal No skin changes   Assessment/Plan:   OBESITY with BMI over 40:  She has been able to maintain her weight  Previously on phentermine 15 mg every other day and 25 mg Topamax daily   As  above she ran out of her prescriptions and has not taken any Since she has some tendency to higher blood pressure now we will only try her on Topamax starting with 25 mg daily and then go up to twice a day if tolerated   Hypothyroidism: Mild and consistently controlled with 75 mcg levothyroxine which she takes regularly   LIPIDS: Well controlled, LDL and triglyceride both in the therapeutic range with fenofibrate   HYPERTENSION: Blood pressure is higher more persistently with lisinopril alone  With 20 mg dose her blood pressure is slightly better but still not optimal Sodium is improved and now we can try her on HCTZ New prescription will be given for lisinopril HCT 10/12.5 mg daily She will make a appointment with her PCP for follow-up   PCOS: On metformin and this is benefiting her insulin sensitivity and glucose Also doing better with maintaining her weight loss  Follow-up in 6 months  Patient Instructions  Topamax 1x daily for 2 weeks then 2x daily if tolerated     Reather Littler 05/05/2023, 11:37 AM

## 2023-05-11 ENCOUNTER — Other Ambulatory Visit (HOSPITAL_COMMUNITY): Payer: Self-pay

## 2023-05-11 ENCOUNTER — Other Ambulatory Visit: Payer: Self-pay

## 2023-05-18 ENCOUNTER — Other Ambulatory Visit (HOSPITAL_COMMUNITY): Payer: Self-pay

## 2023-05-18 ENCOUNTER — Other Ambulatory Visit: Payer: Self-pay | Admitting: Endocrinology

## 2023-05-18 ENCOUNTER — Other Ambulatory Visit: Payer: Self-pay

## 2023-05-18 MED ORDER — METFORMIN HCL ER 750 MG PO TB24
1500.0000 mg | ORAL_TABLET | Freq: Every day | ORAL | 1 refills | Status: DC
Start: 2023-05-18 — End: 2023-11-10
  Filled 2023-05-18: qty 180, 90d supply, fill #0
  Filled 2023-08-10: qty 180, 90d supply, fill #1

## 2023-05-30 ENCOUNTER — Other Ambulatory Visit: Payer: Self-pay | Admitting: Endocrinology

## 2023-05-30 DIAGNOSIS — E063 Autoimmune thyroiditis: Secondary | ICD-10-CM

## 2023-06-01 ENCOUNTER — Other Ambulatory Visit: Payer: Self-pay

## 2023-06-01 ENCOUNTER — Other Ambulatory Visit (HOSPITAL_COMMUNITY): Payer: Self-pay

## 2023-06-01 MED ORDER — LEVOTHYROXINE SODIUM 75 MCG PO TABS
75.0000 ug | ORAL_TABLET | Freq: Every day | ORAL | 1 refills | Status: DC
Start: 2023-06-01 — End: 2023-12-03
  Filled 2023-06-01: qty 90, 90d supply, fill #0
  Filled 2023-08-26: qty 90, 90d supply, fill #1

## 2023-06-02 ENCOUNTER — Other Ambulatory Visit (HOSPITAL_COMMUNITY): Payer: Self-pay

## 2023-06-03 DIAGNOSIS — Z01419 Encounter for gynecological examination (general) (routine) without abnormal findings: Secondary | ICD-10-CM | POA: Diagnosis not present

## 2023-06-10 ENCOUNTER — Other Ambulatory Visit: Payer: Self-pay

## 2023-06-10 ENCOUNTER — Other Ambulatory Visit (HOSPITAL_COMMUNITY): Payer: Self-pay

## 2023-06-10 DIAGNOSIS — Z1231 Encounter for screening mammogram for malignant neoplasm of breast: Secondary | ICD-10-CM | POA: Diagnosis not present

## 2023-06-12 ENCOUNTER — Other Ambulatory Visit (HOSPITAL_COMMUNITY): Payer: Self-pay

## 2023-07-17 ENCOUNTER — Telehealth: Payer: Self-pay | Admitting: Hematology and Oncology

## 2023-07-17 NOTE — Telephone Encounter (Signed)
Rescheduled appointment per provider PAL. Patient is aware of the changes made to her upcoming appointment. 

## 2023-07-23 ENCOUNTER — Ambulatory Visit: Payer: 59 | Admitting: Hematology and Oncology

## 2023-07-27 ENCOUNTER — Other Ambulatory Visit (HOSPITAL_COMMUNITY): Payer: Self-pay

## 2023-07-27 MED ORDER — PREDNISONE 5 MG PO TABS
ORAL_TABLET | ORAL | 0 refills | Status: DC
  Filled 2023-07-27: qty 48, 12d supply, fill #0

## 2023-08-10 NOTE — Progress Notes (Signed)
Patient Care Team: Milus Height, [place] as PCP - General (Nurse Practitioner) Dorothy Puffer, MD as Consulting Physician (Radiation Oncology) Harriette Bouillon, MD as Consulting Physician (General Surgery) Serena Croissant, MD as Consulting Physician (Hematology and Oncology)  DIAGNOSIS: No diagnosis found.  SUMMARY OF ONCOLOGIC HISTORY: Oncology History  Malignant neoplasm of upper-outer quadrant of right breast in female, estrogen receptor positive (HCC)  03/01/2019 Initial Diagnosis   Diagnostic mammogram detected 2.5cm calcifications in right breast. 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    03/09/2019 Cancer Staging   Staging form: Breast, AJCC 8th Edition - Clinical stage from 03/09/2019: Stage 0 (cTis (DCIS), cN0, cM0, ER+, PR+) - Signed by Loa Socks, NP on 03/09/2019   04/06/2019 Genetic Testing   Negative genetic testing on the common hereditary cancer panel.  The Common Hereditary Gene Panel offered by [facility] includes sequencing and/or deletion duplication testing of the following 48 genes: APC, ATM, AXIN2, BARD1, BMPR1A, BRCA1, BRCA2, BRIP1, CDH1, CDK4, CDKN2A (p14ARF), CDKN2A (p16INK4a), CHEK2, CTNNA1, DICER1, EPCAM (Deletion/duplication testing only), GREM1 (promoter region deletion/duplication testing only), KIT, MEN1, MLH1, MSH2, MSH3, MSH6, MUTYH, NBN, NF1, NHTL1, PALB2, PDGFRA, PMS2, POLD1, POLE, PTEN, RAD50, RAD51C, RAD51D, RNF43, SDHB, SDHC, SDHD, SMAD4, SMARCA4. STK11, TP53, TSC1, TSC2, and VHL.  The following genes were evaluated for sequence changes only: SDHA and HOXB13 c.251G>A variant only. The report date is Apr 06, 2019.   04/20/2019 Surgery   Right lumpectomy ([facility]): DCIS, 1.5cm, intermediate grade, ER+ 95%, PR+ 95%, clear margins.    04/20/2019 Cancer Staging   Staging form: Breast, AJCC 8th Edition - Pathologic stage from 04/20/2019: Stage 0 (pTis (DCIS), pN0, cM0, ER+, PR+) - Signed by Loa Socks, NP on 10/23/2019    05/31/2019 -  Radiation Therapy   Adjuvant XRT   08/2019 -  Anti-estrogen oral therapy   Tamoxifen daily     CHIEF COMPLIANT: Tamoxifen  INTERVAL HISTORY: Nicole Schwartz is a 52 y.o. with above-mentioned history of right breast DCIS who underwent a lumpectomy and radiation therapy, currently on antiestrogen therapy with tamoxifen. She presents to the clinic today for follow-up.    ALLERGIES:  has No Known Allergies.  MEDICATIONS:  Current Outpatient Medications  Medication Sig Dispense Refill   diclofenac (VOLTAREN) 75 MG EC tablet Take 1 tablet (75 mg total) by mouth 2 (two) times daily. 180 tablet 1   fenofibrate (TRICOR) 145 MG tablet Take 1 tablet (145 mg total) by mouth daily. 30 tablet 5   hydroxychloroquine (PLAQUENIL) 200 MG tablet Take 1 tablet (200 mg total) by mouth 2 (two) times daily with food or milk 180 tablet 1   levothyroxine (SYNTHROID) 75 MCG tablet Take 1 tablet (75 mcg total) by mouth daily before breakfast. 90 tablet 1   lisinopril-hydrochlorothiazide (ZESTORETIC) 10-12.5 MG tablet Take 1 tablet by mouth daily. 90 tablet 3   metFORMIN (GLUCOPHAGE-XR) 750 MG 24 hr tablet Take 2 tablets (1,500 mg total) by mouth daily. 180 tablet 1   predniSONE (DELTASONE) 5 MG tablet Take as directed on sheet 48 tablet 0   tamoxifen (NOLVADEX) 20 MG tablet Take 1 tablet (20 mg total) by mouth daily. 90 tablet 3   topiramate (TOPAMAX) 25 MG tablet Take 1 tablet (25 mg total) by mouth 2 (two) times daily. 60 tablet 3   No current facility-administered medications for this visit.    PHYSICAL EXAMINATION: ECOG PERFORMANCE STATUS: {CHL ONC ECOG [id number]}  There were no vitals filed for this visit. There were no  vitals filed for this visit.  BREAST:*** No palpable masses or nodules in either right or left breasts. No palpable axillary supraclavicular or infraclavicular adenopathy no breast tenderness or nipple discharge. (exam performed in the presence of a  chaperone)  LABORATORY DATA:  I have reviewed the data as listed    Latest Ref Rng & Units 04/29/2023   11:06 AM 10/27/2022   10:59 AM 04/14/2022   11:10 AM  CMP  Glucose 70 - 99 mg/dL 85  84  85   BUN 6 - 23 mg/dL 18  14  18    Creatinine 0.40 - 1.20 mg/dL 8.65  7.84  6.96   Sodium 135 - 145 mEq/L 139  134  139   Potassium 3.5 - 5.1 mEq/L 3.9  3.7  3.8   Chloride 96 - 112 mEq/L 104  99  102   CO2 19 - 32 mEq/L 28  26  22    Calcium 8.4 - 10.5 mg/dL 9.2  9.3  9.7   Total Protein 6.0 - 8.3 g/dL 6.8  7.0  6.6   Total Bilirubin 0.2 - 1.2 mg/dL 0.3  0.3  0.3   Alkaline Phos 39 - 117 U/L 31  31  28    AST 0 - 37 U/L 15  14  13    ALT 0 - 35 U/L 14  12  13      Lab Results  Component Value Date   WBC 5.4 04/15/2019   HGB 14.3 04/15/2019   HCT 41.6 04/15/2019   MCV 86.1 04/15/2019   PLT 281 04/15/2019    ASSESSMENT & PLAN:  No problem-specific Assessment & Plan notes found for this encounter.    No orders of the defined types were placed in this encounter.  The patient has a good understanding of the overall plan. she agrees with it. she will call with any problems that may develop before the next visit here. Total time spent: 30 mins including face to face time and time spent for planning, charting and co-ordination of care   Sherlyn Lick, CMA 08/10/23    I Janan Ridge am acting as a Neurosurgeon for [employer]  ***

## 2023-08-13 ENCOUNTER — Inpatient Hospital Stay: Payer: Commercial Managed Care - PPO | Attending: Hematology and Oncology | Admitting: Hematology and Oncology

## 2023-08-13 ENCOUNTER — Other Ambulatory Visit (HOSPITAL_COMMUNITY): Payer: Self-pay

## 2023-08-13 VITALS — BP 136/51 | HR 84 | Temp 97.9°F | Resp 18 | Ht 61.5 in | Wt 235.8 lb

## 2023-08-13 DIAGNOSIS — E282 Polycystic ovarian syndrome: Secondary | ICD-10-CM | POA: Diagnosis not present

## 2023-08-13 DIAGNOSIS — N8500 Endometrial hyperplasia, unspecified: Secondary | ICD-10-CM | POA: Insufficient documentation

## 2023-08-13 DIAGNOSIS — Z79810 Long term (current) use of selective estrogen receptor modulators (SERMs): Secondary | ICD-10-CM | POA: Diagnosis not present

## 2023-08-13 DIAGNOSIS — C50411 Malignant neoplasm of upper-outer quadrant of right female breast: Secondary | ICD-10-CM | POA: Diagnosis not present

## 2023-08-13 DIAGNOSIS — Z923 Personal history of irradiation: Secondary | ICD-10-CM | POA: Insufficient documentation

## 2023-08-13 DIAGNOSIS — Z170 Estrogen receptor positive status [ER+]: Secondary | ICD-10-CM | POA: Insufficient documentation

## 2023-08-13 DIAGNOSIS — R232 Flushing: Secondary | ICD-10-CM | POA: Diagnosis not present

## 2023-08-13 MED ORDER — TAMOXIFEN CITRATE 20 MG PO TABS
20.0000 mg | ORAL_TABLET | Freq: Every day | ORAL | 3 refills | Status: DC
  Filled 2023-08-13: qty 90, 90d supply, fill #0
  Filled 2023-11-17: qty 90, 90d supply, fill #1
  Filled 2024-02-09: qty 90, 90d supply, fill #2
  Filled 2024-05-18: qty 90, 90d supply, fill #3

## 2023-08-13 NOTE — Assessment & Plan Note (Addendum)
Antiestrogen treatment plan3/31/2020:Diagnostic mammogram detected 2.5cm calcifications in right breast. 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    Treatment plan: 1. Adjuvant radiation therapy started 05/31/2019 2. Followed by antiestrogen therapy with tamoxifen 5 years, started 08/02/2019   Tamoxifen toxicities:   1.  Mild hot flashes: 2. endometrial hyperplasia: She is going to get a D&C hysteroscopy performed by her gynecologist for further evaluation.     Breast cancer surveillance: 1.  Breast exam 08/13/2023: Benign 2. mammogram 06/11/2023 at [facility]: Benign breast density category B   Weight issues: Her insurance does not cover GLP-1 antagonist.  She is working hard to lose weight by exercising regularly.   Return to clinic in 1 year for follow-up

## 2023-08-19 ENCOUNTER — Encounter: Payer: Self-pay | Admitting: Obstetrics and Gynecology

## 2023-09-09 ENCOUNTER — Other Ambulatory Visit: Payer: Self-pay

## 2023-09-11 ENCOUNTER — Other Ambulatory Visit: Payer: Self-pay

## 2023-09-11 ENCOUNTER — Encounter: Payer: Self-pay | Admitting: Endocrinology

## 2023-09-11 ENCOUNTER — Other Ambulatory Visit (HOSPITAL_COMMUNITY): Payer: Self-pay

## 2023-09-11 DIAGNOSIS — I10 Essential (primary) hypertension: Secondary | ICD-10-CM

## 2023-09-11 MED ORDER — FENOFIBRATE 145 MG PO TABS
145.0000 mg | ORAL_TABLET | Freq: Every day | ORAL | 5 refills | Status: DC
Start: 2023-09-11 — End: 2024-03-03
  Filled 2023-09-11: qty 30, 30d supply, fill #0
  Filled 2023-10-08: qty 30, 30d supply, fill #1
  Filled 2023-11-09: qty 30, 30d supply, fill #2
  Filled 2023-12-07: qty 30, 30d supply, fill #3
  Filled 2024-01-06: qty 30, 30d supply, fill #4
  Filled 2024-02-06: qty 30, 30d supply, fill #5

## 2023-10-22 DIAGNOSIS — M0589 Other rheumatoid arthritis with rheumatoid factor of multiple sites: Secondary | ICD-10-CM | POA: Diagnosis not present

## 2023-10-22 DIAGNOSIS — Z6841 Body Mass Index (BMI) 40.0 and over, adult: Secondary | ICD-10-CM | POA: Diagnosis not present

## 2023-10-22 DIAGNOSIS — Z79899 Other long term (current) drug therapy: Secondary | ICD-10-CM | POA: Diagnosis not present

## 2023-10-22 DIAGNOSIS — M79642 Pain in left hand: Secondary | ICD-10-CM | POA: Diagnosis not present

## 2023-10-22 DIAGNOSIS — M79641 Pain in right hand: Secondary | ICD-10-CM | POA: Diagnosis not present

## 2023-10-22 DIAGNOSIS — D0511 Intraductal carcinoma in situ of right breast: Secondary | ICD-10-CM | POA: Diagnosis not present

## 2023-11-02 ENCOUNTER — Other Ambulatory Visit: Payer: Commercial Managed Care - PPO

## 2023-11-02 ENCOUNTER — Telehealth: Payer: Self-pay

## 2023-11-02 DIAGNOSIS — R7301 Impaired fasting glucose: Secondary | ICD-10-CM

## 2023-11-02 DIAGNOSIS — R7303 Prediabetes: Secondary | ICD-10-CM | POA: Diagnosis not present

## 2023-11-02 DIAGNOSIS — E039 Hypothyroidism, unspecified: Secondary | ICD-10-CM | POA: Diagnosis not present

## 2023-11-02 DIAGNOSIS — C50911 Malignant neoplasm of unspecified site of right female breast: Secondary | ICD-10-CM | POA: Diagnosis not present

## 2023-11-02 DIAGNOSIS — E282 Polycystic ovarian syndrome: Secondary | ICD-10-CM | POA: Diagnosis not present

## 2023-11-02 DIAGNOSIS — H35033 Hypertensive retinopathy, bilateral: Secondary | ICD-10-CM | POA: Diagnosis not present

## 2023-11-02 DIAGNOSIS — E063 Autoimmune thyroiditis: Secondary | ICD-10-CM | POA: Diagnosis not present

## 2023-11-02 DIAGNOSIS — E785 Hyperlipidemia, unspecified: Secondary | ICD-10-CM | POA: Diagnosis not present

## 2023-11-02 DIAGNOSIS — E782 Mixed hyperlipidemia: Secondary | ICD-10-CM | POA: Diagnosis not present

## 2023-11-02 DIAGNOSIS — I10 Essential (primary) hypertension: Secondary | ICD-10-CM | POA: Diagnosis not present

## 2023-11-02 DIAGNOSIS — Z0000 Encounter for general adult medical examination without abnormal findings: Secondary | ICD-10-CM | POA: Diagnosis not present

## 2023-11-02 DIAGNOSIS — M069 Rheumatoid arthritis, unspecified: Secondary | ICD-10-CM | POA: Diagnosis not present

## 2023-11-02 NOTE — Telephone Encounter (Signed)
Orders Placed This Encounter  Procedures   T4, free   Hemoglobin A1c   Lipid panel   Comprehensive metabolic panel   TSH

## 2023-11-03 LAB — COMPREHENSIVE METABOLIC PANEL
AG Ratio: 1.7 (calc) (ref 1.0–2.5)
ALT: 14 U/L (ref 6–29)
AST: 16 U/L (ref 10–35)
Albumin: 4.3 g/dL (ref 3.6–5.1)
Alkaline phosphatase (APISO): 32 U/L — ABNORMAL LOW (ref 37–153)
BUN: 14 mg/dL (ref 7–25)
CO2: 27 mmol/L (ref 20–32)
Calcium: 9.6 mg/dL (ref 8.6–10.4)
Chloride: 102 mmol/L (ref 98–110)
Creat: 0.71 mg/dL (ref 0.50–1.03)
Globulin: 2.5 g/dL (ref 1.9–3.7)
Glucose, Bld: 94 mg/dL (ref 65–99)
Potassium: 3.7 mmol/L (ref 3.5–5.3)
Sodium: 138 mmol/L (ref 135–146)
Total Bilirubin: 0.3 mg/dL (ref 0.2–1.2)
Total Protein: 6.8 g/dL (ref 6.1–8.1)

## 2023-11-03 LAB — LIPID PANEL
Cholesterol: 136 mg/dL (ref <200)
HDL: 50 mg/dL (ref >=50)
LDL Cholesterol (Calc): 68 mg/dL
Non-HDL Cholesterol (Calc): 86 mg/dL (ref <130)
Total CHOL/HDL Ratio: 2.7 (calc) (ref <5.0)
Triglycerides: 92 mg/dL (ref <150)

## 2023-11-03 LAB — HEMOGLOBIN A1C
Hgb A1c MFr Bld: 5.3 %{Hb} (ref <5.7)
Mean Plasma Glucose: 105 mg/dL
eAG (mmol/L): 5.8 mmol/L

## 2023-11-03 LAB — T4, FREE: Free T4: 1.5 ng/dL (ref 0.8–1.8)

## 2023-11-03 LAB — TSH: TSH: 1.12 m[IU]/L

## 2023-11-09 ENCOUNTER — Encounter: Payer: Self-pay | Admitting: Endocrinology

## 2023-11-09 ENCOUNTER — Other Ambulatory Visit (HOSPITAL_COMMUNITY): Payer: Self-pay

## 2023-11-09 ENCOUNTER — Ambulatory Visit: Payer: Commercial Managed Care - PPO | Admitting: Endocrinology

## 2023-11-09 ENCOUNTER — Other Ambulatory Visit: Payer: Self-pay

## 2023-11-09 VITALS — BP 122/70 | HR 81 | Resp 20 | Ht 61.5 in | Wt 238.6 lb

## 2023-11-09 DIAGNOSIS — E282 Polycystic ovarian syndrome: Secondary | ICD-10-CM

## 2023-11-09 DIAGNOSIS — E063 Autoimmune thyroiditis: Secondary | ICD-10-CM | POA: Diagnosis not present

## 2023-11-09 DIAGNOSIS — E782 Mixed hyperlipidemia: Secondary | ICD-10-CM | POA: Diagnosis not present

## 2023-11-09 DIAGNOSIS — R7301 Impaired fasting glucose: Secondary | ICD-10-CM | POA: Diagnosis not present

## 2023-11-09 DIAGNOSIS — I10 Essential (primary) hypertension: Secondary | ICD-10-CM | POA: Diagnosis not present

## 2023-11-09 NOTE — Progress Notes (Signed)
Outpatient Endocrinology Note Nicole Myrick Mcnairy, MD  11/09/23  Patient's Name: Nicole Schwartz    DOB: March 01, 1971    MRN: [id number]  REASON OF VISIT: Follow-up for hypothyroidism  PCP: Milus Height, PA  HISTORY OF PRESENT ILLNESS:   Nicole Schwartz is a 52 y.o. old female with past medical history as listed below is presented for a follow up for hypothyroidism /impaired fasting glucose Nicole Schwartz /hyperlipidemia.  Pertinent History: Patient was previously seen by Dr. Lucianne Muss and was last time seen in June 2024.  # Primary hypothyroidism -Patient has had a goiter since at least 1998, baseline TSH was 5.4 when was started on levothyroxine around 2000.  # Obesity : In 07/2015 she was started on Belviq and with this she had previously lost over 20 pounds with baseline weight of 258 pounds. However this was stopped because of lack of insurance coverage. Ozempic was denied by her insurance despite history of impaired fasting glucose. She was taking phentermine since 11/07/2020. Since she felt jittery with this she was then taking this every other day. However appears that she did not have a refill and did not request one in late 2023. She was also taking 25 mg topiramate every day which helps her better control her appetite; again for some reason she did not request a refill and has not taken for several months, later stopped as it made her sedated.   She is still able to reduce portions and carbohydrates and maintain her weight.  # Impaired fasting glucose / PCOS -She has been on metformin extended release 750 mg 2 tablets in the evening daily.  Hemoglobin A1c has been in the normal range consistently.  # Hyperlipidemia -She was previously on Crestor and fenofibrate combination.  She has been lately on fenofibrate.  LDL has been consistently less than 100.  # Hypertension She has had hypertension since at least 2004. Has been on ACE inhibitor since about 2005. Blood pressure is previously  controlled with lisinopril hydrochlorothiazide. However HCTZ was stopped in 5/23 because of relatively low blood pressure.   Lisinopril was increased to 20 mg in 12/23. She does check her blood pressure at work or at home and it is usually 130-140 systolic.  Lately she has been on lisinopril Hydrochlorothiazide 10-12.5 mg daily since May/June 2024.   Does not see a PCP regularly   Interval history Patient has been taking levothyroxine 75 mcg daily.  She has no hypo and hyperthyroid symptoms.  Recent thyroid function test normal.  No palpitation or heat intolerance.  Body weight has been relatively stable.  She had a stop topiramate several months ago.  She has been taking metformin.  Recent lab results reviewed with normal electrolytes, renal function, lipid levels are acceptable with LDL 68, hemoglobin A1c 5.3% and normal thyroid function test.  REVIEW OF SYSTEMS:  As per history of present illness.   PAST MEDICAL HISTORY: Past Medical History:  Diagnosis Date   Arthritis    RA   Breast cancer (HCC) 01/2019   Right Breast Cancer   Family history of breast cancer    Hypertension    Hypothyroidism    Personal history of radiation therapy 2020   Right Breast Cancer    PAST SURGICAL HISTORY: Past Surgical History:  Procedure Laterality Date   BREAST LUMPECTOMY Right 04/20/2019   BREAST LUMPECTOMY WITH RADIOACTIVE SEED LOCALIZATION Right 04/20/2019   Procedure: RIGHT BREAST LUMPECTOMY WITH RADIOACTIVE SEED LOCALIZATION;  Surgeon: Harriette Bouillon, MD;  Location: [facility];  Service: General;  Laterality: Right;   CESAREAN SECTION     x2    ALLERGIES: No Known Allergies  FAMILY HISTORY:  Family History  Problem Relation Age of Onset   Thyroid disease Mother    Diabetes Maternal Grandfather    Hypertension Maternal Grandfather    Diabetes Maternal Grandmother    Breast cancer Paternal Grandmother     SOCIAL HISTORY: Social History   Socioeconomic History    Marital status: Married    Spouse name: Not on file   Number of children: Not on file   Years of education: Not on file   Highest education level: Not on file  Occupational History   Not on file  Tobacco Use   Smoking status: Never   Smokeless tobacco: Never  Vaping Use   Vaping status: Never Used  Substance and Sexual Activity   Alcohol use: Not Currently   Drug use: Never   Sexual activity: Yes    Comment: nothing husband unable to produce children  Other Topics Concern   Not on file  Social History Narrative   Not on file   Social Determinants of Health   Financial Resource Strain: Not on file  Food Insecurity: Not on file  Transportation Needs: No Transportation Needs (05/05/2019)   PRAPARE - Administrator, Civil Service (Medical): No    Lack of Transportation (Non-Medical): No  Physical Activity: Not on file  Stress: Not on file  Social Connections: Not on file    MEDICATIONS:  Current Outpatient Medications  Medication Sig Dispense Refill   diclofenac (VOLTAREN) 75 MG EC tablet Take 1 tablet (75 mg total) by mouth 2 (two) times daily. 180 tablet 1   fenofibrate (TRICOR) 145 MG tablet Take 1 tablet (145 mg total) by mouth daily. 30 tablet 5   hydroxychloroquine (PLAQUENIL) 200 MG tablet Take 1 tablet (200 mg total) by mouth 2 (two) times daily with food or milk 180 tablet 1   levothyroxine (SYNTHROID) 75 MCG tablet Take 1 tablet (75 mcg total) by mouth daily before breakfast. 90 tablet 1   lisinopril-hydrochlorothiazide (ZESTORETIC) 10-12.5 MG tablet Take 1 tablet by mouth daily. 90 tablet 3   metFORMIN (GLUCOPHAGE-XR) 750 MG 24 hr tablet Take 2 tablets (1,500 mg total) by mouth daily. 180 tablet 1   predniSONE (DELTASONE) 5 MG tablet Take as directed on sheet 48 tablet 0   tamoxifen (NOLVADEX) 20 MG tablet Take 1 tablet (20 mg total) by mouth daily. 90 tablet 3   topiramate (TOPAMAX) 25 MG tablet Take 1 tablet (25 mg total) by mouth 2 (two) times  daily. 60 tablet 3   No current facility-administered medications for this visit.    PHYSICAL EXAM: Vitals:   11/09/23 1603  BP: 122/70  Pulse: 81  Resp: 20  SpO2: 98%  Weight: 238 lb 9.6 oz (108.2 kg)  Height: 5' 1.5" (1.562 m)   Body mass index is 44.35 kg/m.  Wt Readings from Last 3 Encounters:  11/09/23 238 lb 9.6 oz (108.2 kg)  08/13/23 235 lb 12.8 oz (107 kg)  05/04/23 231 lb 6.4 oz (105 kg)    General: Well developed, well nourished female in no apparent distress.  HEENT: AT/[place], no external lesions. Hearing intact to the spoken word Eyes:  Conjunctiva clear and no icterus. Neck: Trachea midline, neck supple  Neurologic: Alert, oriented, normal speech Extremities: No pedal pitting edema, no tremors of outstretched hands Skin: Warm, color good.  Psychiatric: Does not  appear depressed or anxious  PERTINENT HISTORIC LABORATORY AND IMAGING STUDIES:  All pertinent laboratory results were reviewed. Please see HPI also for further details.   TSH  Date Value Ref Range Status  11/02/2023 1.12 mIU/L Final    Comment:              Reference Range .           > or = 20 Years  0.40-4.50 .                Pregnancy Ranges           First trimester    0.26-2.66           Second trimester   0.55-2.73           Third trimester    0.43-2.91   04/29/2023 1.39 0.35 - 5.50 uIU/mL Final  10/27/2022 1.65 0.35 - 5.50 uIU/mL Final     ASSESSMENT / PLAN  1. Acquired autoimmune hypothyroidism   2. Impaired fasting glucose   3. Mixed hyperlipidemia   4. Essential hypertension   5. Severe obesity (BMI >= 40) (HCC)   6. PCOS (polycystic ovarian syndrome)    Patient had recent lab results for lipid panel, electrolytes, thyroid function test, renal and liver function test are acceptable.  Plan: -No change in medications. -Continue levothyroxine 75 mcg daily. -Continue fenofibrate 145 mg daily. -Continue metformin ER 750 mg 2 tablets daily. -Continue on  lisinopril/hydrochlorothiazide 10/12.5 mg daily.  Discussed in detail about weight management.  Most important is the lifestyle modification with diet plan, portion control and limiting carbohydrates.  Advised for exercise regularly.  Patient had taken phentermine and Topamax in the past but stopped due to side effect as mentioned in HPI.  Patient states her medical insurance does not cover weight loss medication.  Consider referral to weight management clinic however as medication are not covered, probably is not beneficial.  Patient is advised to follow-up regularly with primary care provider as recommended as well.  Diagnoses and all orders for this visit:  Acquired autoimmune hypothyroidism -     T4, free -     TSH  Impaired fasting glucose -     Hemoglobin A1c  Mixed hyperlipidemia  Essential hypertension  Severe obesity (BMI >= 40) (HCC)  PCOS (polycystic ovarian syndrome)    DISPOSITION Follow up in clinic in 6 months suggested.  All questions answered and patient verbalized understanding of the plan.  Nicole Machelle Raybon, MD [facility] Endocrinology [facility] [address] [place], [address] [place], [place] [address] Phone # [phone]  At least part of this note was generated using voice recognition software. Inadvertent word errors may have occurred, which were not recognized during the proofreading process.

## 2023-11-10 ENCOUNTER — Other Ambulatory Visit: Payer: Self-pay

## 2023-11-10 ENCOUNTER — Encounter: Payer: Self-pay | Admitting: Endocrinology

## 2023-11-10 ENCOUNTER — Other Ambulatory Visit (HOSPITAL_COMMUNITY): Payer: Self-pay

## 2023-11-10 MED ORDER — METFORMIN HCL ER 750 MG PO TB24
1500.0000 mg | ORAL_TABLET | Freq: Every day | ORAL | 1 refills | Status: DC
  Filled 2023-11-10: qty 180, 90d supply, fill #0
  Filled 2024-02-09: qty 180, 90d supply, fill #1

## 2023-11-10 MED ORDER — DICLOFENAC SODIUM 75 MG PO TBEC
75.0000 mg | DELAYED_RELEASE_TABLET | Freq: Two times a day (BID) | ORAL | 1 refills | Status: DC
  Filled 2023-11-10: qty 180, 90d supply, fill #0
  Filled 2024-02-06: qty 180, 90d supply, fill #1

## 2023-11-10 MED ORDER — HYDROXYCHLOROQUINE SULFATE 200 MG PO TABS
200.0000 mg | ORAL_TABLET | Freq: Two times a day (BID) | ORAL | 0 refills | Status: DC
  Filled 2023-11-10: qty 180, 90d supply, fill #0

## 2023-11-28 ENCOUNTER — Other Ambulatory Visit: Payer: Self-pay

## 2023-11-30 ENCOUNTER — Encounter: Payer: Self-pay | Admitting: Endocrinology

## 2023-12-03 ENCOUNTER — Other Ambulatory Visit: Payer: Self-pay

## 2023-12-03 ENCOUNTER — Other Ambulatory Visit (HOSPITAL_COMMUNITY): Payer: Self-pay

## 2023-12-03 DIAGNOSIS — E063 Autoimmune thyroiditis: Secondary | ICD-10-CM

## 2023-12-03 MED ORDER — LEVOTHYROXINE SODIUM 75 MCG PO TABS
75.0000 ug | ORAL_TABLET | Freq: Every day | ORAL | 1 refills | Status: DC
  Filled 2023-12-03: qty 90, 90d supply, fill #0
  Filled 2024-02-20: qty 90, 90d supply, fill #1

## 2024-02-06 ENCOUNTER — Other Ambulatory Visit (HOSPITAL_COMMUNITY): Payer: Self-pay

## 2024-02-07 ENCOUNTER — Other Ambulatory Visit: Payer: Self-pay

## 2024-02-08 ENCOUNTER — Other Ambulatory Visit: Payer: Self-pay

## 2024-02-08 ENCOUNTER — Other Ambulatory Visit (HOSPITAL_COMMUNITY): Payer: Self-pay

## 2024-02-08 MED ORDER — HYDROXYCHLOROQUINE SULFATE 200 MG PO TABS
200.0000 mg | ORAL_TABLET | Freq: Two times a day (BID) | ORAL | 0 refills | Status: DC
  Filled 2024-02-08: qty 120, 60d supply, fill #0

## 2024-03-03 ENCOUNTER — Other Ambulatory Visit: Payer: Self-pay | Admitting: Endocrinology

## 2024-03-03 ENCOUNTER — Other Ambulatory Visit (HOSPITAL_COMMUNITY): Payer: Self-pay

## 2024-03-03 DIAGNOSIS — I10 Essential (primary) hypertension: Secondary | ICD-10-CM

## 2024-03-03 MED ORDER — FENOFIBRATE 145 MG PO TABS
145.0000 mg | ORAL_TABLET | Freq: Every day | ORAL | 3 refills | Status: AC
  Filled 2024-03-14: qty 90, 90d supply, fill #0
  Filled 2024-06-08: qty 90, 90d supply, fill #1
  Filled 2024-09-07 – 2024-11-08 (×2): qty 90, 90d supply, fill #2

## 2024-03-14 ENCOUNTER — Other Ambulatory Visit (HOSPITAL_COMMUNITY): Payer: Self-pay

## 2024-04-08 ENCOUNTER — Other Ambulatory Visit: Payer: Self-pay

## 2024-04-20 ENCOUNTER — Other Ambulatory Visit: Payer: Self-pay

## 2024-04-21 ENCOUNTER — Other Ambulatory Visit (HOSPITAL_COMMUNITY): Payer: Self-pay

## 2024-04-21 ENCOUNTER — Other Ambulatory Visit: Payer: Self-pay

## 2024-04-21 DIAGNOSIS — M79675 Pain in left toe(s): Secondary | ICD-10-CM | POA: Diagnosis not present

## 2024-04-21 DIAGNOSIS — M0589 Other rheumatoid arthritis with rheumatoid factor of multiple sites: Secondary | ICD-10-CM | POA: Diagnosis not present

## 2024-04-21 DIAGNOSIS — Z6841 Body Mass Index (BMI) 40.0 and over, adult: Secondary | ICD-10-CM | POA: Diagnosis not present

## 2024-04-21 DIAGNOSIS — Z79899 Other long term (current) drug therapy: Secondary | ICD-10-CM | POA: Diagnosis not present

## 2024-04-21 DIAGNOSIS — M79674 Pain in right toe(s): Secondary | ICD-10-CM | POA: Diagnosis not present

## 2024-04-21 MED ORDER — HYDROXYCHLOROQUINE SULFATE 200 MG PO TABS
200.0000 mg | ORAL_TABLET | Freq: Two times a day (BID) | ORAL | 1 refills | Status: DC
  Filled 2024-04-21: qty 180, 90d supply, fill #0
  Filled 2024-07-23: qty 180, 90d supply, fill #1

## 2024-04-21 MED ORDER — DICLOFENAC SODIUM 75 MG PO TBEC
75.0000 mg | DELAYED_RELEASE_TABLET | Freq: Two times a day (BID) | ORAL | 1 refills | Status: DC
  Filled 2024-04-21: qty 180, 90d supply, fill #0
  Filled 2024-08-11: qty 180, 90d supply, fill #1

## 2024-04-22 ENCOUNTER — Encounter: Payer: Self-pay | Admitting: Endocrinology

## 2024-04-26 ENCOUNTER — Other Ambulatory Visit (HOSPITAL_COMMUNITY): Payer: Self-pay

## 2024-04-26 ENCOUNTER — Other Ambulatory Visit: Payer: Self-pay

## 2024-04-26 DIAGNOSIS — I10 Essential (primary) hypertension: Secondary | ICD-10-CM

## 2024-04-26 MED ORDER — LISINOPRIL-HYDROCHLOROTHIAZIDE 10-12.5 MG PO TABS
1.0000 | ORAL_TABLET | Freq: Every day | ORAL | 3 refills | Status: AC
  Filled 2024-04-26: qty 90, 90d supply, fill #0
  Filled 2024-07-23: qty 90, 90d supply, fill #1
  Filled 2024-10-21: qty 90, 90d supply, fill #2

## 2024-05-03 ENCOUNTER — Other Ambulatory Visit (HOSPITAL_COMMUNITY): Payer: Self-pay

## 2024-05-03 ENCOUNTER — Other Ambulatory Visit: Payer: Self-pay | Admitting: Endocrinology

## 2024-05-03 MED ORDER — METFORMIN HCL ER 750 MG PO TB24
1500.0000 mg | ORAL_TABLET | Freq: Every day | ORAL | 1 refills | Status: DC
  Filled 2024-05-03: qty 180, 90d supply, fill #0
  Filled 2024-08-11: qty 180, 90d supply, fill #1

## 2024-05-03 NOTE — Telephone Encounter (Signed)
 Refill request complete

## 2024-05-04 ENCOUNTER — Other Ambulatory Visit: Payer: Commercial Managed Care - PPO

## 2024-05-04 DIAGNOSIS — E063 Autoimmune thyroiditis: Secondary | ICD-10-CM | POA: Diagnosis not present

## 2024-05-04 DIAGNOSIS — R7301 Impaired fasting glucose: Secondary | ICD-10-CM | POA: Diagnosis not present

## 2024-05-04 LAB — TSH: TSH: 1.34 m[IU]/L

## 2024-05-04 LAB — HEMOGLOBIN A1C
Hgb A1c MFr Bld: 5.3 %
Mean Plasma Glucose: 105 mg/dL
eAG (mmol/L): 5.8 mmol/L

## 2024-05-04 LAB — T4, FREE: Free T4: 1.5 ng/dL (ref 0.8–1.8)

## 2024-05-05 ENCOUNTER — Ambulatory Visit: Payer: Self-pay | Admitting: Endocrinology

## 2024-05-09 ENCOUNTER — Ambulatory Visit: Payer: Commercial Managed Care - PPO | Admitting: Endocrinology

## 2024-05-09 ENCOUNTER — Encounter: Payer: Self-pay | Admitting: Endocrinology

## 2024-05-09 DIAGNOSIS — R7301 Impaired fasting glucose: Secondary | ICD-10-CM | POA: Diagnosis not present

## 2024-05-09 DIAGNOSIS — E063 Autoimmune thyroiditis: Secondary | ICD-10-CM | POA: Diagnosis not present

## 2024-05-09 DIAGNOSIS — R748 Abnormal levels of other serum enzymes: Secondary | ICD-10-CM | POA: Diagnosis not present

## 2024-05-09 DIAGNOSIS — R7303 Prediabetes: Secondary | ICD-10-CM | POA: Diagnosis not present

## 2024-05-09 DIAGNOSIS — E782 Mixed hyperlipidemia: Secondary | ICD-10-CM | POA: Diagnosis not present

## 2024-05-09 DIAGNOSIS — E282 Polycystic ovarian syndrome: Secondary | ICD-10-CM

## 2024-05-09 NOTE — Progress Notes (Unsigned)
 Outpatient Endocrinology Note [place] Arless Vineyard, MD  05/10/24  Patient's Name: Nicole Schwartz    DOB: 23-Oct-1971    MRN: [id number]  REASON OF VISIT: Follow-up for hypothyroidism  PCP: Redmon, Noelle, PA  HISTORY OF PRESENT ILLNESS:   Nicole Schwartz is a 53 y.o. old female with past medical history as listed below is presented for a follow up for hypothyroidism / impaired fasting glucose prediabetes / obesity / hyperlipidemia.  Pertinent History: Patient was previously seen by Dr. Hubert Madden and was last time seen in June 2024.  # Primary hypothyroidism -Patient has had a goiter since at least 1998, baseline TSH was 5.4 when was started on levothyroxine  around 2000.  # Obesity : In 07/2015 she was started on Belviq and with this she had previously lost over 20 pounds with baseline weight of 258 pounds. However this was stopped because of lack of insurance coverage. Ozempic  was denied by her insurance despite history of impaired fasting glucose. She was taking phentermine  since 11/07/2020. Since she felt jittery with this she was then taking this every other day. However appears that she did not have a refill and did not request one in late 2023. She was also taking 25 mg topiramate  every day which helps her better control her appetite; again for some reason she did not request a refill and has not taken for several months, later stopped as it made her sedated.   She is still able to reduce portions and carbohydrates and maintain her weight.  # Impaired fasting glucose / PCOS / Prediabetes -She has been on metformin  extended release 750 mg 2 tablets in the evening daily.  Hemoglobin A1c has been in the normal range consistently.  # Hyperlipidemia -She was previously on Crestor  and fenofibrate  combination.  She has been lately on fenofibrate .  LDL has been consistently less than 100.  # Hypertension She has had hypertension since at least 2004. Has been on ACE inhibitor since about 2005.  Blood pressure is previously controlled with lisinopril  hydrochlorothiazide . However HCTZ was stopped in 5/23 because of relatively low blood pressure.   Lisinopril  was increased to 20 mg in 12/23. She does check her blood pressure at work or at home and it is usually 130-140 systolic.  Lately she has been on lisinopril  Hydrochlorothiazide  10-12.5 mg daily since May/June 2024.   Does not see a PCP regularly   Interval history She has been taking levothyroxine  75 mcg daily.  No hypo and hyperthyroid symptoms.  Patient thyroid  function test normal.  She has been taking metformin .  Hemoglobin A1c 5.3% remained normal.  On the lab review noted to have mildly low alkaline phosphatase since May 2021.  She used to have normal alkaline phosphatase prior to that.  She has not been taking vitamin D, multivitamin or other supplements.  No other complaints today.   Latest Reference Range & Units 05/04/24 11:16  eAG (mmol/L) mmol/L 5.8  Hemoglobin A1C <5.7 % 5.3  TSH mIU/L 1.34  T4,Free(Direct) 0.8 - 1.8 ng/dL 1.5    REVIEW OF SYSTEMS:  As per history of present illness.   PAST MEDICAL HISTORY: Past Medical History:  Diagnosis Date   Arthritis    RA   Breast cancer (HCC) 01/2019   Right Breast Cancer   Family history of breast cancer    Hypertension    Hypothyroidism    Personal history of radiation therapy 2020   Right Breast Cancer    PAST SURGICAL HISTORY: Past Surgical History:  Procedure Laterality Date   BREAST LUMPECTOMY Right 04/20/2019   BREAST LUMPECTOMY WITH RADIOACTIVE SEED LOCALIZATION Right 04/20/2019   Procedure: RIGHT BREAST LUMPECTOMY WITH RADIOACTIVE SEED LOCALIZATION;  Surgeon: Sim Dryer, MD;  Location: [facility];  Service: General;  Laterality: Right;   CESAREAN SECTION     x2    ALLERGIES: No Known Allergies  FAMILY HISTORY:  Family History  Problem Relation Age of Onset   Thyroid  disease Mother    Diabetes Maternal Grandfather     Hypertension Maternal Grandfather    Diabetes Maternal Grandmother    Breast cancer Paternal Grandmother     SOCIAL HISTORY: Social History   Socioeconomic History   Marital status: Married    Spouse name: Not on file   Number of children: Not on file   Years of education: Not on file   Highest education level: Not on file  Occupational History   Not on file  Tobacco Use   Smoking status: Never   Smokeless tobacco: Never  Vaping Use   Vaping status: Never Used  Substance and Sexual Activity   Alcohol use: Not Currently   Drug use: Never   Sexual activity: Yes    Comment: nothing husband unable to produce children  Other Topics Concern   Not on file  Social History Narrative   Not on file   Social Drivers of Health   Financial Resource Strain: Not on file  Food Insecurity: Not on file  Transportation Needs: No Transportation Needs (05/05/2019)   PRAPARE - Administrator, Civil Service (Medical): No    Lack of Transportation (Non-Medical): No  Physical Activity: Not on file  Stress: Not on file  Social Connections: Not on file    MEDICATIONS:  Current Outpatient Medications  Medication Sig Dispense Refill   diclofenac  (VOLTAREN ) 75 MG EC tablet Take 1 tablet (75 mg total) by mouth 2 (two) times daily. 180 tablet 1   fenofibrate  (TRICOR ) 145 MG tablet Take 1 tablet (145 mg total) by mouth daily. 90 tablet 3   hydroxychloroquine  (PLAQUENIL ) 200 MG tablet Take 1 tablet (200 mg total) by mouth 2 (two) times daily with food or milk. 180 tablet 1   levothyroxine  (SYNTHROID ) 75 MCG tablet Take 1 tablet (75 mcg total) by mouth daily before breakfast. 90 tablet 1   lisinopril -hydrochlorothiazide  (ZESTORETIC ) 10-12.5 MG tablet Take 1 tablet by mouth daily. 90 tablet 3   metFORMIN  (GLUCOPHAGE -XR) 750 MG 24 hr tablet Take 2 tablets (1,500 mg total) by mouth daily. 180 tablet 1   predniSONE  (DELTASONE ) 5 MG tablet Take as directed on sheet 48 tablet 0   tamoxifen   (NOLVADEX ) 20 MG tablet Take 1 tablet (20 mg total) by mouth daily. 90 tablet 3   topiramate  (TOPAMAX ) 25 MG tablet Take 1 tablet (25 mg total) by mouth 2 (two) times daily. 60 tablet 3   No current facility-administered medications for this visit.    PHYSICAL EXAM: There were no vitals filed for this visit.  There is no height or weight on file to calculate BMI.  Wt Readings from Last 3 Encounters:  11/09/23 238 lb 9.6 oz (108.2 kg)  08/13/23 235 lb 12.8 oz (107 kg)  05/04/23 231 lb 6.4 oz (105 kg)    General: Well developed, well nourished female in no apparent distress.  HEENT: AT/[place], no external lesions. Hearing intact to the spoken word Eyes:  Conjunctiva clear and no icterus. Neck: Trachea midline, neck supple  Neurologic: Alert, oriented,  normal speech Extremities: No pedal pitting edema, no tremors of outstretched hands Skin: Warm, color good.  Psychiatric: Does not appear depressed or anxious  PERTINENT HISTORIC LABORATORY AND IMAGING STUDIES:  All pertinent laboratory results were reviewed. Please see HPI also for further details.   TSH  Date Value Ref Range Status  05/04/2024 1.34 mIU/L Final    Comment:              Reference Range .           > or = 20 Years  0.40-4.50 .                Pregnancy Ranges           First trimester    0.26-2.66           Second trimester   0.55-2.73           Third trimester    0.43-2.91   11/02/2023 1.12 mIU/L Final    Comment:              Reference Range .           > or = 20 Years  0.40-4.50 .                Pregnancy Ranges           First trimester    0.26-2.66           Second trimester   0.55-2.73           Third trimester    0.43-2.91   04/29/2023 1.39 0.35 - 5.50 uIU/mL Final     ASSESSMENT / PLAN  1. Acquired autoimmune hypothyroidism   2. Impaired fasting glucose   3. Mixed hyperlipidemia   4. Low serum alkaline phosphatase   5. Prediabetes   6. PCOS (polycystic ovarian syndrome)    Recent labs for  thyroid  function test and A1c normal.  Will check lipid panel annually.  Plan: -No change in medications. -Continue levothyroxine  75 mcg daily. -Continue fenofibrate  145 mg daily. -Continue metformin  ER 750 mg 2 tablets daily. -Continue on lisinopril /hydrochlorothiazide  10/12.5 mg daily.  Patient had taken phentermine  and Topamax  in the past but stopped due to side effect as mentioned in HPI.  Patient states her medical insurance does not cover weight loss medication.  Consider referral to weight management clinic however as medication are not covered, probably is not beneficial.  # Low alkaline phosphatase - Check bony specific alkaline phosphatase, Vitamin D, PTH and phosphorus with next set of lab.  Diagnoses and all orders for this visit:  Acquired autoimmune hypothyroidism  Impaired fasting glucose -     Hemoglobin A1c  Mixed hyperlipidemia -     Lipid panel  Low serum alkaline phosphatase -     Parathyroid hormone, intact (no Ca) -     Phosphorus -     VITAMIN D 25 Hydroxy (Vit-D Deficiency, Fractures) -     Alkaline phosphatase, bone specific  Prediabetes -     Hemoglobin A1c -     Comprehensive metabolic panel with GFR  PCOS (polycystic ovarian syndrome)     DISPOSITION Follow up in clinic in 6 months suggested.  Labs prior to follow-up visit as ordered.  All questions answered and patient verbalized understanding of the plan.  [place] Adalida Garver, MD [facility] Endocrinology [facility] [address] [place], [address] [place], [place] [address] Phone # [phone]  At least part of this note was generated  using voice recognition software. Inadvertent word errors may have occurred, which were not recognized during the proofreading process.

## 2024-05-25 ENCOUNTER — Other Ambulatory Visit (HOSPITAL_COMMUNITY): Payer: Self-pay

## 2024-05-25 ENCOUNTER — Other Ambulatory Visit: Payer: Self-pay | Admitting: Endocrinology

## 2024-05-25 DIAGNOSIS — E063 Autoimmune thyroiditis: Secondary | ICD-10-CM

## 2024-05-25 MED ORDER — LEVOTHYROXINE SODIUM 75 MCG PO TABS
75.0000 ug | ORAL_TABLET | Freq: Every day | ORAL | 3 refills | Status: AC
  Filled 2024-05-25: qty 90, 90d supply, fill #0
  Filled 2024-08-24: qty 90, 90d supply, fill #1
  Filled 2024-11-19: qty 90, 90d supply, fill #2

## 2024-05-26 ENCOUNTER — Other Ambulatory Visit (HOSPITAL_COMMUNITY): Payer: Self-pay

## 2024-05-26 MED ORDER — PREDNISONE 5 MG (48) PO TBPK
ORAL_TABLET | ORAL | 0 refills | Status: DC
  Filled 2024-05-26: qty 48, 12d supply, fill #0

## 2024-06-24 ENCOUNTER — Other Ambulatory Visit: Payer: Self-pay

## 2024-06-30 DIAGNOSIS — Z1231 Encounter for screening mammogram for malignant neoplasm of breast: Secondary | ICD-10-CM | POA: Diagnosis not present

## 2024-06-30 DIAGNOSIS — Z01419 Encounter for gynecological examination (general) (routine) without abnormal findings: Secondary | ICD-10-CM | POA: Diagnosis not present

## 2024-06-30 DIAGNOSIS — Z124 Encounter for screening for malignant neoplasm of cervix: Secondary | ICD-10-CM | POA: Diagnosis not present

## 2024-08-15 ENCOUNTER — Inpatient Hospital Stay: Payer: Commercial Managed Care - PPO | Attending: Hematology and Oncology | Admitting: Hematology and Oncology

## 2024-08-15 VITALS — BP 121/63 | HR 77 | Temp 98.3°F | Resp 16 | Ht 61.5 in | Wt 235.1 lb

## 2024-08-15 DIAGNOSIS — Z923 Personal history of irradiation: Secondary | ICD-10-CM | POA: Insufficient documentation

## 2024-08-15 DIAGNOSIS — Z79810 Long term (current) use of selective estrogen receptor modulators (SERMs): Secondary | ICD-10-CM | POA: Insufficient documentation

## 2024-08-15 DIAGNOSIS — C50411 Malignant neoplasm of upper-outer quadrant of right female breast: Secondary | ICD-10-CM | POA: Insufficient documentation

## 2024-08-15 DIAGNOSIS — Z170 Estrogen receptor positive status [ER+]: Secondary | ICD-10-CM | POA: Insufficient documentation

## 2024-08-15 NOTE — Progress Notes (Signed)
 Patient Care Team: Redmon, Noelle, [place] as PCP - General (Nurse Practitioner) Dewey Rush, MD as Consulting Physician (Radiation Oncology) Vanderbilt Ned, MD as Consulting Physician (General Surgery) Odean Potts, MD as Consulting Physician (Hematology and Oncology)  DIAGNOSIS:  Encounter Diagnosis  Name Primary?   Malignant neoplasm of upper-outer quadrant of right breast in female, estrogen receptor positive (HCC) Yes    SUMMARY OF ONCOLOGIC HISTORY: Oncology History  Malignant neoplasm of upper-outer quadrant of right breast in female, estrogen receptor positive (HCC)  03/01/2019 Initial Diagnosis   Diagnostic mammogram detected 2.5cm calcifications in right breast. 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    03/09/2019 Cancer Staging   Staging form: Breast, AJCC 8th Edition - Clinical stage from 03/09/2019: Stage 0 (cTis (DCIS), cN0, cM0, ER+, PR+) - Signed by Crawford Morna Pickle, NP on 03/09/2019   04/06/2019 Genetic Testing   Negative genetic testing on the common hereditary cancer panel.  The Common Hereditary Gene Panel offered by [facility] includes sequencing and/or deletion duplication testing of the following 48 genes: APC, ATM, AXIN2, BARD1, BMPR1A, BRCA1, BRCA2, BRIP1, CDH1, CDK4, CDKN2A (p14ARF), CDKN2A (p16INK4a), CHEK2, CTNNA1, DICER1, EPCAM (Deletion/duplication testing only), GREM1 (promoter region deletion/duplication testing only), KIT, MEN1, MLH1, MSH2, MSH3, MSH6, MUTYH, NBN, NF1, NHTL1, PALB2, PDGFRA, PMS2, POLD1, POLE, PTEN, RAD50, RAD51C, RAD51D, RNF43, SDHB, SDHC, SDHD, SMAD4, SMARCA4. STK11, TP53, TSC1, TSC2, and VHL.  The following genes were evaluated for sequence changes only: SDHA and HOXB13 c.251G>A variant only. The report date is Apr 06, 2019.   04/20/2019 Surgery   Right lumpectomy ([facility]): DCIS, 1.5cm, intermediate grade, ER+ 95%, PR+ 95%, clear margins.    04/20/2019 Cancer Staging   Staging form: Breast, AJCC 8th Edition - Pathologic  stage from 04/20/2019: Stage 0 (pTis (DCIS), pN0, cM0, ER+, PR+) - Signed by Crawford Morna Pickle, NP on 10/23/2019   05/31/2019 -  Radiation Therapy   Adjuvant XRT   08/2019 -  Anti-estrogen oral therapy   Tamoxifen  daily     CHIEF COMPLIANT: Follow-up on tamoxifen  therapy  HISTORY OF PRESENT ILLNESS:  History of Present Illness Nicole Schwartz is a 53 year old female who presents for completion of tamoxifen  therapy after five years of treatment.  She has completed five years of tamoxifen  therapy for estrogen receptor-positive breast cancer without any issues over the past year. She anticipates an improvement in energy and strength following the cessation of tamoxifen .  She is experiencing difficulty with weight loss despite efforts, and insurance has not covered GLP-1 medications, which she believes could aid in weight management.  Her mammograms are current, with the last one in July showing a breast density of B, which is favorable for mammogram accuracy.     ALLERGIES:  has no known allergies.  MEDICATIONS:  Current Outpatient Medications  Medication Sig Dispense Refill   diclofenac  (VOLTAREN ) 75 MG EC tablet Take 1 tablet (75 mg total) by mouth 2 (two) times daily. 180 tablet 1   fenofibrate  (TRICOR ) 145 MG tablet Take 1 tablet (145 mg total) by mouth daily. 90 tablet 3   hydroxychloroquine  (PLAQUENIL ) 200 MG tablet Take 1 tablet (200 mg total) by mouth 2 (two) times daily with food or milk. 180 tablet 1   levothyroxine  (SYNTHROID ) 75 MCG tablet Take 1 tablet (75 mcg total) by mouth daily before breakfast. 90 tablet 3   lisinopril -hydrochlorothiazide  (ZESTORETIC ) 10-12.5 MG tablet Take 1 tablet by mouth daily. 90 tablet 3   metFORMIN  (GLUCOPHAGE -XR) 750 MG 24 hr tablet  Take 2 tablets (1,500 mg total) by mouth daily. 180 tablet 1   tamoxifen  (NOLVADEX ) 20 MG tablet Take 1 tablet (20 mg total) by mouth daily. 90 tablet 3   No current facility-administered medications  for this visit.    PHYSICAL EXAMINATION: ECOG PERFORMANCE STATUS: 1 - Symptomatic but completely ambulatory  Vitals:   08/15/24 1526  BP: 121/63  Pulse: 77  Resp: 16  Temp: 98.3 F (36.8 C)  SpO2: 98%   Filed Weights   08/15/24 1526  Weight: 235 lb 1.6 oz (106.6 kg)      LABORATORY DATA:  I have reviewed the data as listed    Latest Ref Rng & Units 11/02/2023   11:36 AM 04/29/2023   11:06 AM 10/27/2022   10:59 AM  CMP  Glucose 65 - 99 mg/dL 94  85  84   BUN 7 - 25 mg/dL 14  18  14    Creatinine 0.50 - 1.03 mg/dL 9.28  9.28  9.28   Sodium 135 - 146 mmol/L 138  139  134   Potassium 3.5 - 5.3 mmol/L 3.7  3.9  3.7   Chloride 98 - 110 mmol/L 102  104  99   CO2 20 - 32 mmol/L 27  28  26    Calcium  8.6 - 10.4 mg/dL 9.6  9.2  9.3   Total Protein 6.1 - 8.1 g/dL 6.8  6.8  7.0   Total Bilirubin 0.2 - 1.2 mg/dL 0.3  0.3  0.3   Alkaline Phos 39 - 117 U/L  31  31   AST 10 - 35 U/L 16  15  14    ALT 6 - 29 U/L 14  14  12      Lab Results  Component Value Date   WBC 5.4 04/15/2019   HGB 14.3 04/15/2019   HCT 41.6 04/15/2019   MCV 86.1 04/15/2019   PLT 281 04/15/2019    ASSESSMENT & PLAN:  Malignant neoplasm of upper-outer quadrant of right breast in female, estrogen receptor positive (HCC) Antiestrogen treatment plan3/31/2020:Diagnostic mammogram detected 2.5cm calcifications in right breast. 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    Treatment plan: 1. Adjuvant radiation therapy started 05/31/2019 2. Followed by antiestrogen therapy with tamoxifen  5 years, started 08/02/2019 completed September 2025   Tamoxifen  toxicities:   1.  Mild hot flashes: 2. endometrial hyperplasia: Follows with GYN     Breast cancer surveillance: 1.  Breast exam 08/15/2024: Benign 2. mammogram 06/10/2023 at [facility]: Benign breast density category B   Weight  issues: Her insurance does not cover GLP-1 antagonist.  She is working hard to lose weight by exercising regularly. She is hoping that since tamoxifen  is going to be discontinued she might have a better chance of losing weight.  Return to clinic on an as-needed basis.     No orders of the defined types were placed in this encounter.  The patient has a good understanding of the overall plan. she agrees with it. she will call with any problems that may develop before the next visit here. Total time spent: 30 mins including face to face time and time spent for planning, charting and co-ordination of care   Naomi MARLA Chad, MD 08/15/24

## 2024-08-15 NOTE — Assessment & Plan Note (Signed)
 Antiestrogen treatment plan3/31/2020:Diagnostic mammogram detected 2.5cm calcifications in right breast. 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    Treatment plan: 1. Adjuvant radiation therapy started 05/31/2019 2. Followed by antiestrogen therapy with tamoxifen  5 years, started 08/02/2019   Tamoxifen  toxicities:   1.  Mild hot flashes: 2. endometrial hyperplasia: She is going to get a D&C hysteroscopy performed by her gynecologist for further evaluation.     Breast cancer surveillance: 1.  Breast exam 08/15/2024: Benign 2. mammogram 06/10/2023 at [facility]: Benign breast density category B   Weight issues: Her insurance does not cover GLP-1 antagonist.  She is working hard to lose weight by exercising regularly.   Return to clinic in 1 year for follow-up

## 2024-10-11 ENCOUNTER — Other Ambulatory Visit (HOSPITAL_COMMUNITY): Payer: Self-pay

## 2024-10-11 MED ORDER — PREDNISONE 5 MG PO TABS
ORAL_TABLET | ORAL | 0 refills | Status: DC
  Filled 2024-10-11: qty 48, 12d supply, fill #0

## 2024-10-20 ENCOUNTER — Other Ambulatory Visit: Payer: Self-pay

## 2024-10-20 ENCOUNTER — Other Ambulatory Visit (HOSPITAL_COMMUNITY): Payer: Self-pay

## 2024-10-20 DIAGNOSIS — M0589 Other rheumatoid arthritis with rheumatoid factor of multiple sites: Secondary | ICD-10-CM | POA: Diagnosis not present

## 2024-10-20 DIAGNOSIS — Z79899 Other long term (current) drug therapy: Secondary | ICD-10-CM | POA: Diagnosis not present

## 2024-10-20 DIAGNOSIS — Z6841 Body Mass Index (BMI) 40.0 and over, adult: Secondary | ICD-10-CM | POA: Diagnosis not present

## 2024-10-20 MED ORDER — DICLOFENAC SODIUM 75 MG PO TBEC
75.0000 mg | DELAYED_RELEASE_TABLET | Freq: Two times a day (BID) | ORAL | 1 refills | Status: AC
  Filled 2024-10-20 – 2024-11-08 (×2): qty 180, 90d supply, fill #0

## 2024-10-20 MED ORDER — HYDROXYCHLOROQUINE SULFATE 200 MG PO TABS
200.0000 mg | ORAL_TABLET | Freq: Two times a day (BID) | ORAL | 1 refills | Status: AC
  Filled 2024-10-20: qty 180, 90d supply, fill #0

## 2024-10-31 ENCOUNTER — Other Ambulatory Visit

## 2024-11-01 ENCOUNTER — Ambulatory Visit: Payer: Self-pay | Admitting: Endocrinology

## 2024-11-02 DIAGNOSIS — Z0000 Encounter for general adult medical examination without abnormal findings: Secondary | ICD-10-CM | POA: Diagnosis not present

## 2024-11-02 DIAGNOSIS — H35033 Hypertensive retinopathy, bilateral: Secondary | ICD-10-CM | POA: Diagnosis not present

## 2024-11-02 DIAGNOSIS — Z853 Personal history of malignant neoplasm of breast: Secondary | ICD-10-CM | POA: Diagnosis not present

## 2024-11-02 DIAGNOSIS — I10 Essential (primary) hypertension: Secondary | ICD-10-CM | POA: Diagnosis not present

## 2024-11-02 DIAGNOSIS — R7303 Prediabetes: Secondary | ICD-10-CM | POA: Diagnosis not present

## 2024-11-02 DIAGNOSIS — E282 Polycystic ovarian syndrome: Secondary | ICD-10-CM | POA: Diagnosis not present

## 2024-11-02 DIAGNOSIS — E785 Hyperlipidemia, unspecified: Secondary | ICD-10-CM | POA: Diagnosis not present

## 2024-11-02 DIAGNOSIS — E039 Hypothyroidism, unspecified: Secondary | ICD-10-CM | POA: Diagnosis not present

## 2024-11-02 LAB — COMPREHENSIVE METABOLIC PANEL WITH GFR
AG Ratio: 2 (calc) (ref 1.0–2.5)
ALT: 28 U/L (ref 6–29)
AST: 24 U/L (ref 10–35)
Albumin: 4.6 g/dL (ref 3.6–5.1)
Alkaline phosphatase (APISO): 35 U/L — ABNORMAL LOW (ref 37–153)
BUN: 15 mg/dL (ref 7–25)
CO2: 31 mmol/L (ref 20–32)
Calcium: 10.2 mg/dL (ref 8.6–10.4)
Chloride: 102 mmol/L (ref 98–110)
Creat: 0.8 mg/dL (ref 0.50–1.03)
Globulin: 2.3 g/dL (ref 1.9–3.7)
Glucose, Bld: 87 mg/dL (ref 65–99)
Potassium: 4 mmol/L (ref 3.5–5.3)
Sodium: 138 mmol/L (ref 135–146)
Total Bilirubin: 0.4 mg/dL (ref 0.2–1.2)
Total Protein: 6.9 g/dL (ref 6.1–8.1)
eGFR: 89 mL/min/1.73m2 (ref >=60)

## 2024-11-02 LAB — HEMOGLOBIN A1C
Hgb A1c MFr Bld: 5.4 % (ref <5.7)
Mean Plasma Glucose: 108 mg/dL
eAG (mmol/L): 6 mmol/L

## 2024-11-02 LAB — LIPID PANEL
Cholesterol: 169 mg/dL (ref <200)
HDL: 51 mg/dL (ref >=50)
LDL Cholesterol (Calc): 93 mg/dL
Non-HDL Cholesterol (Calc): 118 mg/dL (ref <130)
Total CHOL/HDL Ratio: 3.3 (calc) (ref <5.0)
Triglycerides: 151 mg/dL — ABNORMAL HIGH (ref <150)

## 2024-11-02 LAB — PARATHYROID HORMONE, INTACT (NO CA): PTH: 36 pg/mL (ref 16–77)

## 2024-11-02 LAB — ALKALINE PHOSPHATASE, BONE SPECIFIC: ALKALINE PHOSPHATASE, BONE SPECIFIC: 6.7 ug/L (ref 5.6–29.0)

## 2024-11-02 LAB — PHOSPHORUS: Phosphorus: 4.6 mg/dL — ABNORMAL HIGH (ref 2.5–4.5)

## 2024-11-02 LAB — VITAMIN D 25 HYDROXY (VIT D DEFICIENCY, FRACTURES): Vit D, 25-Hydroxy: 23 ng/mL — ABNORMAL LOW (ref 30–100)

## 2024-11-08 ENCOUNTER — Other Ambulatory Visit (HOSPITAL_COMMUNITY): Payer: Self-pay

## 2024-11-08 ENCOUNTER — Other Ambulatory Visit: Payer: Self-pay | Admitting: Endocrinology

## 2024-11-08 ENCOUNTER — Ambulatory Visit: Admitting: Endocrinology

## 2024-11-08 MED ORDER — METFORMIN HCL ER 750 MG PO TB24
1500.0000 mg | ORAL_TABLET | Freq: Every day | ORAL | 1 refills | Status: AC
  Filled 2024-11-08: qty 180, 90d supply, fill #0

## 2024-11-10 ENCOUNTER — Ambulatory Visit: Admitting: Endocrinology

## 2024-11-10 ENCOUNTER — Encounter: Payer: Self-pay | Admitting: Endocrinology

## 2024-11-10 ENCOUNTER — Other Ambulatory Visit (HOSPITAL_COMMUNITY): Payer: Self-pay

## 2024-11-10 VITALS — BP 154/90 | Ht 61.5 in | Wt 241.0 lb

## 2024-11-10 DIAGNOSIS — R748 Abnormal levels of other serum enzymes: Secondary | ICD-10-CM | POA: Diagnosis not present

## 2024-11-10 DIAGNOSIS — R7301 Impaired fasting glucose: Secondary | ICD-10-CM | POA: Diagnosis not present

## 2024-11-10 DIAGNOSIS — E782 Mixed hyperlipidemia: Secondary | ICD-10-CM | POA: Diagnosis not present

## 2024-11-10 DIAGNOSIS — E063 Autoimmune thyroiditis: Secondary | ICD-10-CM | POA: Diagnosis not present

## 2024-11-10 DIAGNOSIS — R7303 Prediabetes: Secondary | ICD-10-CM

## 2024-11-10 DIAGNOSIS — E282 Polycystic ovarian syndrome: Secondary | ICD-10-CM | POA: Diagnosis not present

## 2024-11-10 DIAGNOSIS — I10 Essential (primary) hypertension: Secondary | ICD-10-CM | POA: Diagnosis not present

## 2024-11-10 DIAGNOSIS — E559 Vitamin D deficiency, unspecified: Secondary | ICD-10-CM

## 2024-11-10 MED ORDER — FENOFIBRATE 145 MG PO TABS: 145.0000 mg | ORAL_TABLET | Freq: Every day | ORAL | 3 refills | Status: AC

## 2024-11-10 NOTE — Progress Notes (Signed)
 Outpatient Endocrinology Note Nicole Dralle, MD  11/10/2024  Patient's Name: Nicole Schwartz    DOB: 05-23-1971    MRN: [id number]  REASON OF VISIT: Follow-up for hypothyroidism  PCP: Redmon, Noelle, PA  HISTORY OF PRESENT ILLNESS:   [name] is a 53 y.o. old female with past medical history as listed below is presented for a follow up for hypothyroidism / impaired fasting glucose prediabetes / obesity / hyperlipidemia.  Pertinent History: Patient was previously seen by Dr. Von and was last time seen in June 2024.  # Primary hypothyroidism -Patient has had a goiter since at least 1998, baseline TSH was 5.4 when was started on levothyroxine  around 2000.  # Obesity : In 07/2015 she was started on Belviq and with this she had previously lost over 20 pounds with baseline weight of 258 pounds. However this was stopped because of lack of insurance coverage. Ozempic  was denied by her insurance despite history of impaired fasting glucose. She was taking phentermine  since 11/07/2020. Since she felt jittery with this she was then taking this every other day. However appears that she did not have a refill and did not request one in late 2023. She was also taking 25 mg topiramate  every day which helps her better control her appetite; again for some reason she did not request a refill and has not taken for several months, later stopped as it made her sedated.   She is still able to reduce portions and carbohydrates and maintain her weight.  # Impaired fasting glucose / PCOS / Prediabetes -She has been on metformin  extended release 750 mg 2 tablets in the evening daily.  Hemoglobin A1c has been in the normal range consistently.  # Hyperlipidemia -She was previously on Crestor  and fenofibrate  combination.  She has been lately on fenofibrate .  LDL has been consistently less than 100.  # Hypertension She has had hypertension since at least 2004. Has been on ACE inhibitor since about  2005. Blood pressure is previously controlled with lisinopril  hydrochlorothiazide . However HCTZ was stopped in 5/23 because of relatively low blood pressure.   Lisinopril  was increased to 20 mg in 12/23. She does check her blood pressure at work or at home and it is usually 130-140 systolic.  Lately she has been on lisinopril  Hydrochlorothiazide  10-12.5 mg daily since May/June 2024.   Does not see a PCP regularly   Interval history Recent laboratory results reviewed.  Normal electrolytes, renal function.  Alkaline phosphatase is mildly low is stable and mild elevated phosphorus.  She has normal bone specific alkaline phosphatase.  She has normal parathyroid  hormone.  She has normal serum calcium .  Vitamin D  is low 23.  She has normal hemoglobin A1c.  Lipids level acceptable.  Overall feeling normal energy.  She has been taking levothyroxine  75 mcg daily.  She does not take vitamin D  supplement.  She has been taking metformin .  She has no other complaints today.  REVIEW OF SYSTEMS:  As per history of present illness.   PAST MEDICAL HISTORY: Past Medical History:  Diagnosis Date   Arthritis    RA   Breast cancer (HCC) 01/2019   Right Breast Cancer   Family history of breast cancer    Hypertension    Hypothyroidism    Personal history of radiation therapy 2020   Right Breast Cancer    PAST SURGICAL HISTORY: Past Surgical History:  Procedure Laterality Date   BREAST LUMPECTOMY Right 04/20/2019   BREAST LUMPECTOMY WITH RADIOACTIVE SEED  LOCALIZATION Right 04/20/2019   Procedure: RIGHT BREAST LUMPECTOMY WITH RADIOACTIVE SEED LOCALIZATION;  Surgeon: Vanderbilt Ned, MD;  Location: [facility];  Service: General;  Laterality: Right;   CESAREAN SECTION     x2    ALLERGIES: No Known Allergies  FAMILY HISTORY:  Family History  Problem Relation Age of Onset   Thyroid  disease Mother    Diabetes Maternal Grandfather    Hypertension Maternal Grandfather    Diabetes  Maternal Grandmother    Breast cancer Paternal Grandmother     SOCIAL HISTORY: Social History   Socioeconomic History   Marital status: Married    Spouse name: Not on file   Number of children: Not on file   Years of education: Not on file   Highest education level: Not on file  Occupational History   Not on file  Tobacco Use   Smoking status: Never   Smokeless tobacco: Never  Vaping Use   Vaping status: Never Used  Substance and Sexual Activity   Alcohol use: Not Currently   Drug use: Never   Sexual activity: Yes    Comment: nothing husband unable to produce children  Other Topics Concern   Not on file  Social History Narrative   Not on file   Social Drivers of Health   Tobacco Use: Low Risk (11/10/2024)   Patient History    Smoking Tobacco Use: Never    Smokeless Tobacco Use: Never    Passive Exposure: Not on file  Financial Resource Strain: Not on file  Food Insecurity: Not on file  Transportation Needs: Not on file  Physical Activity: Not on file  Stress: Not on file  Social Connections: Not on file  Depression (EYV7-0): Not on file  Alcohol Screen: Not on file  Housing: Not on file  Utilities: Not on file  Health Literacy: Not on file    MEDICATIONS:  Current Outpatient Medications  Medication Sig Dispense Refill   diclofenac  (VOLTAREN ) 75 MG EC tablet Take 1 tablet (75 mg total) by mouth 2 (two) times daily. 180 tablet 1   fenofibrate  (TRICOR ) 145 MG tablet Take 1 tablet (145 mg total) by mouth daily. 90 tablet 3   hydroxychloroquine  (PLAQUENIL ) 200 MG tablet Take 1 tablet (200 mg total) by mouth 2 (two) times daily with food or milk 180 tablet 1   levothyroxine  (SYNTHROID ) 75 MCG tablet Take 1 tablet (75 mcg total) by mouth daily before breakfast. 90 tablet 3   lisinopril -hydrochlorothiazide  (ZESTORETIC ) 10-12.5 MG tablet Take 1 tablet by mouth daily. 90 tablet 3   metFORMIN  (GLUCOPHAGE -XR) 750 MG 24 hr tablet Take 2 tablets (1,500 mg total) by mouth  daily. 180 tablet 1   No current facility-administered medications for this visit.    PHYSICAL EXAM: Vitals:   11/10/24 1551  BP: (!) 154/90  Weight: 241 lb (109.3 kg)  Height: 5' 1.5 (1.562 m)    Body mass index is 44.8 kg/m.  Wt Readings from Last 3 Encounters:  11/10/24 241 lb (109.3 kg)  08/15/24 235 lb 1.6 oz (106.6 kg)  11/09/23 238 lb 9.6 oz (108.2 kg)    General: Well developed, well nourished female in no apparent distress.  HEENT: AT/[place], no external lesions. Hearing intact to the spoken word Eyes:  Conjunctiva clear and no icterus. Neck: Trachea midline, neck supple  Neurologic: Alert, oriented, normal speech Extremities: No pedal pitting edema, no tremors of outstretched hands Skin: Warm, color good.  Psychiatric: Does not appear depressed or anxious  PERTINENT  HISTORIC LABORATORY AND IMAGING STUDIES:  All pertinent laboratory results were reviewed. Please see HPI also for further details.   TSH  Date Value Ref Range Status  05/04/2024 1.34 mIU/L Final    Comment:              Reference Range .           > or = 20 Years  0.40-4.50 .                Pregnancy Ranges           First trimester    0.26-2.66           Second trimester   0.55-2.73           Third trimester    0.43-2.91   11/02/2023 1.12 mIU/L Final    Comment:              Reference Range .           > or = 20 Years  0.40-4.50 .                Pregnancy Ranges           First trimester    0.26-2.66           Second trimester   0.55-2.73           Third trimester    0.43-2.91   04/29/2023 1.39 0.35 - 5.50 uIU/mL Final     ASSESSMENT / PLAN  1. Acquired autoimmune hypothyroidism   2. Impaired fasting glucose   3. Prediabetes   4. Vitamin D  deficiency   5. Low serum alkaline phosphatase   6. PCOS (polycystic ovarian syndrome)   7. Mixed hyperlipidemia   8. Essential hypertension    Recent laboratory results reviewed.  She has low vitamin D  otherwise all the test results are in  acceptable range.  She has prediabetes/hypothyroidism/hyperlipidemia and hypertension.  She now has vitamin D  deficiency based on lab result.  She has not been currently taking vitamin D  supplement.   Plan: -Start vitamin D3 over-the-counter 2000 international unit daily. --Continue levothyroxine  75 mcg daily. -Continue fenofibrate  145 mg daily. -Continue metformin  ER 750 mg 2 tablets daily. -Continue on lisinopril /hydrochlorothiazide  10/12.5 mg daily.  Patient had taken phentermine  and Topamax  in the past but stopped due to side effect as mentioned in HPI.  Patient states her medical insurance does not cover weight loss medication.  Consider referral to weight management clinic however as medication are not covered, probably is not beneficial.  # Low alkaline phosphatase -Mildly low serum alkaline phosphatase - She has normal bone specific alkaline phosphatase, normal parathyroid  hormone, serum calcium .  Will continue to monitor.  Hailynn was seen today for medical management of chronic issues.  Diagnoses and all orders for this visit:  Acquired autoimmune hypothyroidism -     T4, free -     TSH  Impaired fasting glucose  Prediabetes -     Hemoglobin A1c  Vitamin D  deficiency -     VITAMIN D  25 Hydroxy (Vit-D Deficiency, Fractures)  Low serum alkaline phosphatase  PCOS (polycystic ovarian syndrome)  Mixed hyperlipidemia  Essential hypertension    DISPOSITION Follow up in clinic in 6 months suggested.  Labs prior to follow-up visit as ordered.  All questions answered and patient verbalized understanding of the plan.  Larz Mark, MD [facility] Endocr[facility]ology [facility] [address] [place], [address] [place], [place] [address] Phone # [phone]  At least part of this note was generated using voice recognition software. Inadvertent word errors may have occurred, which were not recognized during the proofreading process.

## 2024-11-10 NOTE — Patient Instructions (Signed)
 Vit D3 over the counter 2000 international units daily.

## 2024-11-11 ENCOUNTER — Other Ambulatory Visit (HOSPITAL_COMMUNITY): Payer: Self-pay

## 2024-11-21 ENCOUNTER — Other Ambulatory Visit: Payer: Self-pay

## 2024-11-21 ENCOUNTER — Other Ambulatory Visit (HOSPITAL_COMMUNITY): Payer: Self-pay

## 2024-12-29 ENCOUNTER — Other Ambulatory Visit (HOSPITAL_COMMUNITY): Payer: Self-pay

## 2024-12-29 MED ORDER — PREDNISONE 5 MG PO TABS
ORAL_TABLET | ORAL | 0 refills | Status: AC
  Filled 2024-12-29: qty 48, 12d supply, fill #0

## 2025-05-09 ENCOUNTER — Other Ambulatory Visit

## 2025-05-11 ENCOUNTER — Ambulatory Visit: Admitting: Endocrinology
